# Patient Record
Sex: Female | Born: 1935 | Race: White | Hispanic: No | Marital: Married | Smoking: Never smoker
Health system: Southern US, Community
[De-identification: ages and names within clinical notes are randomized; demographics above are authoritative.]

## PROBLEM LIST (undated history)

## (undated) DIAGNOSIS — Z8679 Personal history of other diseases of the circulatory system: Secondary | ICD-10-CM

## (undated) DIAGNOSIS — E669 Obesity, unspecified: Secondary | ICD-10-CM

## (undated) DIAGNOSIS — R112 Nausea with vomiting, unspecified: Secondary | ICD-10-CM

## (undated) DIAGNOSIS — K219 Gastro-esophageal reflux disease without esophagitis: Secondary | ICD-10-CM

## (undated) DIAGNOSIS — I10 Essential (primary) hypertension: Secondary | ICD-10-CM

## (undated) DIAGNOSIS — R002 Palpitations: Secondary | ICD-10-CM

## (undated) DIAGNOSIS — E782 Mixed hyperlipidemia: Secondary | ICD-10-CM

## (undated) DIAGNOSIS — M1990 Unspecified osteoarthritis, unspecified site: Secondary | ICD-10-CM

## (undated) DIAGNOSIS — K589 Irritable bowel syndrome without diarrhea: Secondary | ICD-10-CM

## (undated) DIAGNOSIS — D708 Other neutropenia: Secondary | ICD-10-CM

## (undated) DIAGNOSIS — I679 Cerebrovascular disease, unspecified: Secondary | ICD-10-CM

## (undated) DIAGNOSIS — I2510 Atherosclerotic heart disease of native coronary artery without angina pectoris: Secondary | ICD-10-CM

## (undated) DIAGNOSIS — K5792 Diverticulitis of intestine, part unspecified, without perforation or abscess without bleeding: Secondary | ICD-10-CM

## (undated) DIAGNOSIS — I499 Cardiac arrhythmia, unspecified: Secondary | ICD-10-CM

## (undated) DIAGNOSIS — F419 Anxiety disorder, unspecified: Secondary | ICD-10-CM

## (undated) DIAGNOSIS — I4891 Unspecified atrial fibrillation: Secondary | ICD-10-CM

## (undated) DIAGNOSIS — Z9889 Other specified postprocedural states: Secondary | ICD-10-CM

## (undated) HISTORY — DX: Irritable bowel syndrome, unspecified: K58.9

## (undated) HISTORY — PX: CARPAL TUNNEL RELEASE: SHX101

## (undated) HISTORY — DX: Essential (primary) hypertension: I10

## (undated) HISTORY — DX: Unspecified atrial fibrillation: I48.91

## (undated) HISTORY — DX: Personal history of other diseases of the circulatory system: Z86.79

## (undated) HISTORY — PX: TOTAL KNEE ARTHROPLASTY: SHX125

## (undated) HISTORY — DX: Gastro-esophageal reflux disease without esophagitis: K21.9

## (undated) HISTORY — DX: Cerebrovascular disease, unspecified: I67.9

## (undated) HISTORY — PX: CORONARY ANGIOPLASTY: SHX604

## (undated) HISTORY — DX: Palpitations: R00.2

## (undated) HISTORY — DX: Anxiety disorder, unspecified: F41.9

## (undated) HISTORY — DX: Obesity, unspecified: E66.9

## (undated) HISTORY — PX: KNEE ARTHROSCOPY: SUR90

## (undated) HISTORY — PX: APPENDECTOMY: SHX54

## (undated) HISTORY — PX: OTHER SURGICAL HISTORY: SHX169

## (undated) HISTORY — DX: Mixed hyperlipidemia: E78.2

## (undated) HISTORY — PX: CHOLECYSTECTOMY: SHX55

## (undated) HISTORY — DX: Other neutropenia: D70.8

## (undated) HISTORY — DX: Unspecified osteoarthritis, unspecified site: M19.90

## (undated) HISTORY — DX: Atherosclerotic heart disease of native coronary artery without angina pectoris: I25.10

## (undated) HISTORY — PX: BACK SURGERY: SHX140

---

## 2004-03-26 ENCOUNTER — Ambulatory Visit: Payer: Self-pay | Admitting: Internal Medicine

## 2004-04-03 ENCOUNTER — Ambulatory Visit: Payer: Self-pay | Admitting: Internal Medicine

## 2004-04-14 ENCOUNTER — Ambulatory Visit (HOSPITAL_COMMUNITY): Admission: RE | Admit: 2004-04-14 | Discharge: 2004-04-14 | Payer: Self-pay | Admitting: Internal Medicine

## 2004-05-19 ENCOUNTER — Ambulatory Visit (HOSPITAL_COMMUNITY): Admission: RE | Admit: 2004-05-19 | Discharge: 2004-05-19 | Payer: Self-pay | Admitting: Internal Medicine

## 2004-07-20 ENCOUNTER — Ambulatory Visit: Payer: Self-pay | Admitting: Internal Medicine

## 2005-02-16 ENCOUNTER — Ambulatory Visit: Payer: Self-pay | Admitting: Internal Medicine

## 2005-04-22 ENCOUNTER — Ambulatory Visit: Payer: Self-pay | Admitting: Cardiology

## 2005-05-06 ENCOUNTER — Ambulatory Visit: Payer: Self-pay | Admitting: Cardiology

## 2005-07-12 ENCOUNTER — Ambulatory Visit: Payer: Self-pay | Admitting: Internal Medicine

## 2005-12-02 ENCOUNTER — Ambulatory Visit: Payer: Self-pay | Admitting: Internal Medicine

## 2005-12-13 ENCOUNTER — Encounter (INDEPENDENT_AMBULATORY_CARE_PROVIDER_SITE_OTHER): Payer: Self-pay | Admitting: *Deleted

## 2005-12-13 ENCOUNTER — Ambulatory Visit (HOSPITAL_COMMUNITY): Admission: RE | Admit: 2005-12-13 | Discharge: 2005-12-13 | Payer: Self-pay | Admitting: Internal Medicine

## 2005-12-13 ENCOUNTER — Ambulatory Visit: Payer: Self-pay | Admitting: Internal Medicine

## 2005-12-22 ENCOUNTER — Ambulatory Visit: Payer: Self-pay | Admitting: Internal Medicine

## 2006-01-19 ENCOUNTER — Ambulatory Visit: Payer: Self-pay | Admitting: Internal Medicine

## 2006-02-03 ENCOUNTER — Ambulatory Visit: Payer: Self-pay | Admitting: Internal Medicine

## 2006-06-24 ENCOUNTER — Ambulatory Visit: Payer: Self-pay | Admitting: Internal Medicine

## 2006-07-14 ENCOUNTER — Ambulatory Visit: Payer: Self-pay | Admitting: Cardiology

## 2006-12-08 ENCOUNTER — Ambulatory Visit: Payer: Self-pay | Admitting: Cardiology

## 2006-12-13 ENCOUNTER — Ambulatory Visit: Payer: Self-pay | Admitting: Cardiology

## 2007-01-01 ENCOUNTER — Ambulatory Visit: Payer: Self-pay | Admitting: Cardiology

## 2007-01-02 ENCOUNTER — Ambulatory Visit: Payer: Self-pay | Admitting: Cardiology

## 2007-01-10 ENCOUNTER — Ambulatory Visit: Payer: Self-pay | Admitting: Cardiology

## 2007-01-24 ENCOUNTER — Ambulatory Visit: Payer: Self-pay | Admitting: Cardiology

## 2007-02-08 ENCOUNTER — Ambulatory Visit: Payer: Self-pay | Admitting: Cardiology

## 2007-04-03 ENCOUNTER — Ambulatory Visit: Payer: Self-pay | Admitting: Cardiology

## 2007-10-30 ENCOUNTER — Ambulatory Visit: Payer: Self-pay | Admitting: Cardiology

## 2008-01-20 ENCOUNTER — Ambulatory Visit: Payer: Self-pay | Admitting: Cardiology

## 2008-02-07 ENCOUNTER — Ambulatory Visit: Payer: Self-pay | Admitting: Physician Assistant

## 2008-08-21 ENCOUNTER — Encounter: Payer: Self-pay | Admitting: Cardiology

## 2008-09-16 ENCOUNTER — Ambulatory Visit: Payer: Self-pay | Admitting: Cardiology

## 2008-11-27 ENCOUNTER — Encounter: Payer: Self-pay | Admitting: Cardiology

## 2008-11-28 ENCOUNTER — Encounter: Payer: Self-pay | Admitting: Cardiology

## 2009-04-18 ENCOUNTER — Encounter: Payer: Self-pay | Admitting: Cardiology

## 2009-04-21 ENCOUNTER — Ambulatory Visit: Payer: Self-pay | Admitting: Cardiology

## 2009-04-21 DIAGNOSIS — Z8719 Personal history of other diseases of the digestive system: Secondary | ICD-10-CM

## 2009-04-21 DIAGNOSIS — I10 Essential (primary) hypertension: Secondary | ICD-10-CM | POA: Insufficient documentation

## 2009-04-21 DIAGNOSIS — Z8679 Personal history of other diseases of the circulatory system: Secondary | ICD-10-CM | POA: Insufficient documentation

## 2009-04-21 DIAGNOSIS — Z9861 Coronary angioplasty status: Secondary | ICD-10-CM

## 2009-04-21 DIAGNOSIS — I2510 Atherosclerotic heart disease of native coronary artery without angina pectoris: Secondary | ICD-10-CM

## 2009-04-22 ENCOUNTER — Encounter: Payer: Self-pay | Admitting: Cardiology

## 2009-05-02 ENCOUNTER — Encounter: Payer: Self-pay | Admitting: Cardiology

## 2009-08-22 ENCOUNTER — Encounter: Payer: Self-pay | Admitting: Cardiology

## 2009-08-27 ENCOUNTER — Encounter: Payer: Self-pay | Admitting: Cardiology

## 2009-09-04 ENCOUNTER — Encounter (INDEPENDENT_AMBULATORY_CARE_PROVIDER_SITE_OTHER): Payer: Self-pay | Admitting: *Deleted

## 2009-12-29 ENCOUNTER — Encounter: Payer: Self-pay | Admitting: Cardiology

## 2010-02-10 ENCOUNTER — Ambulatory Visit: Payer: Self-pay | Admitting: Internal Medicine

## 2010-03-28 ENCOUNTER — Encounter (INDEPENDENT_AMBULATORY_CARE_PROVIDER_SITE_OTHER): Payer: Self-pay | Admitting: Internal Medicine

## 2010-03-29 ENCOUNTER — Encounter (INDEPENDENT_AMBULATORY_CARE_PROVIDER_SITE_OTHER): Payer: Self-pay | Admitting: Internal Medicine

## 2010-04-07 NOTE — Miscellaneous (Signed)
Summary: LIPID PANEL  Clinical Lists Changes  Orders: Added new Test order of T-Lipid Profile [id number]) - Signed

## 2010-04-07 NOTE — Assessment & Plan Note (Signed)
Summary: 6 MO FU PER JAN REMINDER-SRS   Visit Type:  Follow-up Primary Provider:  Dimas Aguas  CC:  follow-up visit.  History of Present Illness: the patient is a 77 year old female with a history of coronary artery disease, supraventricular tachycardia and nonobstructive cerebrovascular disease. The patient had abnormal Cardiolite in 2009 with an ejection fraction of 70%. The patient hasproblems with irritable bowel syndrome. She also has a large hiatal hernia.her symptoms have improved greatly with Nexium. She also states that she has no recurrent palpitations. She has been under significant stress last November 23, 2022 when her sister died. She was unable to go to the funeral because of significant anxiety. From a cardiovascular standpoint however the patient is stable. She denies any palpitations, chest pain shortness of breath orthopnea or PND.   Preventive Screening-Counseling & Management  Alcohol-Tobacco     Smoking Status: never  Current Medications (verified): 1)  Diazepam 5 Mg Tabs (Diazepam) .... Take 1 Tablet By Mouth Once A Day 2)  Lisinopril 40 Mg Tabs (Lisinopril) .... Take 1 Tablet By Mouth Once A Day 3)  Metoprolol Succinate 50 Mg Xr24h-Tab (Metoprolol Succinate) .... Take 1 Tablet By Mouth Once A Day 4)  Simvastatin 20 Mg Tabs (Simvastatin) .... Take 1 Tablet By Mouth At Bedtime 5)  Aspirin Ec 325 Mg Tbec (Aspirin) .... Take One Tablet By Mouth Daily 6)  Meclizine Hcl 25 Mg Tabs (Meclizine Hcl) .... Take 1/2-1 Tablet By Mouth Three Times A Day As Needed Dizziness 7)  Nexium 40 Mg Cpdr (Esomeprazole Magnesium) .... Take 1 Tablet By Mouth Once A Day 8)  Niacin 500 Mg Tabs (Niacin) .... Take 2 Tablet By Mouth Once A Day 9)  Dicyclomine Hcl 10 Mg Caps (Dicyclomine Hcl) .... Take 2 Tablet By Mouth Two Times A Day 10)  Tramadol Hcl 50 Mg Tabs (Tramadol Hcl) .... Take 1 Tablet By Mouth Three Times A Day  Allergies (verified): 1)  ! Codeine 2)  ! Morphine  Comments:  Nurse/Medical  Assistant: The patient's medications and allergies were reviewed with the patient and were updated in the Medication and Allergy Lists. Bottles reviewed.  Past History:  Past Medical History: Last updated: 04/24/2008 coronary artery disease A.  status post percutaneous intervention, March 1997 [facility]). B.  status-post coronary artery stenting, later in July, secondary to restenosis [facility]). History of supraventricular tachycardia palpitations mixed dyslipidemia nonobstructive cerebrovascular disease hypertension GERD chronic neutropenia chronic anxiety disorder osteoarthritis irritable bowel syndrome obesity  Past Surgical History: Last updated: 04/24/2008 Appendectomy Cholecystectomy history of right carpal tunnel, bilateral trigger thumb release surgery arthroscopic right knee surgery   Family History: Last updated: 04/24/2008 Family History of Coronary Artery Disease:   Social History: Last updated: 04/24/2008 Tobacco Use - No.  Alcohol Use - no  Risk Factors: Smoking Status: never (04/21/2009)  Review of Systems  The patient denies fatigue, malaise, fever, weight gain/loss, vision loss, decreased hearing, hoarseness, chest pain, palpitations, shortness of breath, prolonged cough, wheezing, sleep apnea, coughing up blood, abdominal pain, blood in stool, nausea, vomiting, diarrhea, heartburn, incontinence, blood in urine, muscle weakness, joint pain, leg swelling, rash, skin lesions, headache, fainting, dizziness, depression, anxiety, enlarged lymph nodes, easy bruising or bleeding, and environmental allergies.    Vital Signs:  Patient profile:   77 year old female Height:      66 inches Weight:      200 pounds BMI:     32.40 Pulse rate:   69 / minute BP sitting:   114 / 76  (left  arm) Cuff size:   regular  Vitals Entered By: Carlye Grippe (April 21, 2009 1:25 PM)  Nutrition Counseling: Patient's BMI is greater than 25 and therefore counseled on weight  management options. CC: follow-up visit   Physical Exam  Additional Exam:  General: Well-developed, well-nourished in no distress head: Normocephalic and atraumatic eyes PERRLA/EOMI intact, conjunctiva and lids normal nose: No deformity or lesions mouth normal dentition, normal posterior pharynx neck: Supple, no JVD.  No masses, thyromegaly or abnormal cervical nodes lungs: Normal breath sounds bilaterally without wheezing.  Normal percussion heart: regular rate and rhythm with normal S1 and S2, no S3 or S4.  PMI is normal.  No pathological murmurs abdomen: Normal bowel sounds, abdomen is soft and nontender without masses, organomegaly or hernias noted.  No hepatosplenomegaly musculoskeletal: Back normal, normal gait muscle strength and tone normal pulsus: Pulse is normal in all 4 extremities Extremities: No peripheral pitting edema neurologic: Alert and oriented x 3 skin: Intact without lesions or rashes cervical nodes: No significant adenopathy psychologic: Normal affect    Impression & Recommendations:  Problem # 1:  CAD (ICD-414.00) the patient has no recurrent chest pain.she had a stress test in 2009. Her updated medication list for this problem includes:    Lisinopril 40 Mg Tabs (Lisinopril) .Marland Kitchen... Take 1 tablet by mouth once a day    Metoprolol Succinate 50 Mg Xr24h-tab (Metoprolol succinate) .Marland Kitchen... Take 1 tablet by mouth once a day    Aspirin Ec 325 Mg Tbec (Aspirin) .Marland Kitchen... Take one tablet by mouth daily  Orders: EKG w/ Interpretation (93000) T-CBC No Diff ([id number]) T-Basic Metabolic Panel ([id number]) T-Lipid Profile ([id number]) T-TSH ([id number])  Problem # 2:  PALPITATIONS, HX OF (ICD-V12.50) patient reports no recurrent palpitations. I made no changes in her medical regimen.  Problem # 3:  ESSENTIAL HYPERTENSION, BENIGN (ICD-401.1) blood pressure is well controlled. Her updated medication list for this problem includes:    Lisinopril 40 Mg Tabs  (Lisinopril) .Marland Kitchen... Take 1 tablet by mouth once a day    Metoprolol Succinate 50 Mg Xr24h-tab (Metoprolol succinate) .Marland Kitchen... Take 1 tablet by mouth once a day    Aspirin Ec 325 Mg Tbec (Aspirin) .Marland Kitchen... Take one tablet by mouth daily  Problem # 4:  IRRITABLE BOWEL SYNDROME, HX OF (ICD-V12.79) Assessment: Comment Only  Patient Instructions: 1)  Labs  2)  Follow up in  1 year

## 2010-04-07 NOTE — Procedures (Signed)
Summary: Holter and Event/ CARDIONET END OF SERVICE SUMMARY REPORT  Holter and Event/ CARDIONET END OF SERVICE SUMMARY REPORT   Imported By: Dorise Hiss 04/21/2009 08:50:14  _____________________________________________________________________  External Attachment:    Type:   Image     Comment:   External Document

## 2010-04-07 NOTE — Miscellaneous (Signed)
Summary: ECG  Clinical Lists Changes  Observations: Added new observation of EKG INTERP: NSR 61 bpm. NSTT (04/21/2009 15:35)      EKG  Procedure date:  04/21/2009  Findings:      NSR 61 bpm. NSTT

## 2010-04-07 NOTE — Letter (Signed)
Summary: Engineer, materials at [facility]  [address]   [place], [place] [address]   Phone: [phone]  Fax: [phone]        September 04, 2009 MRN: [id number]   [facility] [place] [address] [place], [place]  [address]   Dear Ms. Dever,  Your test ordered by Selena Batten has been reviewed by your physician (or physician assistant) and was found to be normal or stable. Your physician (or physician assistant) felt no changes were needed at this time.  ____ Echocardiogram  ____ Cardiac Stress Test  __X__ Lab Work  ____ Peripheral vascular study of arms, legs or neck  ____ CT scan or X-ray  ____ Lung or Breathing test  ____ Other:   Thank you.   Hoover Brunette, LPN    Duane Boston, M.D., F.A.C.C. Thressa Sheller, M.D., F.A.C.C. Oneal Grout, M.D., F.A.C.C. Cheree Ditto, M.D., F.A.C.C. Daiva Nakayama, M.D., F.A.C.C. Kenney Houseman, M.D., F.A.C.C. Jeanne Ivan, PA-C

## 2010-04-07 NOTE — Letter (Signed)
Summary: External Correspondence/ OFFICE NOTE DR. HOWARD  External Correspondence/ OFFICE NOTE DR. HOWARD   Imported By: Dorise Hiss 08/26/2009 09:31:50  _____________________________________________________________________  External Attachment:    Type:   Image     Comment:   External Document

## 2010-04-07 NOTE — Letter (Signed)
Summary: External Correspondence/ OFFICE VISIT [facility]  External Correspondence/ OFFICE VISIT [facility]   Imported By: Dorise Hiss 01/02/2010 14:48:02  _____________________________________________________________________  External Attachment:    Type:   Image     Comment:   External Document

## 2010-07-21 NOTE — Assessment & Plan Note (Signed)
[facility]                          [facility] OFFICE NOTE   Dominique Matthews, Dominique Matthews                       MRN:          [id number]  DATE:07/14/2006                            DOB:          09/15/1929    REFERRING PHYSICIAN:  Selinda Flavin   HISTORY OF PRESENT ILLNESS:  The patient is a 77 year old female with a  history of coronary artery disease, a former patient of Dr. Dale [place].  The patient has been doing quite well from a cardiovascular  perspective.  She reports no chest pain, shortness of breath, orthopnea  or PND.  She has had no palpitations or syncope.   Her main concern today in the office focuses about a out of thrush and  possible esophagitis that she had several weeks ago.  She states this  has been somewhat of a recurrent problem.  She is also concerned about  her neutropenia on her previous laboratory work.  In addition, the  patient has asked me questions about her recent LDL cholesterol of 100  and is wanting to go back on a cholesterol-lowering medication.   CURRENT MEDICATIONS:  1. Enteric-coated aspirin 325 mg q.d.  2. Atenolol 25 mg p.o. b.i.d.  3. Diltiazem XR 120 mg p.o. q.d.  4. Diazepam 5 mg p.o. b.i.d. p.r.n.  5. Multivitamin.  6. __________ 0.025 to 0.5 mg 1 tab t.i.d.  7. Prilosec.  8. Trazodone.  9. Detrol LA 4 mg p.o. q.d.   PHYSICAL EXAMINATION:  VITAL SIGNS:  Blood pressure 154/98, heart rate  74 beats per minute, weight 212 pounds.  NECK:  Normal carotid upstrokes.  No carotid bruits.  LUNGS:  Clear breath sounds bilaterally.  HEART:  A regular rate and rhythm.  Normal sinus rhythm.  No murmurs,  rubs or gallops.  ABDOMEN:  Soft, nontender, with no rebound tenderness or guarding.  Good  bowel sounds.  EXTREMITIES:  No clubbing, cyanosis or edema.  NEUROLOGIC:  The patient is alert and oriented, grossly nonfocal.   PROBLEMS:  1. Coronary artery disease      a.     Status post stent in  September 1997.      b.     No recurrence of chest pain, with negative Cardiolite study       in 2007.  2. Dyslipidemia.  3. Palpitations, stable.  4. Hypertension.  5. Neutropenia.   PLAN:  1. The patient is very anxious about her neutropenia, particularly in      light of her recurrent thrush infections.  I have told her that it      is certainly not unreasonable to request a consultation from      hematology.  I referred the patient to Dr. Lynett Fish.  2. The patient also remains extremely anxious about her lipid panel      and although there is no right or wrong answer at this point with      her LDL of 100, I did tell her that it would be okay with me to go      on simvastatin 10  mg p.o. q.h.s.  3. The patient has been advised to follow up with Dr. Selinda Flavin      regarding her blood pressure.  She was not hypertensive during the      last office visit and I do not want to act immediately on one blood      pressure reading, but this should be checked fairly soon.  4. The patient will follow up with [place] in six months.     Learta Codding, MD,FACC  Electronically Signed    GED/MedQ  DD: 07/14/2006  DT: 07/14/2006  Job #: [id number]   cc:   Selinda Flavin

## 2010-07-21 NOTE — Assessment & Plan Note (Signed)
[facility]                          [facility] OFFICE NOTE   EDWENA, MAYORGA                       MRN:          [id number]  DATE:01/02/2007                            DOB:          Feb 12, 1930    REASON FOR VISIT:  Post hospital followup.   HISTORY OF PRESENT ILLNESS:  Ms. Pha is a 77 year old female patient  with a history of coronary artery disease, status post angioplasty in  1997, and stenting in 2007, at [facility] with Dr. Adella Hare.  She was recently seen at [facility] on December 08, 2006, with  palpitations.  She was noted to have ventricular trigeminy on telemetry.  She also apparently has a history of supraventricular tachycardia.  She  was assessed with a 2-D echocardiogram.  This revealed normal LV  function with an EF of 60-65% and no significant valvular abnormalities.  She was discharged to home with plans for outpatient Cardiolite testing  and event monitor.   The patient underwent a stress Cardiolite test on December 13, 2006.  She  achieved 7 METS and exercised for 4 minutes and 41 seconds.  She had no  EKG changes.  She had normal LV perfusion on her images.  Her 30-day  CardioNet event monitor strips have been reviewed.  This basically  reveals sinus rhythm, sinus bradycardia, first-degree AV block and rare  PVCs.  She was symptomatic with one of these PVCs and called those  symptoms in.  Of note, in the hospital, the patient's electrolytes were  normal with potassium of 3.9, creatinine was 0.7.  Her BNP was low at 82  and TSH was normal at 2.43 and her hemoglobin was normal at 13.8.   In the office today, she notes she is doing well.  She continues to have  palpitations from time to time.  She has some concerns that she has been  taken off Diltiazem.  Upon review of her CardioNet monitor strips, her  rates have been in the 50s the majority of the time, just on atenolol 25  mg twice a day.  She denies  any chest heaviness or tightness.  Denies  any significant dyspnea.  She describes NYHA Class II symptoms.  Denies  orthopnea, PND or pedal edema.  Denies any syncope or near syncope.   She does note to me today some fatigue.  She also notes that she does  snore.  She has daytime hypersomnolence.   CURRENT MEDICATIONS:  1. Aspirin 325 mg daily.  2. Atenolol 25 mg b.i.d.  3. Multivitamin daily.  4. Prilosec 20 mg daily.  5. Trazodone 50 mg half tablet b.i.d.  6. Detrol LA 4 mg daily.  7. Diazepam 2.5 mg b.i.d.  8. Simvastatin 20 mg nightly.   ALLERGIES:  MORPHINE AND CODEINE.   PHYSICAL EXAMINATION:  GENERAL:  Well-developed, well-nourished female.  VITAL SIGNS:  Blood pressure 156/94, pulse 65, weight 214.2 pounds.  HEENT:  Normal.  NECK:  Without JVD.  CARDIAC:  S1, S2, regular rate and rhythm without murmurs.  LUNGS:  Clear to auscultation bilaterally without wheezes, rhonchi or  rales.  ABDOMEN:  Soft, nontender with no organomegaly.  EXTREMITIES:  Without edema.  Calves soft and nontender.  NEUROLOGIC:  Alert and oriented x3.  Cranial nerves 2-12 grossly intact.   IMPRESSION:  1. Palpitations, probable symptomatic premature ventricular      contractions.  2. Fatigue, rule out sleep apnea.  3. Coronary artery disease, status post balloon angioplasty in 1997,      followed by coronary stenting in July 2008, secondary to restenosis      at [facility].  4. Nonischemic stress Cardiolite in October 2008.  5. Preserved left ventricular function.  6. History of supraventricular tachycardia.  7. Hypertension.  8. Dyslipidemia.  9. Anxiety.  10.History of leukopenia, evaluated by Dr. Ubaldo Glassing.  11.History of nonobstructive carotid disease by carotid Dopplers in      2005.  12.Gastroesophageal reflux disease.   PLAN:  The patient presents to the office today for followup.  She does  continue to have palpitations.  She does have bradycardia just on   atenolol 25 mg twice a day.  At this point in time, we plan the  following:  1. Discontinue her atenolol and switch her to a different beta-      blocker.  I will start her on Toprol XL 50 mg daily.  2. Initiate lisinopril 10 mg daily for better blood pressure control.      I do not think we should reinitiate her Diltiazem.  She is      bradycardic just on a beta-blocker.  Apparently, she had some low      heart rates in the hospital as well.  3. She will need a followup blood pressure check with the nurse in 7-      10 days as well as a followup BMET.  We will obtain magnesium at      that time.  4. She will be set up for a home apnea link test to rule out sleep      apnea.  5. She will be brought back in followup in the next 3 months or sooner      p.r.n.      Tereso Newcomer, PA-C  Electronically Signed      Learta Codding, MD,FACC  Electronically Signed   SW/MedQ  DD: 01/02/2007  DT: 01/03/2007  Job #: [id number]   cc:   Selinda Flavin

## 2010-07-21 NOTE — Assessment & Plan Note (Signed)
[facility]                          [facility] OFFICE NOTE   BRESHA, HOSACK                       MRN:          [id number]  DATE:04/03/2007                            DOB:          1930/02/22    PRIMARY CARDIOLOGIST:  Learta Codding, MD, [facility]   PRIMARY CARE PHYSICIAN:  Selinda Flavin, MD   REASON FOR VISIT:  Three month followup.   HISTORY OF PRESENT ILLNESS:  Dominique Matthews is a 77 year old female patient  with history of coronary disease and palpitations that have been thought  to be secondary to symptomatic PVCs who presents to the office today for  follow-up.  At last visit, I switched her from atenolol to Toprol and  also initiated lisinopril for better blood pressure control.  Since that  time, she has had hydrochlorothiazide added for better blood pressure  control.  Followup BMETS have been fairly unremarkable.  She has had her  lipid therapy adjusted and her most recent lipid panel revealed a  triglyceride level of 327, total cholesterol 181, HDL 48 and LDL 68.   In the office day, the patient notes she is doing well.  She denies any  chest pain or significant shortness of breath.  Denies any orthopnea,  PND or pedal edema.  She has had significant dizziness.  Of note, she is  currently seeing Dr. Ninetta Lights.  She was referred to him a couple of  months ago by [place] for formal sleep study evaluation to rule out sleep  apnea.  She apparently has vertigo and is undergoing treatment with  prednisone at this point in time per Dr. Rivka Barbara direction. Her  palpitations are less frequent but when she does get them they are quite  significant for her.  She denies any elevated caffeine intake.   CURRENT MEDICATIONS:  1. Fish oil 2 grams daily.  2. Co-enzyme Q 10.  3. Lisinopril/hydrochlorothiazide 20/12.5 mg daily.  4. Metoprolol succinate 50 mg daily.  5. Diazepam 5 mg b.i.d.  6. Simvastatin 40 mg nightly.  7. Prednisone.  8. Meclizine  p.r.n.  9. Aspirin 325 mg daily.  10.Multivitamin daily.  11.Prilosec 20 mg daily.   ALLERGIES:  MORPHINE, CODEINE.   PHYSICAL EXAM:  She is a well-nourished, well-developed female in no  acute distress. Blood pressures 121/75, pulse 74, weight 210.8 pounds.  HEENT is normal.  NECK: No JVD.  CARDIAC:  Normal S1, S2. Regular rate and rhythm without murmur.  LUNGS:  Clear to auscultation bilaterally without wheezing, rhonchi or  rales.  ABDOMEN: Soft. Nontender with normoactive bowel sounds. No organomegaly.  EXTREMITIES: Without edema.  Calves soft, nontender.  SKIN: Warm and dry.  NEUROLOGIC:  She is alert and orient x3.  Cranial nerves II-XII grossly  intact.   Oxygen saturation 97% room air.   IMPRESSION:  1. Palpitations.      a.     Symptomatic PVCs.  2. Fatigue.      a.     Abnormal home Apnea Link test - She has been referred to Dr.       Ninetta Lights for workup  of sleep apnea.  3. Dizziness.      a.     Currently followed by Dr. Ninetta Lights for possible vertigo.  4. Coronary artery disease, status post percutaneous coronary      intervention in 1997 followed by stenting in July of 2008 secondary      to in-stent re-stenosis at [facility].      a.     Nonischemic stress Cardiolite October 2008.  5. Good left ventricular function.  6. History of supraventricular tachycardia.  7. Hypertension.  8. Dyslipidemia.      a.     Elevated triglycerides  9. Anxiety.  10.History of leukopenia, evaluated by Dr. Ubaldo Glassing.  11.History of nonobstructive carotid disease by carotid Dopplers 2005.  12.Gastroesophageal reflux disease.   PLAN:  The patient presents to the office today for follow-up.  Overall,  she is doing well.  She is currently followed by Dr. Ninetta Lights for  dizziness - questionable vertigo.  She does continue to have  palpitations from time to time but they are less severe.  She does have  recent lipid panel that reveals a fairly optimal HDL, optimal  LDL, but  elevated triglycerides.  At this point in time, we plan to:  1. Initiate niacin 500 mg nightly x4 weeks then increase to 1000 mg      nightly.  She has been advised to take her aspirin 30 minutes prior      to dosing.  Will we will recheck lipids, LFTs in about 12 weeks.  2. I have asked her to take metoprolol 50 mg 1/2 tablet daily p.r.n.      recurrent palpitations for symptomatic relief.  3. We will recheck a BMET today to follow-up her renal function,      potassium given ACE      inhibitor/diuretic therapy.  4. She will follow-up in 6 months or sooner p.r.n.      Tereso Newcomer, PA-C  Electronically Signed      Learta Codding, MD,FACC  Electronically Signed   SW/MedQ  DD: 04/03/2007  DT: 04/03/2007  Job #: [id number]   cc:   Selinda Flavin

## 2010-07-21 NOTE — Assessment & Plan Note (Signed)
[facility]                          [facility]   Dominique Matthews, Dominique Matthews                       MRN:          [id number]  DATE:09/16/2008                            DOB:          [birth year]    REFERRING PHYSICIAN:  Selinda Flavin   HISTORY OF PRESENT ILLNESS:  The patient is a 77 year old female with a  history of coronary artery disease and percutaneous coronary  intervention in 1997.  The patient has a normal Cardiolite study,  however, a year ago.  The patient was recently seen by Dr. Arletha Grippe  and on plain x-rays saw aortic calcification and he thought that the  patient had an aortic aneurysm.  However, CT scan did not show any  aortic aneurysm.  The patient does have vascular calcification.  She  also has a large paraesophageal hernia.  The patient denies any chest  pain.  The patient does feel full after eating, which likely related to  her hernia.  She occasionally also has skipping and fluttering of her  heart.  She denies any orthopnea, PND, or syncope.  She does experience  significant flushing after she takes niacin.   MEDICATIONS:  1. Metoprolol ER 50 mg p.o. daily.  2. Lisinopril 40 mg p.o. daily.  3. Prilosec 20 mg p.o. q.a.m.  4. Simvastatin 20 mg p.o. nightly.  5. Multivitamin daily.  6. Diazepam 5 mg half-tablet p.o. b.i.d.  7. Niacin 5 mg p.o. daily.  8. Fish oil 1200 mg p.o. b.i.d.  9. Dicyclomine 10 mg 2 tablets p.o. b.i.d.  10.Aspirin 81 mg p.o. daily.   PHYSICAL EXAMINATION:  VITAL SIGNS:  Blood pressure 133/86, heart rate  is 75, and weight 204.  GENERAL:  Well-nourished white female in no apparent distress.  HEENT:  Pupils, eyes are equal.  Conjunctivae clear.  NECK:  Supple.  Normal carotid upstroke.  No carotid bruits.  Unclear  breath sounds bilaterally.  HEART:  Regular rate and rhythm.  Normal S1 and S2.  No murmur, rubs, or  gallops.  ABDOMEN:  Soft, nontender.  No rebound or guarding.  Good bowel  sounds.  EXTREMITIES:  No cyanosis, clubbing, or edema.  NEUROLOGIC:  The patient is alert and oriented.  Grossly nonfocal.   PROBLEM LIST:  1. Coronary artery disease, stable.      a.     Normal adenosine Cardiolite study.  Ejection fraction 70%.      b.     Status post percutaneous coronary intervention in 1997.  2. History of supraventricular tachycardia.  3. Dyslipidemia.  4. History of nonobstructive cerebrovascular disease.  5. Hypertension.  6. Neutropenia followed by Dr. Bing Matter.  7. Gastroesophageal reflux disease and large paraesophageal hernia.  8. Palpitation.   PLAN:  1. I told the patient that she has palpitations to take half an extra      of Toprol-XL.  2. I also asked her to increase her aspirin to 325 to take before her      niacin, given her significant flushing.  3. I do not think the patient needs any further ischemia testing at  the present time.  The patient can continue on her current medical      regimen.     Learta Codding, MD,FACC  Electronically Signed    GED/MedQ  DD: 09/16/2008  DT: 09/17/2008  Job #: [id number]   cc:   Selinda Flavin

## 2010-07-21 NOTE — Assessment & Plan Note (Signed)
[facility]                          [facility]   KAMAREE, BERKEL                       MRN:          [id number]  DATE:10/30/2007                            DOB:          02/29/1930    REFERRING PHYSICIAN:  Dr. Selinda Flavin   HISTORY OF PRESENT ILLNESS:  The patient is a 77 year old female with  history of coronary artery disease and palpitations.  From coronary  artery disease perspective, the patient is doing well.  She has no chest  pain, shortness of breath, orthopnea, or PND.  She has no palpitations  or syncope.  We reviewed her laboratory work today in the office  including her slightly elevated total bilirubin and indirect bilirubin.  Her triglycerides were 255, cholesterol was within goal with an LDL of  84 mg percent.   MEDICATIONS:  1. Fish oil.  2. Coenzyme Q10.  3. Lisinopril.  4. Hydrochlorothiazide.  5. Metoprolol.  6. Simvastatin.  7. Niacin.  8. Enteric-coated aspirin.  9. Multivitamin.  10.Omeprazole.  11.Diazepam.  12.Nabumetone.   PHYSICAL EXAMINATION:  VITAL SIGNS:  Blood pressure is 126/84, heart  rate 75, weight is 270 pounds.  NECK:  Normal carotid upstroke.  No carotid bruits.  LUNGS:  Clear breath sounds bilaterally.  HEART:  Regular rate and rhythm.  Normal S1 and S2.  No pathological  murmurs.  ABDOMEN:  Soft.  EXTREMITIES:  No cyanosis, clubbing, or edema.  NEURO:  The patient is alert, oriented, and grossly nonfocal.   PROBLEM LIST:  1. History of palpitations, quiescent.  2. Coronary artery disease status post percutaneous coronary      intervention, stable.  3. Nonischemic Cardiolite in 2008.  4. Normal left ventricular function.  5. History of anxiety.  6. History of nonobstructive carotid disease by carotid Doppler 2005.  7. Gastroesophageal reflux disease.   PLAN:  1. The patient from coronary artery disease perspective is doing well.      She does not need any repeat  stress testing at this point in time.  2. The patient still complains of some flushing and could not tolerate      2000 mg of niacin as it manifested her slightly elevated liver      function tests.  I recommend this patient go to niacin at 1000 mg      p.o. daily as her overall lipid panel is acceptable.  3. We will redraw LFTs and lipids in 6 weeks.     Learta Codding, MD,FACC  Electronically Signed    GED/MedQ  DD: 10/30/2007  DT: 10/30/2007  Job #: [id number]   cc:   Selinda Flavin

## 2010-07-21 NOTE — Assessment & Plan Note (Signed)
[facility]                          [facility]   Dominique Matthews, Dominique Matthews                       MRN:          [id number]  DATE:02/07/2008                            DOB:          28-Mar-1929    PRIMARY CARDIOLOGIST:  Learta Codding, MD, [facility]   REASON FOR VISIT:  Post hospital followup.   Dominique Matthews is a pleasant 77 year old female, well known to [place], who was  recently briefly hospitalized here at [facility] with chest pain.  She was  referred to [place] in consultation, ruled out for myocardial infarction with  all serial cardiac markers within normal limits, and referred for in-  house noninvasive workup.  She had both a 2-D echocardiogram and an  adenosine Cardiolite, both of which were within normal limits.   Dominique Matthews has not had any recurrent chest pain since her recent  hospitalization.  Her only medication adjustment was of substitution of  lisinopril HCT with lisinopril 40 mg daily.  She currently needs refills  for both simvastatin and Toprol.   CURRENT MEDICATIONS:  1. Metoprolol ER 50 daily.  2. Lisinopril 40 daily.  3. Prilosec.  4. Simvastatin 20 nightly.  5. Niacin 1000 daily.  6. Aspirin 325 daily.  7. Fish oil 2000 daily.  8. Diazepam 5 b.i.d.   PHYSICAL EXAMINATION:  VITAL SIGNS:  Blood pressure 132/82, pulse 68,  regular weight 211.  GENERAL:  A 77 year old female sitting upright in no distress.  HEENT:  Normocephalic, atraumatic.  NECK:  Palpable bilateral carotid pulses without bruits.  No JVD at 90  degrees.  LUNGS:  Clear to auscultation in all fields.  HEART:  Regular rate and rhythm.  No significant murmurs.  No rubs.  ABDOMEN:  Benign.  EXTREMITIES:  No significant edema.  NEUROLOGIC:  Flat affect, but no focal deficit.   IMPRESSION:  1. Coronary artery disease, stable.      a.     Normal adenosine Cardiolite; EF 70%.      b.     Status post percutaneous intervention in 1997 [facility]).  2. History of  supraventricular tachycardia.  3. Mixed dyslipidemia.      a.     Intolerant to maximum dose of niacin.  4. History of nonobstructive cerebrovascular disease.  5. Hypertension.  6. Neutropenia.  7. Gastroesophageal reflux disease.   PLAN:  1. Continue current medication regimen, except with down titration of      aspirin from full dose to 81 mg daily.  We will also renew her      prescriptions for long-acting metoprolol and simvastatin, at the      current doses.  2. Recommend surveillance of fasting lipid/liver profile in 6 months.  3. Schedule return clinic followup with myself and Dr. Andee Lineman in 6      months, or sooner as needed.      Rozell Searing, PA-C  Electronically Signed      Jonelle Sidle, MD  Electronically Signed   GS/MedQ  DD: 02/07/2008  DT: 02/08/2008  Job #: [id number]   cc:   Selinda Flavin

## 2010-07-24 NOTE — Op Note (Signed)
NAMELORILEI, HORAN                ACCOUNT NO.:  [id number]   MEDICAL RECORD NO.:  [id number]          PATIENT TYPE:  AMB   LOCATION:  DAY                           FACILITY:  [facility]   PHYSICIAN:  Lionel December, M.D.    DATE OF BIRTH:  1929/03/28   DATE OF PROCEDURE:  12/13/2005  DATE OF DISCHARGE:                                 OPERATIVE REPORT   PROCEDURE:  Esophagogastroduodenoscopy followed by colonoscopy.   INDICATIONS FOR PROCEDURE:  The patient is a 77 year old Caucasian female  with chronic diarrhea and abdominal pain who has undergone extensive  evaluation and found to have IBS but failed to respond to therapy.  She was  recently seen in the office, given an empiric trial with Surfak without any  benefit.  Small bowel study is normal.  She is undergoing diagnostic  colonoscopy.  She experienced a couple of days of melena since she was seen  in the office and is, therefore, undergoing diagnostic EGD, as well.  She  has chronic GERD and is maintained on a PPI.  The procedure risks were  reviewed with the patient and informed consent was obtained.   MEDICATIONS FOR CONSCIOUS SEDATION:  Benzocaine spray for pharyngeal topical  anesthesia, Demerol 50 mg IV, Versed 8 mg IV.   FINDINGS:  The procedure is performed in the endoscopy suite.  The patient's  vital signs and O2 saturations were monitored during the procedure and  remained stable.   DESCRIPTION OF PROCEDURE:  Esophagogastroduodenoscopy:  The patient was placed in the left lateral  position and the Olympus videoscope was passed via oropharynx without any  difficulty into the esophagus.   Esophagus:  The mucosa of the esophagus was normal.  The body was tortuous.  The GE junction was at 30 cm from the incisors.  The hiatus was at 38 and  was wide open.  There was a large dependent segment hernia on the left side.  There was erythema to the mucosa of the hernia part of the stomach but no  erosions or ulcers were  noted.   Stomach:  The stomach was empty and distended very well with insufflation.  The folds of the proximal stomach were normal.  Examination of the mucosa  friable antral folds with nodularity of mucosa.  The pyloric channel was  patent.  The angularis, fundus, and cardia were examined by retroflexion of  the scope and were normal.  Hiatal hernia was easily seen on this view.   Duodenum and bulbar mucosa was normal.  The scope was passed to the second  part of the duodenum where the mucosa and folds were normal.  The endoscope  was withdrawn.  A biopsy was taken from the swollen anterior folds.  The  patient was prepared for procedure number 2.   Colonoscopy:  Rectal examination performed.  No abnormality noted on  external or digital exam.  The Olympus videoscope was placed in the rectum  and advanced under vision in the sigmoid colon and beyond.  The preparation  was satisfactory.  Scattered diverticula were noted at the sigmoid colon and  a few more at the descending colon.  There appeared to be mucosal edema of  the sigmoid colon to a segment of sigmoid colon but no erosions or ulcers  were noted.  No stricture was noted, either.  The scope was passed to the  cecum which was identified by the appendiceal orifice and ileocecal valve  which was lipomatous but the mucosa was normal.  As the scope was withdrawn,  the colonic mucosa was examined for the second time.  Random biopsies were  taken from the transverse colon and also from the sigmoid colon where the  mucosa appeared to be erythematous.  The rectal mucosa was normal.  The  scope was retroflexed to examine the anorectal junction.  Hemorrhoids were  noted below the dentate line along with small anal papilla.  The endoscope  was straightened and withdrawn.  The patient tolerated the procedure well.   FINAL DIAGNOSIS:  1. Moderate to large sliding hiatal hernia with some mucosal inflammation      of the hernia and part of the  stomach but no frank ulceration or      erosions.  2. Markedly swollen friable antral/prepyloric folds, biopsied for      histology, this may be the source of her recent melena.  3. Sigmoid colon diverticulosis.  4. Nonspecific finding of mucosal edema at the sigmoid, biopsy taken from      the sigmoid colon as well as the transverse colon.  5. Small external hemorrhoids.   RECOMMENDATIONS:  1. She will continue anti-reflux measures and Prilosec for the time being.  2. H. pylori serology will be checked.  3. I will be contacting the patient for results of the biopsy and further      recommendation.  Serology will be checked along with a CBC.      Lionel December, M.D.  Electronically Signed     NR/MEDQ  D:  12/13/2005  T:  12/14/2005  Job:  [id number]   cc:   Selinda Flavin  Fax: [phone]

## 2010-11-12 ENCOUNTER — Encounter: Payer: Self-pay | Admitting: Cardiology

## 2010-11-16 ENCOUNTER — Encounter: Payer: Self-pay | Admitting: Cardiology

## 2010-11-16 ENCOUNTER — Ambulatory Visit (INDEPENDENT_AMBULATORY_CARE_PROVIDER_SITE_OTHER): Payer: Medicare Other | Admitting: Cardiology

## 2010-11-16 VITALS — BP 132/83 | HR 61 | Ht 66.0 in | Wt 214.0 lb

## 2010-11-16 DIAGNOSIS — I2510 Atherosclerotic heart disease of native coronary artery without angina pectoris: Secondary | ICD-10-CM

## 2010-11-16 DIAGNOSIS — E785 Hyperlipidemia, unspecified: Secondary | ICD-10-CM

## 2010-11-16 DIAGNOSIS — Z8679 Personal history of other diseases of the circulatory system: Secondary | ICD-10-CM

## 2010-11-16 DIAGNOSIS — I10 Essential (primary) hypertension: Secondary | ICD-10-CM

## 2010-11-16 NOTE — Assessment & Plan Note (Signed)
She has no new symptoms.  No change in therapy is indicated.

## 2010-11-16 NOTE — Assessment & Plan Note (Signed)
Her LDL last week was 87 with an HDL of 52.  This is at target.  No change to her therapy is indicated.  I reviewed these labs today and reviewed them with the patient.

## 2010-11-16 NOTE — Assessment & Plan Note (Signed)
The blood pressure is at target. No change in medications is indicated. We will continue with therapeutic lifestyle changes (TLC).  

## 2010-11-16 NOTE — Assessment & Plan Note (Signed)
She had PCI in 1997 by previous records.  Her last stress test was 2009. She has had no new symptoms.  She will continue with risk reduction.  No further testing is indicated at this time.

## 2010-11-16 NOTE — Patient Instructions (Signed)
Your physician you to follow up in 1 year. You will receive a reminder letter in the mail one-two months in advance. If you don't receive a letter, please call our office to schedule the follow-up appointment. Your physician recommends that you continue on your current medications as directed. Please refer to the Current Medication list given to you today. 

## 2010-11-16 NOTE — Progress Notes (Signed)
HPI The patient presents for followup predominantly of palpitations. She was in the emergency room prior to her last visit with me. However, she was under incredible stress because of the death of her sister. Since that time she has rare palpitations and no sustained episodes with no presyncope or syncope. She denies any chest pressure, neck or arm discomfort. She doesn't had shortness of breath, PND or orthopnea. She has no weight gain or edema. She is somewhat limited in her activities by joint pains but she does some light housekeeping.  Allergies  Allergen Reactions  . Codeine     REACTION: sweating  . Morphine     REACTION: sweating    Current Outpatient Prescriptions  Medication Sig Dispense Refill  . aspirin 81 MG tablet Take 81 mg by mouth daily.        . Cholecalciferol (D3-1000) 1000 UNITS capsule Take 1,000 Units by mouth daily.        . diazepam (VALIUM) 5 MG tablet Take 5 mg by mouth at bedtime.       . diclofenac (VOLTAREN) 75 MG EC tablet Take 75 mg by mouth as directed.        . dicyclomine (BENTYL) 10 MG capsule Take 20 mg by mouth 2 (two) times daily.        Marland Kitchen esomeprazole (NEXIUM) 40 MG capsule Take 40 mg by mouth daily.        Marland Kitchen gabapentin (NEURONTIN) 300 MG capsule Take 300 mg by mouth 3 (three) times daily.        Marland Kitchen HYDROcodone-acetaminophen (VICODIN) 5-500 MG per tablet Take 1 tablet by mouth daily.        Marland Kitchen lisinopril (PRINIVIL,ZESTRIL) 40 MG tablet Take 40 mg by mouth daily.        . meclizine (ANTIVERT) 25 MG tablet Take 25 mg by mouth 3 (three) times daily as needed.        . metoprolol (TOPROL-XL) 50 MG 24 hr tablet Take 50 mg by mouth daily.        . Multiple Vitamin (MULTIVITAMIN) tablet Take 1 tablet by mouth daily.        . niacin 500 MG tablet Take 1,000 mg by mouth daily with breakfast.        . Omega-3 Fatty Acids (FISH OIL) 1000 MG CAPS Take 1 capsule by mouth daily.        . simvastatin (ZOCOR) 20 MG tablet Take 20 mg by mouth at bedtime.           Past Medical History  Diagnosis Date  . CAD (coronary artery disease)   . History of supraventricular tachycardia   . Palpitations   . Mixed dyslipidemia   . Cerebrovascular disease     Nonobstructive  . Hypertension   . GERD (gastroesophageal reflux disease)   . Chronic benign neutropenia   . Chronic anxiety   . Osteoarthritis   . IBS (irritable bowel syndrome)   . Obesity     Past Surgical History  Procedure Date  . Appendectomy   . Cholecystectomy   . Carpal tunnel release     Right; Bilateral trigger thumb release surgery  . Knee arthroscopy     Right    ROS:  Hiatal hernia.  Otherwise as stated in the HPI and negative for all other systems.  PHYSICAL EXAM BP 132/83  Pulse 61  Ht 5\' 6"  (1.676 m)  Wt 214 lb (97.07 kg)  BMI 34.54 kg/m2 GENERAL:  Well appearing HEENT:  Pupils equal round and reactive, fundi not visualized, oral mucosa unremarkable, dentures NECK:  No jugular venous distention, waveform within normal limits, carotid upstroke brisk and symmetric, no bruits, no thyromegaly LYMPHATICS:  No cervical, inguinal adenopathy LUNGS:  Clear to auscultation bilaterally BACK:  No CVA tenderness CHEST:  Unremarkable HEART:  PMI not displaced or sustained,S1 and S2 within normal limits, no S3, no S4, no clicks, no rubs, no murmurs ABD:  Flat, positive bowel sounds normal in frequency in pitch, no bruits, no rebound, no guarding, no midline pulsatile mass, no hepatomegaly, no splenomegaly EXT:  2 plus pulses throughout, no edema, no cyanosis no clubbing SKIN:  No rashes no nodules NEURO:  Cranial nerves II through XII grossly intact, motor grossly intact throughout PSYCH:  Cognitively intact, oriented to person place and time  EKG:  Sinus rhythm, rate 67, axis within normal limits, intervals within normal limits, no acute ST-T wave changes.   ASSESSMENT AND PLAN

## 2011-02-15 ENCOUNTER — Ambulatory Visit (INDEPENDENT_AMBULATORY_CARE_PROVIDER_SITE_OTHER): Payer: Medicare Other | Admitting: Internal Medicine

## 2011-02-15 ENCOUNTER — Encounter (INDEPENDENT_AMBULATORY_CARE_PROVIDER_SITE_OTHER): Payer: Self-pay | Admitting: Internal Medicine

## 2011-02-15 VITALS — BP 130/80 | HR 76 | Temp 98.2°F | Ht 67.0 in | Wt 214.0 lb

## 2011-02-15 DIAGNOSIS — K5792 Diverticulitis of intestine, part unspecified, without perforation or abscess without bleeding: Secondary | ICD-10-CM

## 2011-02-15 DIAGNOSIS — K5732 Diverticulitis of large intestine without perforation or abscess without bleeding: Secondary | ICD-10-CM

## 2011-02-15 NOTE — Patient Instructions (Signed)
/  u one year

## 2011-02-15 NOTE — Progress Notes (Signed)
Subjective:     Patient ID: Dominique Matthews, female   DOB: 03-31-1929, 77 y.o.   MRN: [id number]  HPI Dominique Matthews is a 77 yr old female here for a scheduled visit. She tells me she feels pretty good. She does have trouble with her hand and knee.  She was referred to Dr. Gabriel Cirri 2 yrs ago for hx of diverticulitis.  He did a colonoscopy 2 yrs ago and was normal.  He did not see any reason for surgery.  She had had multiple episodes of diverticulitis. She tells me she has not had any trouble since. Appetite is good. No weight loss.  She usually has a BM about one a day. No melena or rectal bleeding.   EGD/Colonoscopy 12/13/2005  Moderate to large sliding hiatal hernia with some mucosal inflammation of the hernia and part of the stomach but no frank ulceration or erosions. Markedly swollehn friable antral/prepyloric folds, biopsied for histology, this may be the source of her recent melena. Sigmoid diverticulosis. Nonspecific finding of mucosal edema at the sigmoid, biopsy taken from the sigmoid colon as well as the transverse colon. Small external hemorrhoids. Biopsy: Fragments of benign colonic mucosa. 11/21/2010 Glucose 102, BUN 13, Creatinine 0.88, Sodium 142, K 3.8, Chloride 103, Calcium 9.4, Protein 7.0,  Albumin 4.3, total bili 1.1, ALP 57, AST 27, ALT 28. Review of Systems see hpi    Objective:   Physical Exam Filed Vitals:   02/15/11 1431  BP: 130/80  Pulse: 76  Temp: 98.2 F (36.8 C)  Height: 5\' 7"  (1.702 m)  Weight: 214 lb (97.07 kg)   Past Surgical History  Procedure Date  . Appendectomy   . Cholecystectomy   . Carpal tunnel release     Right; Bilateral trigger thumb release surgery  . Knee arthroscopy     Right  . Cardiac stent in 1997     at [facility]   Past Medical History  Diagnosis Date  . CAD (coronary artery disease)   . History of supraventricular tachycardia   . Palpitations   . Mixed dyslipidemia   . Cerebrovascular disease     Nonobstructive  . Hypertension     . GERD (gastroesophageal reflux disease)   . Chronic benign neutropenia   . Chronic anxiety   . Osteoarthritis   . IBS (irritable bowel syndrome)   . Obesity   . Atrial fibrillation    Family Status  Relation Status Death Age  . Mother Deceased     liver cancer  . Father Deceased     MI age 30  . Sister Alive     CAD. One died with complications of DM   . Brother Alive     1 brother killed in WW2. One killed in auto acc. Rest have CAD   History   Social History  . Marital Status: Married    Spouse Name: N/A    Number of Children: N/A  . Years of Education: N/A   Occupational History  . Not on file.   Social History Main Topics  . Smoking status: Never Smoker   . Smokeless tobacco: Never Used  . Alcohol Use: No  . Drug Use: Not on file  . Sexually Active: Not on file   Other Topics Concern  . Not on file   Social History Narrative  . No narrative on file   Allergies  Allergen Reactions  . Codeine     REACTION: sweating  . Morphine     REACTION: sweating  Alert and oriented. Skin warm and dry. Oral mucosa is moist. Natural teeth in good condition. Sclera anicteric, conjunctivae is pink. Thyroid not enlarged. No cervical lymphadenopathy. Lungs clear. Heart regular rate and rhythm.  Abdomen is soft. Bowel sounds are positive. No hepatomegaly. No abdominal masses felt. No tenderness.  No edema to lower extremities. Patient is alert and oriented.     Assessment:    Hx of GI , diverticulitis.  She is not having any problems.    Plan:    She will follow up in one year. If any problems she will call our office.

## 2011-03-25 DIAGNOSIS — H01009 Unspecified blepharitis unspecified eye, unspecified eyelid: Secondary | ICD-10-CM | POA: Diagnosis not present

## 2011-03-25 DIAGNOSIS — H538 Other visual disturbances: Secondary | ICD-10-CM | POA: Diagnosis not present

## 2011-03-25 DIAGNOSIS — H2510 Age-related nuclear cataract, unspecified eye: Secondary | ICD-10-CM | POA: Diagnosis not present

## 2011-04-01 DIAGNOSIS — M1710 Unilateral primary osteoarthritis, unspecified knee: Secondary | ICD-10-CM | POA: Diagnosis not present

## 2011-05-26 DIAGNOSIS — M129 Arthropathy, unspecified: Secondary | ICD-10-CM | POA: Diagnosis not present

## 2011-06-18 DIAGNOSIS — E7800 Pure hypercholesterolemia, unspecified: Secondary | ICD-10-CM | POA: Diagnosis not present

## 2011-06-23 DIAGNOSIS — E7800 Pure hypercholesterolemia, unspecified: Secondary | ICD-10-CM | POA: Diagnosis not present

## 2011-06-23 DIAGNOSIS — I10 Essential (primary) hypertension: Secondary | ICD-10-CM | POA: Diagnosis not present

## 2011-06-23 DIAGNOSIS — G622 Polyneuropathy due to other toxic agents: Secondary | ICD-10-CM | POA: Diagnosis not present

## 2011-06-23 DIAGNOSIS — M129 Arthropathy, unspecified: Secondary | ICD-10-CM | POA: Diagnosis not present

## 2011-06-23 DIAGNOSIS — K219 Gastro-esophageal reflux disease without esophagitis: Secondary | ICD-10-CM | POA: Diagnosis not present

## 2011-07-05 DIAGNOSIS — M1710 Unilateral primary osteoarthritis, unspecified knee: Secondary | ICD-10-CM | POA: Diagnosis not present

## 2011-08-05 DIAGNOSIS — IMO0002 Reserved for concepts with insufficient information to code with codable children: Secondary | ICD-10-CM | POA: Diagnosis not present

## 2011-08-05 DIAGNOSIS — Z79899 Other long term (current) drug therapy: Secondary | ICD-10-CM | POA: Diagnosis not present

## 2011-08-05 DIAGNOSIS — K219 Gastro-esophageal reflux disease without esophagitis: Secondary | ICD-10-CM | POA: Diagnosis not present

## 2011-08-05 DIAGNOSIS — I10 Essential (primary) hypertension: Secondary | ICD-10-CM | POA: Diagnosis not present

## 2011-08-05 DIAGNOSIS — I2510 Atherosclerotic heart disease of native coronary artery without angina pectoris: Secondary | ICD-10-CM | POA: Diagnosis not present

## 2011-08-05 DIAGNOSIS — Z01818 Encounter for other preprocedural examination: Secondary | ICD-10-CM | POA: Diagnosis not present

## 2011-08-10 DIAGNOSIS — K219 Gastro-esophageal reflux disease without esophagitis: Secondary | ICD-10-CM | POA: Diagnosis present

## 2011-08-10 DIAGNOSIS — I2510 Atherosclerotic heart disease of native coronary artery without angina pectoris: Secondary | ICD-10-CM | POA: Diagnosis not present

## 2011-08-10 DIAGNOSIS — I10 Essential (primary) hypertension: Secondary | ICD-10-CM | POA: Diagnosis not present

## 2011-08-10 DIAGNOSIS — Z79899 Other long term (current) drug therapy: Secondary | ICD-10-CM | POA: Diagnosis not present

## 2011-08-10 DIAGNOSIS — K589 Irritable bowel syndrome without diarrhea: Secondary | ICD-10-CM | POA: Diagnosis present

## 2011-08-10 DIAGNOSIS — D62 Acute posthemorrhagic anemia: Secondary | ICD-10-CM | POA: Diagnosis not present

## 2011-08-10 DIAGNOSIS — Z7982 Long term (current) use of aspirin: Secondary | ICD-10-CM | POA: Diagnosis not present

## 2011-08-10 DIAGNOSIS — I129 Hypertensive chronic kidney disease with stage 1 through stage 4 chronic kidney disease, or unspecified chronic kidney disease: Secondary | ICD-10-CM | POA: Diagnosis not present

## 2011-08-10 DIAGNOSIS — Z9889 Other specified postprocedural states: Secondary | ICD-10-CM | POA: Diagnosis not present

## 2011-08-10 DIAGNOSIS — I499 Cardiac arrhythmia, unspecified: Secondary | ICD-10-CM | POA: Diagnosis present

## 2011-08-10 DIAGNOSIS — G589 Mononeuropathy, unspecified: Secondary | ICD-10-CM | POA: Diagnosis present

## 2011-08-10 DIAGNOSIS — M25569 Pain in unspecified knee: Secondary | ICD-10-CM | POA: Diagnosis not present

## 2011-08-10 DIAGNOSIS — Z886 Allergy status to analgesic agent status: Secondary | ICD-10-CM | POA: Diagnosis not present

## 2011-08-10 DIAGNOSIS — Z96659 Presence of unspecified artificial knee joint: Secondary | ICD-10-CM | POA: Diagnosis not present

## 2011-08-10 DIAGNOSIS — Z9861 Coronary angioplasty status: Secondary | ICD-10-CM | POA: Diagnosis not present

## 2011-08-10 DIAGNOSIS — Z471 Aftercare following joint replacement surgery: Secondary | ICD-10-CM | POA: Diagnosis not present

## 2011-08-10 DIAGNOSIS — E785 Hyperlipidemia, unspecified: Secondary | ICD-10-CM | POA: Diagnosis not present

## 2011-08-10 DIAGNOSIS — N189 Chronic kidney disease, unspecified: Secondary | ICD-10-CM | POA: Diagnosis present

## 2011-08-10 DIAGNOSIS — D696 Thrombocytopenia, unspecified: Secondary | ICD-10-CM | POA: Diagnosis not present

## 2011-08-10 DIAGNOSIS — D72819 Decreased white blood cell count, unspecified: Secondary | ICD-10-CM | POA: Diagnosis not present

## 2011-08-10 DIAGNOSIS — M1710 Unilateral primary osteoarthritis, unspecified knee: Secondary | ICD-10-CM | POA: Diagnosis not present

## 2011-08-14 DIAGNOSIS — N189 Chronic kidney disease, unspecified: Secondary | ICD-10-CM | POA: Diagnosis not present

## 2011-08-14 DIAGNOSIS — I2510 Atherosclerotic heart disease of native coronary artery without angina pectoris: Secondary | ICD-10-CM | POA: Diagnosis not present

## 2011-08-14 DIAGNOSIS — R262 Difficulty in walking, not elsewhere classified: Secondary | ICD-10-CM | POA: Diagnosis not present

## 2011-08-14 DIAGNOSIS — Z471 Aftercare following joint replacement surgery: Secondary | ICD-10-CM | POA: Diagnosis not present

## 2011-08-14 DIAGNOSIS — I129 Hypertensive chronic kidney disease with stage 1 through stage 4 chronic kidney disease, or unspecified chronic kidney disease: Secondary | ICD-10-CM | POA: Diagnosis not present

## 2011-08-14 DIAGNOSIS — IMO0001 Reserved for inherently not codable concepts without codable children: Secondary | ICD-10-CM | POA: Diagnosis not present

## 2011-08-16 DIAGNOSIS — R262 Difficulty in walking, not elsewhere classified: Secondary | ICD-10-CM | POA: Diagnosis not present

## 2011-08-16 DIAGNOSIS — I2510 Atherosclerotic heart disease of native coronary artery without angina pectoris: Secondary | ICD-10-CM | POA: Diagnosis not present

## 2011-08-16 DIAGNOSIS — Z471 Aftercare following joint replacement surgery: Secondary | ICD-10-CM | POA: Diagnosis not present

## 2011-08-16 DIAGNOSIS — I129 Hypertensive chronic kidney disease with stage 1 through stage 4 chronic kidney disease, or unspecified chronic kidney disease: Secondary | ICD-10-CM | POA: Diagnosis not present

## 2011-08-16 DIAGNOSIS — IMO0001 Reserved for inherently not codable concepts without codable children: Secondary | ICD-10-CM | POA: Diagnosis not present

## 2011-08-16 DIAGNOSIS — N189 Chronic kidney disease, unspecified: Secondary | ICD-10-CM | POA: Diagnosis not present

## 2011-08-17 DIAGNOSIS — N189 Chronic kidney disease, unspecified: Secondary | ICD-10-CM | POA: Diagnosis not present

## 2011-08-17 DIAGNOSIS — I2510 Atherosclerotic heart disease of native coronary artery without angina pectoris: Secondary | ICD-10-CM | POA: Diagnosis not present

## 2011-08-17 DIAGNOSIS — I129 Hypertensive chronic kidney disease with stage 1 through stage 4 chronic kidney disease, or unspecified chronic kidney disease: Secondary | ICD-10-CM | POA: Diagnosis not present

## 2011-08-17 DIAGNOSIS — R262 Difficulty in walking, not elsewhere classified: Secondary | ICD-10-CM | POA: Diagnosis not present

## 2011-08-17 DIAGNOSIS — IMO0001 Reserved for inherently not codable concepts without codable children: Secondary | ICD-10-CM | POA: Diagnosis not present

## 2011-08-17 DIAGNOSIS — Z471 Aftercare following joint replacement surgery: Secondary | ICD-10-CM | POA: Diagnosis not present

## 2011-08-18 DIAGNOSIS — I129 Hypertensive chronic kidney disease with stage 1 through stage 4 chronic kidney disease, or unspecified chronic kidney disease: Secondary | ICD-10-CM | POA: Diagnosis not present

## 2011-08-18 DIAGNOSIS — I2510 Atherosclerotic heart disease of native coronary artery without angina pectoris: Secondary | ICD-10-CM | POA: Diagnosis not present

## 2011-08-18 DIAGNOSIS — Z471 Aftercare following joint replacement surgery: Secondary | ICD-10-CM | POA: Diagnosis not present

## 2011-08-18 DIAGNOSIS — IMO0001 Reserved for inherently not codable concepts without codable children: Secondary | ICD-10-CM | POA: Diagnosis not present

## 2011-08-18 DIAGNOSIS — R262 Difficulty in walking, not elsewhere classified: Secondary | ICD-10-CM | POA: Diagnosis not present

## 2011-08-18 DIAGNOSIS — N189 Chronic kidney disease, unspecified: Secondary | ICD-10-CM | POA: Diagnosis not present

## 2011-08-19 DIAGNOSIS — I2510 Atherosclerotic heart disease of native coronary artery without angina pectoris: Secondary | ICD-10-CM | POA: Diagnosis not present

## 2011-08-19 DIAGNOSIS — R262 Difficulty in walking, not elsewhere classified: Secondary | ICD-10-CM | POA: Diagnosis not present

## 2011-08-19 DIAGNOSIS — Z471 Aftercare following joint replacement surgery: Secondary | ICD-10-CM | POA: Diagnosis not present

## 2011-08-19 DIAGNOSIS — IMO0001 Reserved for inherently not codable concepts without codable children: Secondary | ICD-10-CM | POA: Diagnosis not present

## 2011-08-19 DIAGNOSIS — I129 Hypertensive chronic kidney disease with stage 1 through stage 4 chronic kidney disease, or unspecified chronic kidney disease: Secondary | ICD-10-CM | POA: Diagnosis not present

## 2011-08-19 DIAGNOSIS — N189 Chronic kidney disease, unspecified: Secondary | ICD-10-CM | POA: Diagnosis not present

## 2011-08-20 DIAGNOSIS — Z96659 Presence of unspecified artificial knee joint: Secondary | ICD-10-CM | POA: Diagnosis not present

## 2011-08-20 DIAGNOSIS — M1710 Unilateral primary osteoarthritis, unspecified knee: Secondary | ICD-10-CM | POA: Diagnosis not present

## 2011-08-20 DIAGNOSIS — N189 Chronic kidney disease, unspecified: Secondary | ICD-10-CM | POA: Diagnosis not present

## 2011-08-20 DIAGNOSIS — R262 Difficulty in walking, not elsewhere classified: Secondary | ICD-10-CM | POA: Diagnosis not present

## 2011-08-20 DIAGNOSIS — I2510 Atherosclerotic heart disease of native coronary artery without angina pectoris: Secondary | ICD-10-CM | POA: Diagnosis not present

## 2011-08-20 DIAGNOSIS — IMO0001 Reserved for inherently not codable concepts without codable children: Secondary | ICD-10-CM | POA: Diagnosis not present

## 2011-08-20 DIAGNOSIS — Z471 Aftercare following joint replacement surgery: Secondary | ICD-10-CM | POA: Diagnosis not present

## 2011-08-20 DIAGNOSIS — I129 Hypertensive chronic kidney disease with stage 1 through stage 4 chronic kidney disease, or unspecified chronic kidney disease: Secondary | ICD-10-CM | POA: Diagnosis not present

## 2011-08-23 DIAGNOSIS — R262 Difficulty in walking, not elsewhere classified: Secondary | ICD-10-CM | POA: Diagnosis not present

## 2011-08-23 DIAGNOSIS — I2510 Atherosclerotic heart disease of native coronary artery without angina pectoris: Secondary | ICD-10-CM | POA: Diagnosis not present

## 2011-08-23 DIAGNOSIS — N189 Chronic kidney disease, unspecified: Secondary | ICD-10-CM | POA: Diagnosis not present

## 2011-08-23 DIAGNOSIS — I129 Hypertensive chronic kidney disease with stage 1 through stage 4 chronic kidney disease, or unspecified chronic kidney disease: Secondary | ICD-10-CM | POA: Diagnosis not present

## 2011-08-23 DIAGNOSIS — Z471 Aftercare following joint replacement surgery: Secondary | ICD-10-CM | POA: Diagnosis not present

## 2011-08-23 DIAGNOSIS — IMO0001 Reserved for inherently not codable concepts without codable children: Secondary | ICD-10-CM | POA: Diagnosis not present

## 2011-08-24 DIAGNOSIS — M1710 Unilateral primary osteoarthritis, unspecified knee: Secondary | ICD-10-CM | POA: Diagnosis not present

## 2011-08-24 DIAGNOSIS — Z96659 Presence of unspecified artificial knee joint: Secondary | ICD-10-CM | POA: Diagnosis not present

## 2011-08-24 DIAGNOSIS — IMO0001 Reserved for inherently not codable concepts without codable children: Secondary | ICD-10-CM | POA: Diagnosis not present

## 2011-08-27 DIAGNOSIS — M1710 Unilateral primary osteoarthritis, unspecified knee: Secondary | ICD-10-CM | POA: Diagnosis not present

## 2011-08-27 DIAGNOSIS — IMO0001 Reserved for inherently not codable concepts without codable children: Secondary | ICD-10-CM | POA: Diagnosis not present

## 2011-08-27 DIAGNOSIS — Z96659 Presence of unspecified artificial knee joint: Secondary | ICD-10-CM | POA: Diagnosis not present

## 2011-08-31 DIAGNOSIS — IMO0001 Reserved for inherently not codable concepts without codable children: Secondary | ICD-10-CM | POA: Diagnosis not present

## 2011-08-31 DIAGNOSIS — M1710 Unilateral primary osteoarthritis, unspecified knee: Secondary | ICD-10-CM | POA: Diagnosis not present

## 2011-08-31 DIAGNOSIS — Z96659 Presence of unspecified artificial knee joint: Secondary | ICD-10-CM | POA: Diagnosis not present

## 2011-09-01 DIAGNOSIS — IMO0001 Reserved for inherently not codable concepts without codable children: Secondary | ICD-10-CM | POA: Diagnosis not present

## 2011-09-01 DIAGNOSIS — M1710 Unilateral primary osteoarthritis, unspecified knee: Secondary | ICD-10-CM | POA: Diagnosis not present

## 2011-09-01 DIAGNOSIS — Z96659 Presence of unspecified artificial knee joint: Secondary | ICD-10-CM | POA: Diagnosis not present

## 2011-09-03 DIAGNOSIS — Z96659 Presence of unspecified artificial knee joint: Secondary | ICD-10-CM | POA: Diagnosis not present

## 2011-09-03 DIAGNOSIS — M1710 Unilateral primary osteoarthritis, unspecified knee: Secondary | ICD-10-CM | POA: Diagnosis not present

## 2011-09-03 DIAGNOSIS — IMO0001 Reserved for inherently not codable concepts without codable children: Secondary | ICD-10-CM | POA: Diagnosis not present

## 2011-09-06 DIAGNOSIS — Z96659 Presence of unspecified artificial knee joint: Secondary | ICD-10-CM | POA: Diagnosis not present

## 2011-09-06 DIAGNOSIS — IMO0001 Reserved for inherently not codable concepts without codable children: Secondary | ICD-10-CM | POA: Diagnosis not present

## 2011-09-06 DIAGNOSIS — M1710 Unilateral primary osteoarthritis, unspecified knee: Secondary | ICD-10-CM | POA: Diagnosis not present

## 2011-09-08 DIAGNOSIS — Z96659 Presence of unspecified artificial knee joint: Secondary | ICD-10-CM | POA: Diagnosis not present

## 2011-09-08 DIAGNOSIS — IMO0001 Reserved for inherently not codable concepts without codable children: Secondary | ICD-10-CM | POA: Diagnosis not present

## 2011-09-08 DIAGNOSIS — M1710 Unilateral primary osteoarthritis, unspecified knee: Secondary | ICD-10-CM | POA: Diagnosis not present

## 2011-09-10 DIAGNOSIS — Z96659 Presence of unspecified artificial knee joint: Secondary | ICD-10-CM | POA: Diagnosis not present

## 2011-09-10 DIAGNOSIS — IMO0001 Reserved for inherently not codable concepts without codable children: Secondary | ICD-10-CM | POA: Diagnosis not present

## 2011-09-10 DIAGNOSIS — M1710 Unilateral primary osteoarthritis, unspecified knee: Secondary | ICD-10-CM | POA: Diagnosis not present

## 2011-09-14 DIAGNOSIS — IMO0001 Reserved for inherently not codable concepts without codable children: Secondary | ICD-10-CM | POA: Diagnosis not present

## 2011-09-14 DIAGNOSIS — Z96659 Presence of unspecified artificial knee joint: Secondary | ICD-10-CM | POA: Diagnosis not present

## 2011-09-14 DIAGNOSIS — M1710 Unilateral primary osteoarthritis, unspecified knee: Secondary | ICD-10-CM | POA: Diagnosis not present

## 2011-09-15 DIAGNOSIS — Z96659 Presence of unspecified artificial knee joint: Secondary | ICD-10-CM | POA: Diagnosis not present

## 2011-09-15 DIAGNOSIS — M1710 Unilateral primary osteoarthritis, unspecified knee: Secondary | ICD-10-CM | POA: Diagnosis not present

## 2011-09-15 DIAGNOSIS — IMO0001 Reserved for inherently not codable concepts without codable children: Secondary | ICD-10-CM | POA: Diagnosis not present

## 2011-09-17 DIAGNOSIS — IMO0001 Reserved for inherently not codable concepts without codable children: Secondary | ICD-10-CM | POA: Diagnosis not present

## 2011-09-17 DIAGNOSIS — Z96659 Presence of unspecified artificial knee joint: Secondary | ICD-10-CM | POA: Diagnosis not present

## 2011-09-17 DIAGNOSIS — M1710 Unilateral primary osteoarthritis, unspecified knee: Secondary | ICD-10-CM | POA: Diagnosis not present

## 2011-09-20 DIAGNOSIS — IMO0001 Reserved for inherently not codable concepts without codable children: Secondary | ICD-10-CM | POA: Diagnosis not present

## 2011-09-20 DIAGNOSIS — M1710 Unilateral primary osteoarthritis, unspecified knee: Secondary | ICD-10-CM | POA: Diagnosis not present

## 2011-09-20 DIAGNOSIS — Z96659 Presence of unspecified artificial knee joint: Secondary | ICD-10-CM | POA: Diagnosis not present

## 2011-09-22 DIAGNOSIS — IMO0001 Reserved for inherently not codable concepts without codable children: Secondary | ICD-10-CM | POA: Diagnosis not present

## 2011-09-22 DIAGNOSIS — Z96659 Presence of unspecified artificial knee joint: Secondary | ICD-10-CM | POA: Diagnosis not present

## 2011-09-22 DIAGNOSIS — M1710 Unilateral primary osteoarthritis, unspecified knee: Secondary | ICD-10-CM | POA: Diagnosis not present

## 2011-12-06 ENCOUNTER — Ambulatory Visit: Payer: Medicare Other | Admitting: Cardiology

## 2011-12-20 DIAGNOSIS — K219 Gastro-esophageal reflux disease without esophagitis: Secondary | ICD-10-CM | POA: Diagnosis not present

## 2011-12-20 DIAGNOSIS — D509 Iron deficiency anemia, unspecified: Secondary | ICD-10-CM | POA: Diagnosis not present

## 2011-12-20 DIAGNOSIS — I10 Essential (primary) hypertension: Secondary | ICD-10-CM | POA: Diagnosis not present

## 2011-12-20 DIAGNOSIS — E7800 Pure hypercholesterolemia, unspecified: Secondary | ICD-10-CM | POA: Diagnosis not present

## 2011-12-20 DIAGNOSIS — Z23 Encounter for immunization: Secondary | ICD-10-CM | POA: Diagnosis not present

## 2011-12-24 DIAGNOSIS — Z96659 Presence of unspecified artificial knee joint: Secondary | ICD-10-CM | POA: Diagnosis not present

## 2011-12-27 DIAGNOSIS — R5381 Other malaise: Secondary | ICD-10-CM | POA: Diagnosis not present

## 2011-12-27 DIAGNOSIS — R5383 Other fatigue: Secondary | ICD-10-CM | POA: Diagnosis not present

## 2011-12-27 DIAGNOSIS — R1084 Generalized abdominal pain: Secondary | ICD-10-CM | POA: Diagnosis not present

## 2011-12-30 ENCOUNTER — Ambulatory Visit: Payer: Medicare Other | Admitting: Cardiovascular Disease

## 2011-12-31 ENCOUNTER — Other Ambulatory Visit: Payer: Self-pay | Admitting: Cardiovascular Disease

## 2011-12-31 DIAGNOSIS — R079 Chest pain, unspecified: Secondary | ICD-10-CM | POA: Diagnosis not present

## 2011-12-31 DIAGNOSIS — K449 Diaphragmatic hernia without obstruction or gangrene: Secondary | ICD-10-CM | POA: Diagnosis not present

## 2011-12-31 DIAGNOSIS — Z9861 Coronary angioplasty status: Secondary | ICD-10-CM | POA: Diagnosis not present

## 2011-12-31 DIAGNOSIS — F411 Generalized anxiety disorder: Secondary | ICD-10-CM | POA: Diagnosis not present

## 2011-12-31 DIAGNOSIS — I2510 Atherosclerotic heart disease of native coronary artery without angina pectoris: Secondary | ICD-10-CM | POA: Diagnosis not present

## 2011-12-31 DIAGNOSIS — R0789 Other chest pain: Secondary | ICD-10-CM | POA: Diagnosis not present

## 2011-12-31 DIAGNOSIS — I10 Essential (primary) hypertension: Secondary | ICD-10-CM | POA: Diagnosis not present

## 2011-12-31 DIAGNOSIS — I498 Other specified cardiac arrhythmias: Secondary | ICD-10-CM | POA: Diagnosis not present

## 2011-12-31 DIAGNOSIS — Z79899 Other long term (current) drug therapy: Secondary | ICD-10-CM | POA: Diagnosis not present

## 2011-12-31 DIAGNOSIS — Z7982 Long term (current) use of aspirin: Secondary | ICD-10-CM | POA: Diagnosis not present

## 2011-12-31 DIAGNOSIS — Z886 Allergy status to analgesic agent status: Secondary | ICD-10-CM | POA: Diagnosis not present

## 2011-12-31 DIAGNOSIS — Z885 Allergy status to narcotic agent status: Secondary | ICD-10-CM | POA: Diagnosis not present

## 2011-12-31 DIAGNOSIS — R1013 Epigastric pain: Secondary | ICD-10-CM | POA: Diagnosis not present

## 2011-12-31 DIAGNOSIS — E785 Hyperlipidemia, unspecified: Secondary | ICD-10-CM | POA: Diagnosis not present

## 2011-12-31 DIAGNOSIS — R198 Other specified symptoms and signs involving the digestive system and abdomen: Secondary | ICD-10-CM | POA: Diagnosis not present

## 2011-12-31 DIAGNOSIS — J9819 Other pulmonary collapse: Secondary | ICD-10-CM | POA: Diagnosis not present

## 2011-12-31 DIAGNOSIS — K219 Gastro-esophageal reflux disease without esophagitis: Secondary | ICD-10-CM | POA: Diagnosis not present

## 2011-12-31 DIAGNOSIS — Z8249 Family history of ischemic heart disease and other diseases of the circulatory system: Secondary | ICD-10-CM | POA: Diagnosis not present

## 2012-01-01 DIAGNOSIS — R079 Chest pain, unspecified: Secondary | ICD-10-CM | POA: Diagnosis not present

## 2012-02-09 ENCOUNTER — Encounter (INDEPENDENT_AMBULATORY_CARE_PROVIDER_SITE_OTHER): Payer: Self-pay | Admitting: *Deleted

## 2012-02-17 ENCOUNTER — Ambulatory Visit (INDEPENDENT_AMBULATORY_CARE_PROVIDER_SITE_OTHER): Payer: Medicare Other | Admitting: Internal Medicine

## 2012-02-24 ENCOUNTER — Ambulatory Visit (INDEPENDENT_AMBULATORY_CARE_PROVIDER_SITE_OTHER): Payer: Medicare Other | Admitting: Cardiovascular Disease

## 2012-02-24 ENCOUNTER — Encounter: Payer: Self-pay | Admitting: Cardiovascular Disease

## 2012-02-24 VITALS — BP 117/69 | HR 70 | Ht 66.3 in | Wt 201.0 lb

## 2012-02-24 DIAGNOSIS — E785 Hyperlipidemia, unspecified: Secondary | ICD-10-CM | POA: Diagnosis not present

## 2012-02-24 DIAGNOSIS — I2510 Atherosclerotic heart disease of native coronary artery without angina pectoris: Secondary | ICD-10-CM

## 2012-02-24 DIAGNOSIS — I10 Essential (primary) hypertension: Secondary | ICD-10-CM

## 2012-02-24 DIAGNOSIS — Z8679 Personal history of other diseases of the circulatory system: Secondary | ICD-10-CM

## 2012-02-24 NOTE — Assessment & Plan Note (Signed)
Symptoms are well-controlled with metoprolol.

## 2012-02-24 NOTE — Progress Notes (Signed)
HPI This is a pleasant 78 year old female who is here today for a yearly followup visit. She has known history of coronary artery disease status post stent placement in 1997. She also has intermittent palpitations controlled with metoprolol. She underwent right knee replacement in July of this year. She was hospitalized in October at [facility] for atypical chest pain. She ruled out for myocardial infarction. She was found to have a hiatal Hernia and was treated with the PPI. She hasn't had much chest pain since then. She denies significant dyspnea.  Allergies  Allergen Reactions  . Codeine     REACTION: sweating  . Morphine     REACTION: sweating    Current Outpatient Prescriptions  Medication Sig Dispense Refill  . aspirin 81 MG tablet Take 81 mg by mouth daily.        . Cholecalciferol (VITAMIN D-3) 1000 UNITS CAPS Take 1,000 Units by mouth daily.      . diazepam (VALIUM) 5 MG tablet Take 5 mg by mouth at bedtime. 1/2 tablet by mouth daily      . dicyclomine (BENTYL) 10 MG capsule Take 20 mg by mouth 2 (two) times daily.        . fish oil-omega-3 fatty acids 1000 MG capsule Take 1 g by mouth daily.      Marland Kitchen gabapentin (NEURONTIN) 300 MG capsule Take 300 mg by mouth 3 (three) times daily.        Marland Kitchen lisinopril (PRINIVIL,ZESTRIL) 40 MG tablet Take 40 mg by mouth daily.        . meclizine (ANTIVERT) 25 MG tablet Take 25 mg by mouth 3 (three) times daily as needed.        . metoprolol (TOPROL-XL) 50 MG 24 hr tablet Take 50 mg by mouth daily.        . Multiple Vitamin (MULTIVITAMIN) tablet Take 1 tablet by mouth daily.        . simvastatin (ZOCOR) 20 MG tablet Take 20 mg by mouth at bedtime.        . traMADol (ULTRAM) 50 MG tablet Take 50 mg by mouth 3 (three) times daily.        Past Medical History  Diagnosis Date  . CAD (coronary artery disease)   . History of supraventricular tachycardia   . Palpitations   . Mixed dyslipidemia   . Cerebrovascular disease    Nonobstructive  . Hypertension   . GERD (gastroesophageal reflux disease)   . Chronic benign neutropenia   . Chronic anxiety   . Osteoarthritis   . IBS (irritable bowel syndrome)   . Obesity   . Atrial fibrillation     Past Surgical History  Procedure Date  . Appendectomy   . Cholecystectomy   . Carpal tunnel release     Right; Bilateral trigger thumb release surgery  . Knee arthroscopy     Right  . Cardiac stent in 1997     at [facility]    ROS:  Hiatal hernia.  Otherwise as stated in the HPI and negative for all other systems.  PHYSICAL EXAM BP 117/69  Pulse 70  Ht 5' 6.3" (1.684 m)  Wt 201 lb (91.173 kg)  BMI 32.15 kg/m2 GENERAL:  Well appearing HEENT:  Pupils equal round and reactive, fundi not visualized, oral mucosa unremarkable, dentures NECK:  No jugular venous distention, waveform within normal limits, carotid upstroke brisk and symmetric, no bruits, no thyromegaly LYMPHATICS:  No cervical, inguinal adenopathy LUNGS:  Clear to auscultation  bilaterally BACK:  No CVA tenderness CHEST:  Unremarkable HEART:  PMI not displaced or sustained,S1 and S2 within normal limits, no S3, no S4, no clicks, no rubs, no murmurs ABD:  Flat, positive bowel sounds normal in frequency in pitch, no bruits, no rebound, no guarding, no midline pulsatile mass, no hepatomegaly, no splenomegaly EXT:  2 plus pulses throughout, no edema, no cyanosis no clubbing SKIN:  No rashes no nodules NEURO:  Cranial nerves II through XII grossly intact, motor grossly intact throughout PSYCH:  Cognitively intact, oriented to person place and time    ASSESSMENT AND PLAN

## 2012-02-24 NOTE — Assessment & Plan Note (Signed)
Blood pressure is well controlled on current medications. 

## 2012-02-24 NOTE — Patient Instructions (Addendum)
Continue same medications  Follow up in 1 year

## 2012-02-24 NOTE — Assessment & Plan Note (Signed)
Continue simvastatin 20 mg once daily. This is being managed by Dr. Dimas Aguas.

## 2012-02-24 NOTE — Assessment & Plan Note (Signed)
Chest pain in October was atypical and likely due to gastroesophageal reflux disease. She hasn't had much recurrent symptoms. Continue medical therapy for now. If the patient develops recurrent chest pain, a stress test can be considered.

## 2012-05-16 DIAGNOSIS — H2510 Age-related nuclear cataract, unspecified eye: Secondary | ICD-10-CM | POA: Diagnosis not present

## 2012-05-16 DIAGNOSIS — G43009 Migraine without aura, not intractable, without status migrainosus: Secondary | ICD-10-CM | POA: Diagnosis not present

## 2012-05-31 DIAGNOSIS — E7800 Pure hypercholesterolemia, unspecified: Secondary | ICD-10-CM | POA: Diagnosis not present

## 2012-05-31 DIAGNOSIS — H2510 Age-related nuclear cataract, unspecified eye: Secondary | ICD-10-CM | POA: Diagnosis not present

## 2012-05-31 DIAGNOSIS — H538 Other visual disturbances: Secondary | ICD-10-CM | POA: Diagnosis not present

## 2012-05-31 DIAGNOSIS — I10 Essential (primary) hypertension: Secondary | ICD-10-CM | POA: Diagnosis not present

## 2012-05-31 DIAGNOSIS — J0100 Acute maxillary sinusitis, unspecified: Secondary | ICD-10-CM | POA: Diagnosis not present

## 2012-07-13 DIAGNOSIS — M129 Arthropathy, unspecified: Secondary | ICD-10-CM | POA: Diagnosis not present

## 2012-07-24 DIAGNOSIS — Z96659 Presence of unspecified artificial knee joint: Secondary | ICD-10-CM | POA: Diagnosis not present

## 2012-07-24 DIAGNOSIS — M1710 Unilateral primary osteoarthritis, unspecified knee: Secondary | ICD-10-CM | POA: Diagnosis not present

## 2012-08-28 DIAGNOSIS — M1710 Unilateral primary osteoarthritis, unspecified knee: Secondary | ICD-10-CM | POA: Diagnosis not present

## 2012-08-28 DIAGNOSIS — IMO0002 Reserved for concepts with insufficient information to code with codable children: Secondary | ICD-10-CM | POA: Diagnosis not present

## 2012-08-31 DIAGNOSIS — M2340 Loose body in knee, unspecified knee: Secondary | ICD-10-CM | POA: Diagnosis not present

## 2012-08-31 DIAGNOSIS — M23305 Other meniscus derangements, unspecified medial meniscus, unspecified knee: Secondary | ICD-10-CM | POA: Diagnosis not present

## 2012-08-31 DIAGNOSIS — M23349 Other meniscus derangements, anterior horn of lateral meniscus, unspecified knee: Secondary | ICD-10-CM | POA: Diagnosis not present

## 2012-08-31 DIAGNOSIS — M1710 Unilateral primary osteoarthritis, unspecified knee: Secondary | ICD-10-CM | POA: Diagnosis not present

## 2012-08-31 DIAGNOSIS — M2240 Chondromalacia patellae, unspecified knee: Secondary | ICD-10-CM | POA: Diagnosis not present

## 2012-08-31 DIAGNOSIS — S83289A Other tear of lateral meniscus, current injury, unspecified knee, initial encounter: Secondary | ICD-10-CM | POA: Diagnosis not present

## 2012-09-04 DIAGNOSIS — IMO0002 Reserved for concepts with insufficient information to code with codable children: Secondary | ICD-10-CM | POA: Diagnosis not present

## 2012-09-04 DIAGNOSIS — M1710 Unilateral primary osteoarthritis, unspecified knee: Secondary | ICD-10-CM | POA: Diagnosis not present

## 2012-09-05 DIAGNOSIS — IMO0002 Reserved for concepts with insufficient information to code with codable children: Secondary | ICD-10-CM | POA: Diagnosis not present

## 2012-09-05 DIAGNOSIS — E669 Obesity, unspecified: Secondary | ICD-10-CM | POA: Diagnosis not present

## 2012-09-05 DIAGNOSIS — S83289A Other tear of lateral meniscus, current injury, unspecified knee, initial encounter: Secondary | ICD-10-CM | POA: Diagnosis not present

## 2012-09-05 DIAGNOSIS — K219 Gastro-esophageal reflux disease without esophagitis: Secondary | ICD-10-CM | POA: Diagnosis not present

## 2012-09-05 DIAGNOSIS — Z800 Family history of malignant neoplasm of digestive organs: Secondary | ICD-10-CM | POA: Diagnosis not present

## 2012-09-05 DIAGNOSIS — Z8489 Family history of other specified conditions: Secondary | ICD-10-CM | POA: Diagnosis not present

## 2012-09-05 DIAGNOSIS — Z79899 Other long term (current) drug therapy: Secondary | ICD-10-CM | POA: Diagnosis not present

## 2012-09-05 DIAGNOSIS — M1710 Unilateral primary osteoarthritis, unspecified knee: Secondary | ICD-10-CM | POA: Diagnosis not present

## 2012-09-05 DIAGNOSIS — I10 Essential (primary) hypertension: Secondary | ICD-10-CM | POA: Diagnosis not present

## 2012-09-05 DIAGNOSIS — Z885 Allergy status to narcotic agent status: Secondary | ICD-10-CM | POA: Diagnosis not present

## 2012-09-05 DIAGNOSIS — Z6833 Body mass index (BMI) 33.0-33.9, adult: Secondary | ICD-10-CM | POA: Diagnosis not present

## 2012-09-05 DIAGNOSIS — E7800 Pure hypercholesterolemia, unspecified: Secondary | ICD-10-CM | POA: Diagnosis not present

## 2012-09-05 DIAGNOSIS — Z7982 Long term (current) use of aspirin: Secondary | ICD-10-CM | POA: Diagnosis not present

## 2012-11-21 DIAGNOSIS — K5289 Other specified noninfective gastroenteritis and colitis: Secondary | ICD-10-CM | POA: Diagnosis not present

## 2012-11-21 DIAGNOSIS — J069 Acute upper respiratory infection, unspecified: Secondary | ICD-10-CM | POA: Diagnosis not present

## 2012-11-22 DIAGNOSIS — R197 Diarrhea, unspecified: Secondary | ICD-10-CM | POA: Diagnosis not present

## 2012-11-24 DIAGNOSIS — K219 Gastro-esophageal reflux disease without esophagitis: Secondary | ICD-10-CM | POA: Diagnosis not present

## 2012-11-24 DIAGNOSIS — I10 Essential (primary) hypertension: Secondary | ICD-10-CM | POA: Diagnosis not present

## 2012-11-24 DIAGNOSIS — E538 Deficiency of other specified B group vitamins: Secondary | ICD-10-CM | POA: Diagnosis not present

## 2012-11-24 DIAGNOSIS — E7800 Pure hypercholesterolemia, unspecified: Secondary | ICD-10-CM | POA: Diagnosis not present

## 2012-12-01 DIAGNOSIS — K219 Gastro-esophageal reflux disease without esophagitis: Secondary | ICD-10-CM | POA: Diagnosis not present

## 2012-12-01 DIAGNOSIS — I10 Essential (primary) hypertension: Secondary | ICD-10-CM | POA: Diagnosis not present

## 2012-12-01 DIAGNOSIS — Z23 Encounter for immunization: Secondary | ICD-10-CM | POA: Diagnosis not present

## 2012-12-01 DIAGNOSIS — E7800 Pure hypercholesterolemia, unspecified: Secondary | ICD-10-CM | POA: Diagnosis not present

## 2012-12-27 DIAGNOSIS — Z1231 Encounter for screening mammogram for malignant neoplasm of breast: Secondary | ICD-10-CM | POA: Diagnosis not present

## 2013-02-06 DIAGNOSIS — M1710 Unilateral primary osteoarthritis, unspecified knee: Secondary | ICD-10-CM | POA: Diagnosis not present

## 2013-03-02 ENCOUNTER — Ambulatory Visit (INDEPENDENT_AMBULATORY_CARE_PROVIDER_SITE_OTHER): Payer: Medicare Other | Admitting: Cardiovascular Disease

## 2013-03-02 ENCOUNTER — Encounter: Payer: Self-pay | Admitting: Cardiovascular Disease

## 2013-03-02 VITALS — BP 120/75 | HR 62 | Ht 65.0 in | Wt 204.0 lb

## 2013-03-02 DIAGNOSIS — I2510 Atherosclerotic heart disease of native coronary artery without angina pectoris: Secondary | ICD-10-CM

## 2013-03-02 DIAGNOSIS — I10 Essential (primary) hypertension: Secondary | ICD-10-CM

## 2013-03-02 DIAGNOSIS — E785 Hyperlipidemia, unspecified: Secondary | ICD-10-CM | POA: Diagnosis not present

## 2013-03-02 DIAGNOSIS — Z8679 Personal history of other diseases of the circulatory system: Secondary | ICD-10-CM

## 2013-03-02 NOTE — Progress Notes (Signed)
Patient ID: Dominique Matthews, female   DOB: Mar 14, 1929, 79 y.o.   MRN: [id number]      SUBJECTIVE:This is a pleasant 79 year old female who is here today for a yearly followup visit. She has a known history of coronary artery disease status post stent placement in 1997. She also has intermittent palpitations controlled with metoprolol. She has been hospitalized with atypical chest pain in the past due to a hiatal hernia treated with PPI's. She had right knee replacement surgery in 2013, and left knee arthroscopic surgery this past summer, and walks with a cane. She denies chest pain and shortness of breath. She seldom experiences palpitations.  She has been married for 62 years. She is looking forward to having her first great grandchild in May of next year.    Allergies  Allergen Reactions  . Codeine     REACTION: sweating  . Morphine     REACTION: sweating    Current Outpatient Prescriptions  Medication Sig Dispense Refill  . acetaminophen (TYLENOL) 325 MG tablet Take 650 mg by mouth every 6 (six) hours as needed.      Marland Kitchen aspirin 81 MG tablet Take 81 mg by mouth daily.        . Cholecalciferol (VITAMIN D-3) 1000 UNITS CAPS Take 1,000 Units by mouth daily.      . diazepam (VALIUM) 5 MG tablet Take 5 mg by mouth at bedtime. 1/2 tablet by mouth daily      . dicyclomine (BENTYL) 10 MG capsule Take 20 mg by mouth 2 (two) times daily.        Marland Kitchen esomeprazole (NEXIUM) 40 MG capsule Take 40 mg by mouth daily at 12 noon.      . fish oil-omega-3 fatty acids 1000 MG capsule Take 1 g by mouth daily.      Marland Kitchen gabapentin (NEURONTIN) 300 MG capsule Take 300 mg by mouth 3 (three) times daily.        Marland Kitchen lisinopril (PRINIVIL,ZESTRIL) 40 MG tablet Take 40 mg by mouth daily.        . meclizine (ANTIVERT) 25 MG tablet Take 25 mg by mouth 3 (three) times daily as needed.        . metoprolol (TOPROL-XL) 50 MG 24 hr tablet Take 50 mg by mouth daily.       . Multiple Vitamin (MULTIVITAMIN) tablet Take 1 tablet by  mouth daily.        . simvastatin (ZOCOR) 20 MG tablet Take 20 mg by mouth at bedtime.        . traMADol (ULTRAM) 50 MG tablet Take 50 mg by mouth 3 (three) times daily.       No current facility-administered medications for this visit.    Past Medical History  Diagnosis Date  . CAD (coronary artery disease)   . History of supraventricular tachycardia   . Palpitations   . Mixed dyslipidemia   . Cerebrovascular disease     Nonobstructive  . Hypertension   . GERD (gastroesophageal reflux disease)   . Chronic benign neutropenia   . Chronic anxiety   . Osteoarthritis   . IBS (irritable bowel syndrome)   . Obesity   . Atrial fibrillation     Past Surgical History  Procedure Laterality Date  . Appendectomy    . Cholecystectomy    . Carpal tunnel release      Right; Bilateral trigger thumb release surgery  . Knee arthroscopy      Right  . Cardiac stent in 1997  at [facility]    History   Social History  . Marital Status: Married    Spouse Name: N/A    Number of Children: N/A  . Years of Education: N/A   Occupational History  . Not on file.   Social History Main Topics  . Smoking status: Never Smoker   . Smokeless tobacco: Never Used  . Alcohol Use: No  . Drug Use: Not on file  . Sexual Activity: Not on file   Other Topics Concern  . Not on file   Social History Narrative  . No narrative on file     Filed Vitals:   03/02/13 0900  BP: 120/75  Pulse: 62  Height: 5\' 5"  (1.651 m)  Weight: 204 lb (92.534 kg)    PHYSICAL EXAM General: NAD Neck: No JVD, no thyromegaly or thyroid nodule.  Lungs: Clear to auscultation bilaterally with normal respiratory effort. CV: Nondisplaced PMI.  Heart regular S1/S2, no S3/S4, no murmur.  No peripheral edema.  No carotid bruit.  Normal pedal pulses.  Abdomen: Soft, nontender, no hepatosplenomegaly, no distention.  Neurologic: Alert and oriented x 3.  Psych: Normal affect. Extremities: No clubbing or  cyanosis.   ECG: reviewed and available in electronic records. (NSR, 62 bpm)      ASSESSMENT AND PLAN: 1. CAD: symptomatically stable. Continue ASA, metoprolol, and statin. 2. Hyperlipidemia: monitored by Dr. Dimas Aguas. She tells me her TC was 160, on simvastatin 20 mg daily. 3. Palpitations: controlled with Toprol XL 50 mg daily. 4. HTN: controled on lisinopril 40 mg daily.  Dispo: f/u 1 year.  Prentice Docker, M.D., F.A.C.C.

## 2013-03-02 NOTE — Patient Instructions (Signed)
Continue all current medications. Your physician wants you to follow up in:  1 year.  You will receive a reminder letter in the mail one-two months in advance.  If you don't receive a letter, please call our office to schedule the follow up appointment   

## 2013-05-31 DIAGNOSIS — G619 Inflammatory polyneuropathy, unspecified: Secondary | ICD-10-CM | POA: Diagnosis not present

## 2013-05-31 DIAGNOSIS — G622 Polyneuropathy due to other toxic agents: Secondary | ICD-10-CM | POA: Diagnosis not present

## 2013-05-31 DIAGNOSIS — Z23 Encounter for immunization: Secondary | ICD-10-CM | POA: Diagnosis not present

## 2013-05-31 DIAGNOSIS — I10 Essential (primary) hypertension: Secondary | ICD-10-CM | POA: Diagnosis not present

## 2013-05-31 DIAGNOSIS — M129 Arthropathy, unspecified: Secondary | ICD-10-CM | POA: Diagnosis not present

## 2013-05-31 DIAGNOSIS — E7800 Pure hypercholesterolemia, unspecified: Secondary | ICD-10-CM | POA: Diagnosis not present

## 2013-11-01 DIAGNOSIS — H2510 Age-related nuclear cataract, unspecified eye: Secondary | ICD-10-CM | POA: Diagnosis not present

## 2013-11-01 DIAGNOSIS — H538 Other visual disturbances: Secondary | ICD-10-CM | POA: Diagnosis not present

## 2013-11-01 DIAGNOSIS — H04129 Dry eye syndrome of unspecified lacrimal gland: Secondary | ICD-10-CM | POA: Diagnosis not present

## 2013-11-21 DIAGNOSIS — K219 Gastro-esophageal reflux disease without esophagitis: Secondary | ICD-10-CM | POA: Diagnosis not present

## 2013-11-21 DIAGNOSIS — D509 Iron deficiency anemia, unspecified: Secondary | ICD-10-CM | POA: Diagnosis not present

## 2013-11-21 DIAGNOSIS — I10 Essential (primary) hypertension: Secondary | ICD-10-CM | POA: Diagnosis not present

## 2013-11-21 DIAGNOSIS — E7800 Pure hypercholesterolemia, unspecified: Secondary | ICD-10-CM | POA: Diagnosis not present

## 2013-11-30 DIAGNOSIS — E7800 Pure hypercholesterolemia, unspecified: Secondary | ICD-10-CM | POA: Diagnosis not present

## 2013-11-30 DIAGNOSIS — G622 Polyneuropathy due to other toxic agents: Secondary | ICD-10-CM | POA: Diagnosis not present

## 2013-11-30 DIAGNOSIS — M129 Arthropathy, unspecified: Secondary | ICD-10-CM | POA: Diagnosis not present

## 2013-11-30 DIAGNOSIS — G619 Inflammatory polyneuropathy, unspecified: Secondary | ICD-10-CM | POA: Diagnosis not present

## 2013-11-30 DIAGNOSIS — I10 Essential (primary) hypertension: Secondary | ICD-10-CM | POA: Diagnosis not present

## 2013-11-30 DIAGNOSIS — Z23 Encounter for immunization: Secondary | ICD-10-CM | POA: Diagnosis not present

## 2013-12-24 DIAGNOSIS — K5732 Diverticulitis of large intestine without perforation or abscess without bleeding: Secondary | ICD-10-CM | POA: Diagnosis not present

## 2013-12-24 DIAGNOSIS — R1032 Left lower quadrant pain: Secondary | ICD-10-CM | POA: Diagnosis not present

## 2013-12-24 DIAGNOSIS — Z79899 Other long term (current) drug therapy: Secondary | ICD-10-CM | POA: Diagnosis not present

## 2013-12-24 DIAGNOSIS — Z7982 Long term (current) use of aspirin: Secondary | ICD-10-CM | POA: Diagnosis not present

## 2013-12-24 DIAGNOSIS — K5730 Diverticulosis of large intestine without perforation or abscess without bleeding: Secondary | ICD-10-CM | POA: Diagnosis not present

## 2013-12-24 DIAGNOSIS — K5792 Diverticulitis of intestine, part unspecified, without perforation or abscess without bleeding: Secondary | ICD-10-CM | POA: Diagnosis not present

## 2013-12-25 DIAGNOSIS — K5730 Diverticulosis of large intestine without perforation or abscess without bleeding: Secondary | ICD-10-CM | POA: Diagnosis not present

## 2013-12-28 DIAGNOSIS — R5383 Other fatigue: Secondary | ICD-10-CM | POA: Diagnosis not present

## 2013-12-28 DIAGNOSIS — B370 Candidal stomatitis: Secondary | ICD-10-CM | POA: Diagnosis not present

## 2013-12-28 DIAGNOSIS — K5712 Diverticulitis of small intestine without perforation or abscess without bleeding: Secondary | ICD-10-CM | POA: Diagnosis not present

## 2013-12-31 ENCOUNTER — Encounter (HOSPITAL_COMMUNITY): Payer: Self-pay | Admitting: Emergency Medicine

## 2013-12-31 ENCOUNTER — Emergency Department (HOSPITAL_COMMUNITY)
Admission: EM | Admit: 2013-12-31 | Discharge: 2013-12-31 | Disposition: A | Discharge status: Home / Self Care | Payer: Medicare Other | Attending: Emergency Medicine | Admitting: Emergency Medicine

## 2013-12-31 DIAGNOSIS — K219 Gastro-esophageal reflux disease without esophagitis: Secondary | ICD-10-CM | POA: Insufficient documentation

## 2013-12-31 DIAGNOSIS — I10 Essential (primary) hypertension: Secondary | ICD-10-CM | POA: Insufficient documentation

## 2013-12-31 DIAGNOSIS — Z9089 Acquired absence of other organs: Secondary | ICD-10-CM | POA: Insufficient documentation

## 2013-12-31 DIAGNOSIS — R109 Unspecified abdominal pain: Secondary | ICD-10-CM | POA: Diagnosis present

## 2013-12-31 DIAGNOSIS — Z79899 Other long term (current) drug therapy: Secondary | ICD-10-CM | POA: Insufficient documentation

## 2013-12-31 DIAGNOSIS — Z862 Personal history of diseases of the blood and blood-forming organs and certain disorders involving the immune mechanism: Secondary | ICD-10-CM | POA: Insufficient documentation

## 2013-12-31 DIAGNOSIS — K5792 Diverticulitis of intestine, part unspecified, without perforation or abscess without bleeding: Secondary | ICD-10-CM | POA: Insufficient documentation

## 2013-12-31 DIAGNOSIS — I2510 Atherosclerotic heart disease of native coronary artery without angina pectoris: Secondary | ICD-10-CM | POA: Insufficient documentation

## 2013-12-31 DIAGNOSIS — E782 Mixed hyperlipidemia: Secondary | ICD-10-CM | POA: Insufficient documentation

## 2013-12-31 DIAGNOSIS — F419 Anxiety disorder, unspecified: Secondary | ICD-10-CM | POA: Insufficient documentation

## 2013-12-31 DIAGNOSIS — E669 Obesity, unspecified: Secondary | ICD-10-CM | POA: Insufficient documentation

## 2013-12-31 DIAGNOSIS — Z9889 Other specified postprocedural states: Secondary | ICD-10-CM | POA: Diagnosis not present

## 2013-12-31 HISTORY — DX: Diverticulitis of intestine, part unspecified, without perforation or abscess without bleeding: K57.92

## 2013-12-31 LAB — COMPREHENSIVE METABOLIC PANEL
ALT: 25 U/L (ref 0–35)
AST: 29 U/L (ref 0–37)
Albumin: 4 g/dL (ref 3.5–5.2)
Alkaline Phosphatase: 53 U/L (ref 39–117)
Anion gap: 12 (ref 5–15)
BUN: 8 mg/dL (ref 6–23)
CALCIUM: 9.8 mg/dL (ref 8.4–10.5)
CO2: 27 meq/L (ref 19–32)
Chloride: 100 mEq/L (ref 96–112)
Creatinine, Ser: 0.84 mg/dL (ref 0.50–1.10)
GFR calc Af Amer: 73 mL/min — ABNORMAL LOW (ref >90)
GFR calc non Af Amer: 63 mL/min — ABNORMAL LOW (ref >90)
Glucose, Bld: 111 mg/dL — ABNORMAL HIGH (ref 70–99)
POTASSIUM: 4 meq/L (ref 3.7–5.3)
SODIUM: 139 meq/L (ref 137–147)
Total Bilirubin: 0.9 mg/dL (ref 0.3–1.2)
Total Protein: 7.5 g/dL (ref 6.0–8.3)

## 2013-12-31 LAB — CBC WITH DIFFERENTIAL/PLATELET
BASOS ABS: 0 10*3/uL (ref 0.0–0.1)
Basophils Relative: 0 % (ref 0–1)
EOS PCT: 1 % (ref 0–5)
Eosinophils Absolute: 0 10*3/uL (ref 0.0–0.7)
HCT: 37.2 % (ref 36.0–46.0)
Hemoglobin: 13 g/dL (ref 12.0–15.0)
LYMPHS PCT: 19 % (ref 12–46)
Lymphs Abs: 0.8 10*3/uL (ref 0.7–4.0)
MCH: 33.7 pg (ref 26.0–34.0)
MCHC: 34.9 g/dL (ref 30.0–36.0)
MCV: 96.4 fL (ref 78.0–100.0)
Monocytes Absolute: 0.5 10*3/uL (ref 0.1–1.0)
Monocytes Relative: 12 % (ref 3–12)
NEUTROS PCT: 68 % (ref 43–77)
Neutro Abs: 2.8 10*3/uL (ref 1.7–7.7)
PLATELETS: 153 10*3/uL (ref 150–400)
RBC: 3.86 MIL/uL — ABNORMAL LOW (ref 3.87–5.11)
RDW: 12.2 % (ref 11.5–15.5)
WBC: 4.1 10*3/uL (ref 4.0–10.5)

## 2013-12-31 LAB — URINALYSIS, ROUTINE W REFLEX MICROSCOPIC
Bilirubin Urine: NEGATIVE
GLUCOSE, UA: NEGATIVE mg/dL
Hgb urine dipstick: NEGATIVE
KETONES UR: NEGATIVE mg/dL
Leukocytes, UA: NEGATIVE
Nitrite: NEGATIVE
PROTEIN: NEGATIVE mg/dL
Specific Gravity, Urine: 1.01 (ref 1.005–1.030)
Urobilinogen, UA: 0.2 mg/dL (ref 0.0–1.0)
pH: 7 (ref 5.0–8.0)

## 2013-12-31 LAB — I-STAT CG4 LACTIC ACID, ED: Lactic Acid, Venous: 1.22 mmol/L (ref 0.5–2.2)

## 2013-12-31 LAB — LIPASE, BLOOD: Lipase: 48 U/L (ref 11–59)

## 2013-12-31 MED ORDER — CIPROFLOXACIN HCL 500 MG PO TABS: 500.0000 mg | ORAL_TABLET | Freq: Two times a day (BID) | ORAL | Status: DC

## 2013-12-31 MED ORDER — METRONIDAZOLE 500 MG PO TABS: 500.0000 mg | ORAL_TABLET | Freq: Two times a day (BID) | ORAL | Status: DC

## 2013-12-31 MED ORDER — HYDROCODONE-ACETAMINOPHEN 5-325 MG PO TABS: 1.0000 | ORAL_TABLET | ORAL | Status: DC | PRN

## 2013-12-31 MED ORDER — SODIUM CHLORIDE 0.9 % IV BOLUS (SEPSIS)
1000.0000 mL | Freq: Once | INTRAVENOUS | Status: AC
  Administered 2013-12-31: 1000 mL via INTRAVENOUS

## 2013-12-31 MED ORDER — ONDANSETRON 4 MG PO TBDP: 4.0000 mg | ORAL_TABLET | Freq: Three times a day (TID) | ORAL | Status: DC | PRN

## 2013-12-31 MED ORDER — FENTANYL CITRATE 0.05 MG/ML IJ SOLN
50.0000 ug | Freq: Once | INTRAMUSCULAR | Status: AC
  Administered 2013-12-31: 50 ug via INTRAVENOUS
  Filled 2013-12-31: qty 2

## 2013-12-31 NOTE — ED Notes (Signed)
Patient complaining of abdominal pain x 2 weeks. States "I was seen at [facility] last week and they told me I had diverticulitis. I am taking the antibiotic but it isn't any better."

## 2013-12-31 NOTE — ED Notes (Signed)
EDP back in to re eval 

## 2013-12-31 NOTE — Discharge Instructions (Signed)

## 2013-12-31 NOTE — ED Provider Notes (Signed)
This chart was scribed for Dominique Maw Ward, DO by Annye Asa, ED Scribe. This patient was seen in room APA18/APA18 and the patient's care was started at 10:18 AM.   TIME SEEN: 10:18 AM  CHIEF COMPLAINT: abdominal pain   HPI:   HPI Comments: Dominique Matthews is a 80 y.o. female who presents to the Emergency Department complaining of 2 weeks of constant, gradual onset abdominal pain. Patient explains that she want to [facility] 1 week PTA (12/24/13), at which time she received a CT scan and was diagnosed with diverticulitis. She was put on Flagyl and Cipro. She went to her PCP 4 days ago and had a low-grade fever (TEMP 99); she reports are in about a course was continued for several more days. She reports you will finish her antibiotics in two days from now.. Patient states that she has been fully compliant with her medications but has not seen an improvement in her symptoms. She is not taking any pain medication other than tramadol which she takes chronically.  Her last bowel movement was this morning; it was normal.  She denies any urinary symptoms, bloody or black tarry stools, or recent travel.   She is not having any nausea or vomiting. She is not having any fevers or chills.  She has previous experience with diverticulitis and reports that her symptoms feel similar at this time.  ROS: See HPI Constitutional: no fever  Eyes: no drainage  ENT: no runny nose   Cardiovascular:  no chest pain  Resp: no SOB  GI: no vomiting GU: no dysuria Integumentary: no rash  Allergy: no hives  Musculoskeletal: no leg swelling  Neurological: no slurred speech ROS otherwise negative  PAST MEDICAL HISTORY/PAST SURGICAL HISTORY:  Past Medical History  Diagnosis Date  . CAD (coronary artery disease)   . History of supraventricular tachycardia   . Palpitations   . Mixed dyslipidemia   . Cerebrovascular disease     Nonobstructive  . Hypertension   . GERD (gastroesophageal reflux disease)   . Chronic  benign neutropenia   . Chronic anxiety   . Osteoarthritis   . IBS (irritable bowel syndrome)   . Obesity   . Atrial fibrillation     MEDICATIONS:  Prior to Admission medications   Medication Sig Start Date End Date Taking? Authorizing Provider  acetaminophen (TYLENOL) 325 MG tablet Take 650 mg by mouth every 6 (six) hours as needed.    Historical Provider, MD  aspirin 81 MG tablet Take 81 mg by mouth daily.      Historical Provider, MD  Cholecalciferol (VITAMIN D-3) 1000 UNITS CAPS Take 1,000 Units by mouth daily.    Historical Provider, MD  diazepam (VALIUM) 5 MG tablet Take 5 mg by mouth at bedtime. 1/2 tablet by mouth daily    Historical Provider, MD  dicyclomine (BENTYL) 10 MG capsule Take 20 mg by mouth 2 (two) times daily.      Historical Provider, MD  esomeprazole (NEXIUM) 40 MG capsule Take 40 mg by mouth daily at 12 noon.    Historical Provider, MD  fish oil-omega-3 fatty acids 1000 MG capsule Take 1 g by mouth daily.    Historical Provider, MD  gabapentin (NEURONTIN) 300 MG capsule Take 300 mg by mouth 3 (three) times daily.      Historical Provider, MD  lisinopril (PRINIVIL,ZESTRIL) 40 MG tablet Take 40 mg by mouth daily.      Historical Provider, MD  meclizine (ANTIVERT) 25 MG tablet Take 25 mg by  mouth 3 (three) times daily as needed.      Historical Provider, MD  metoprolol (TOPROL-XL) 50 MG 24 hr tablet Take 50 mg by mouth daily.     Historical Provider, MD  Multiple Vitamin (MULTIVITAMIN) tablet Take 1 tablet by mouth daily.      Historical Provider, MD  simvastatin (ZOCOR) 20 MG tablet Take 20 mg by mouth at bedtime.      Historical Provider, MD  traMADol (ULTRAM) 50 MG tablet Take 50 mg by mouth 3 (three) times daily.    Historical Provider, MD    ALLERGIES:  Allergies  Allergen Reactions  . Codeine     REACTION: sweating  . Morphine     REACTION: sweating    SOCIAL HISTORY:  History  Substance Use Topics  . Smoking status: Never Smoker   . Smokeless tobacco:  Never Used  . Alcohol Use: No    FAMILY HISTORY: Family History  Problem Relation Age of Onset  . Coronary artery disease Other     EXAM: BP 123/75  Pulse 65  Temp(Src) 98.7 F (37.1 C) (Oral)  Resp 16  Ht 5\' 9"  (1.753 m)  Wt 204 lb (92.534 kg)  BMI 30.11 kg/m2  SpO2 99% CONSTITUTIONAL: Alert and oriented and responds appropriately to questions. Well-appearing; well-nourished; very well appearing, non-toxic, well hydrated HEAD: Normocephalic EYES: Conjunctivae clear, PERRL ENT: normal nose; no rhinorrhea; moist mucous membranes; pharynx without lesions noted NECK: Supple, no meningismus, no LAD  CARD: RRR; S1 and S2 appreciated; no murmurs, no clicks, no rubs, no gallops RESP: Normal chest excursion without splinting or tachypnea; breath sounds clear and equal bilaterally; no wheezes, no rhonchi, no rales,  ABD/GI: Normal bowel sounds; non-distended; soft, tender diffusely throughout lower abdomen, very mildvoluntary guarding in LLQ; no rebound or  Peritoneal signs BACK:  The back appears normal and is non-tender to palpation, there is no CVA tenderness EXT: Normal ROM in all joints; non-tender to palpation; no edema; normal capillary refill; no cyanosis    SKIN: Normal color for age and race; warm NEURO: Moves all extremities equally PSYCH: The patient's mood and manner are appropriate. Grooming and personal hygiene are appropriate.  MEDICAL DECISION MAKING: Patient here with recent diagnosis of diverticulitis with continued pain. Her abdominal exam is relatively benign and she is hemodynamically stable, nontoxic appearing. Will obtain outside records for [facility] and repeat labs in urine. Will give IV fluids, pain and nausea medicine.  Given she is so well appearing, I do not feel she needs a repeat CT scan at this time unless her lab work is significantly changed or her exam worsens.  ED PROGRESS: Pt's labs show no significant change compared to her recent lab work at  [facility]. She has no leukocytosis. She has a normal lactate. No urinary tract infection. CT scan from [facility] hospital report that show mild diverticulitis without perforation or abscess. For example currently does not suggest any complications from diverticulitis. I do not have any suspicion for an abscess given she has no leukocytosis or fever and I am not concerned for perforation given she has no peritoneal signs.  Discussed with patient that I do not feel she needs a repeat CT scan today as I think this is still secondary to her diverticulitis. Will extend her in about a course for another week but also discharge her home with pain medications that she can manage her symptoms a home. Given she is well appearing, non-toxic, tolerating PO, hemodynamically stable, I feel  she can be discharged home. Have discussed with her the importance of PCP follow-up and that she may need a colonoscopy as an outpatient. Have discussed her return precautions. Patient has been verbalize understanding and are comfortable with plan.  I personally performed the services described in this documentation, which was scribed in my presence. The recorded information has been reviewed and is accurate.     Dominique Maw Ward, DO 01/03/14 1310

## 2014-01-06 ENCOUNTER — Emergency Department (HOSPITAL_COMMUNITY): Payer: Medicare Other

## 2014-01-06 ENCOUNTER — Emergency Department (HOSPITAL_COMMUNITY)
Admission: EM | Admit: 2014-01-06 | Discharge: 2014-01-06 | Disposition: A | Discharge status: Home / Self Care | Payer: Medicare Other | Attending: Emergency Medicine | Admitting: Emergency Medicine

## 2014-01-06 ENCOUNTER — Encounter (HOSPITAL_COMMUNITY): Payer: Self-pay | Admitting: Cardiology

## 2014-01-06 DIAGNOSIS — Z8673 Personal history of transient ischemic attack (TIA), and cerebral infarction without residual deficits: Secondary | ICD-10-CM | POA: Insufficient documentation

## 2014-01-06 DIAGNOSIS — K219 Gastro-esophageal reflux disease without esophagitis: Secondary | ICD-10-CM | POA: Diagnosis not present

## 2014-01-06 DIAGNOSIS — B370 Candidal stomatitis: Secondary | ICD-10-CM | POA: Insufficient documentation

## 2014-01-06 DIAGNOSIS — R109 Unspecified abdominal pain: Secondary | ICD-10-CM

## 2014-01-06 DIAGNOSIS — M1990 Unspecified osteoarthritis, unspecified site: Secondary | ICD-10-CM | POA: Insufficient documentation

## 2014-01-06 DIAGNOSIS — K589 Irritable bowel syndrome without diarrhea: Secondary | ICD-10-CM | POA: Insufficient documentation

## 2014-01-06 DIAGNOSIS — E782 Mixed hyperlipidemia: Secondary | ICD-10-CM | POA: Insufficient documentation

## 2014-01-06 DIAGNOSIS — R1032 Left lower quadrant pain: Secondary | ICD-10-CM | POA: Diagnosis not present

## 2014-01-06 DIAGNOSIS — Z79899 Other long term (current) drug therapy: Secondary | ICD-10-CM | POA: Insufficient documentation

## 2014-01-06 DIAGNOSIS — K5712 Diverticulitis of small intestine without perforation or abscess without bleeding: Secondary | ICD-10-CM | POA: Diagnosis not present

## 2014-01-06 DIAGNOSIS — K5792 Diverticulitis of intestine, part unspecified, without perforation or abscess without bleeding: Secondary | ICD-10-CM | POA: Diagnosis not present

## 2014-01-06 DIAGNOSIS — Z9049 Acquired absence of other specified parts of digestive tract: Secondary | ICD-10-CM | POA: Insufficient documentation

## 2014-01-06 DIAGNOSIS — I2510 Atherosclerotic heart disease of native coronary artery without angina pectoris: Secondary | ICD-10-CM | POA: Diagnosis not present

## 2014-01-06 DIAGNOSIS — K5732 Diverticulitis of large intestine without perforation or abscess without bleeding: Secondary | ICD-10-CM | POA: Insufficient documentation

## 2014-01-06 DIAGNOSIS — I10 Essential (primary) hypertension: Secondary | ICD-10-CM | POA: Diagnosis not present

## 2014-01-06 DIAGNOSIS — Z862 Personal history of diseases of the blood and blood-forming organs and certain disorders involving the immune mechanism: Secondary | ICD-10-CM | POA: Insufficient documentation

## 2014-01-06 DIAGNOSIS — R1011 Right upper quadrant pain: Secondary | ICD-10-CM | POA: Diagnosis not present

## 2014-01-06 DIAGNOSIS — I4891 Unspecified atrial fibrillation: Secondary | ICD-10-CM | POA: Insufficient documentation

## 2014-01-06 DIAGNOSIS — Z7982 Long term (current) use of aspirin: Secondary | ICD-10-CM | POA: Insufficient documentation

## 2014-01-06 DIAGNOSIS — R1084 Generalized abdominal pain: Secondary | ICD-10-CM | POA: Diagnosis not present

## 2014-01-06 DIAGNOSIS — Z792 Long term (current) use of antibiotics: Secondary | ICD-10-CM | POA: Diagnosis not present

## 2014-01-06 DIAGNOSIS — F419 Anxiety disorder, unspecified: Secondary | ICD-10-CM | POA: Insufficient documentation

## 2014-01-06 DIAGNOSIS — Z9861 Coronary angioplasty status: Secondary | ICD-10-CM | POA: Insufficient documentation

## 2014-01-06 LAB — CBC WITH DIFFERENTIAL/PLATELET
Basophils Absolute: 0 10*3/uL (ref 0.0–0.1)
Basophils Relative: 0 % (ref 0–1)
EOS PCT: 1 % (ref 0–5)
Eosinophils Absolute: 0 10*3/uL (ref 0.0–0.7)
HEMATOCRIT: 39.6 % (ref 36.0–46.0)
Hemoglobin: 13.6 g/dL (ref 12.0–15.0)
LYMPHS ABS: 0.9 10*3/uL (ref 0.7–4.0)
Lymphocytes Relative: 19 % (ref 12–46)
MCH: 33.4 pg (ref 26.0–34.0)
MCHC: 34.3 g/dL (ref 30.0–36.0)
MCV: 97.3 fL (ref 78.0–100.0)
MONO ABS: 0.4 10*3/uL (ref 0.1–1.0)
Monocytes Relative: 7 % (ref 3–12)
Neutro Abs: 3.6 10*3/uL (ref 1.7–7.7)
Neutrophils Relative %: 73 % (ref 43–77)
Platelets: 176 10*3/uL (ref 150–400)
RBC: 4.07 MIL/uL (ref 3.87–5.11)
RDW: 12.5 % (ref 11.5–15.5)
WBC: 5 10*3/uL (ref 4.0–10.5)

## 2014-01-06 LAB — URINALYSIS, ROUTINE W REFLEX MICROSCOPIC
Bilirubin Urine: NEGATIVE
Glucose, UA: NEGATIVE mg/dL
HGB URINE DIPSTICK: NEGATIVE
Ketones, ur: 15 mg/dL — AB
Leukocytes, UA: NEGATIVE
Nitrite: NEGATIVE
Protein, ur: NEGATIVE mg/dL
Specific Gravity, Urine: 1.02 (ref 1.005–1.030)
Urobilinogen, UA: 0.2 mg/dL (ref 0.0–1.0)
pH: 6.5 (ref 5.0–8.0)

## 2014-01-06 LAB — COMPREHENSIVE METABOLIC PANEL
ALT: 30 U/L (ref 0–35)
AST: 34 U/L (ref 0–37)
Albumin: 4.2 g/dL (ref 3.5–5.2)
Alkaline Phosphatase: 50 U/L (ref 39–117)
Anion gap: 12 (ref 5–15)
BUN: 8 mg/dL (ref 6–23)
CO2: 27 meq/L (ref 19–32)
CREATININE: 0.82 mg/dL (ref 0.50–1.10)
Calcium: 9.6 mg/dL (ref 8.4–10.5)
Chloride: 102 mEq/L (ref 96–112)
GFR, EST AFRICAN AMERICAN: 75 mL/min — AB (ref >90)
GFR, EST NON AFRICAN AMERICAN: 65 mL/min — AB (ref >90)
Glucose, Bld: 102 mg/dL — ABNORMAL HIGH (ref 70–99)
Potassium: 3.8 mEq/L (ref 3.7–5.3)
SODIUM: 141 meq/L (ref 137–147)
Total Bilirubin: 0.9 mg/dL (ref 0.3–1.2)
Total Protein: 7.9 g/dL (ref 6.0–8.3)

## 2014-01-06 MED ORDER — IOHEXOL 300 MG/ML  SOLN
100.0000 mL | Freq: Once | INTRAMUSCULAR | Status: AC | PRN
  Administered 2014-01-06: 100 mL via INTRAVENOUS

## 2014-01-06 MED ORDER — SODIUM CHLORIDE 0.9 % IV SOLN: INTRAVENOUS | Status: DC

## 2014-01-06 MED ORDER — HYDROCODONE-ACETAMINOPHEN 5-325 MG PO TABS: 1.0000 | ORAL_TABLET | Freq: Four times a day (QID) | ORAL | Status: DC | PRN

## 2014-01-06 MED ORDER — AMOXICILLIN-POT CLAVULANATE 500-125 MG PO TABS
1.0000 | ORAL_TABLET | Freq: Once | ORAL | Status: AC
  Administered 2014-01-06: 500 mg via ORAL
  Filled 2014-01-06: qty 1

## 2014-01-06 MED ORDER — ONDANSETRON HCL 4 MG/2ML IJ SOLN
4.0000 mg | Freq: Once | INTRAMUSCULAR | Status: AC
  Administered 2014-01-06: 4 mg via INTRAVENOUS
  Filled 2014-01-06: qty 2

## 2014-01-06 MED ORDER — SODIUM CHLORIDE 0.9 % IV BOLUS (SEPSIS)
250.0000 mL | Freq: Once | INTRAVENOUS | Status: AC
  Administered 2014-01-06: 250 mL via INTRAVENOUS

## 2014-01-06 MED ORDER — AMOXICILLIN-POT CLAVULANATE 500-125 MG PO TABS: 1.0000 | ORAL_TABLET | Freq: Two times a day (BID) | ORAL | Status: DC

## 2014-01-06 MED ORDER — SODIUM CHLORIDE 0.9 % IV SOLN
INTRAVENOUS | Status: DC
  Administered 2014-01-06: 13:00:00 via INTRAVENOUS

## 2014-01-06 MED ORDER — IOHEXOL 300 MG/ML  SOLN
50.0000 mL | Freq: Once | INTRAMUSCULAR | Status: AC | PRN
  Administered 2014-01-06: 50 mL via INTRAVENOUS

## 2014-01-06 NOTE — Discharge Instructions (Signed)
Diverticulitis Diverticulitis is inflammation or infection of small pouches in your colon that form when you have a condition called diverticulosis. The pouches in your colon are called diverticula. Your colon, or large intestine, is where water is absorbed and stool is formed. Complications of diverticulitis can include:  Bleeding.  Severe infection.  Severe pain.  Perforation of your colon.  Obstruction of your colon. CAUSES  Diverticulitis is caused by bacteria. Diverticulitis happens when stool becomes trapped in diverticula. This allows bacteria to grow in the diverticula, which can lead to inflammation and infection. RISK FACTORS People with diverticulosis are at risk for diverticulitis. Eating a diet that does not include enough fiber from fruits and vegetables may make diverticulitis more likely to develop. SYMPTOMS  Symptoms of diverticulitis may include:  Abdominal pain and tenderness. The pain is normally located on the left side of the abdomen, but may occur in other areas.  Fever and chills.  Bloating.  Cramping.  Nausea.  Vomiting.  Constipation.  Diarrhea.  Blood in your stool. DIAGNOSIS  Your health care provider will ask you about your medical history and do a physical exam. You may need to have tests done because many medical conditions can cause the same symptoms as diverticulitis. Tests may include:  Blood tests.  Urine tests.  Imaging tests of the abdomen, including X-rays and CT scans. When your condition is under control, your health care provider may recommend that you have a colonoscopy. A colonoscopy can show how severe your diverticula are and whether something else is causing your symptoms. TREATMENT  Most cases of diverticulitis are mild and can be treated at home. Treatment may include:  Taking over-the-counter pain medicines.  Following a clear liquid diet.  Taking antibiotic medicines by mouth for 7-10 days. More severe cases may  be treated at a hospital. Treatment may include:  Not eating or drinking.  Taking prescription pain medicine.  Receiving antibiotic medicines through an IV tube.  Receiving fluids and nutrition through an IV tube.  Surgery. HOME CARE INSTRUCTIONS   Follow your health care provider's instructions carefully.  Follow a full liquid diet or other diet as directed by your health care provider. After your symptoms improve, your health care provider may tell you to change your diet. He or she may recommend you eat a high-fiber diet. Fruits and vegetables are good sources of fiber. Fiber makes it easier to pass stool.  Take fiber supplements or probiotics as directed by your health care provider.  Only take medicines as directed by your health care provider.  Keep all your follow-up appointments. SEEK MEDICAL CARE IF:   Your pain does not improve.  You have a hard time eating food.  Your bowel movements do not return to normal. SEEK IMMEDIATE MEDICAL CARE IF:   Your pain becomes worse.  Your symptoms do not get better.  Your symptoms suddenly get worse.  You have a fever.  You have repeated vomiting.  You have bloody or black, tarry stools. MAKE SURE YOU:   Understand these instructions.  Will watch your condition.  Will get help right away if you are not doing well or get worse. Document Released: 12/02/2004 Document Revised: 02/27/2013 Document Reviewed: 01/17/2013 [facility] Patient Information 2015 [place], [place]. This information is not intended to replace advice given to you by your health care provider. Make sure you discuss any questions you have with your health care provider.   As we discussed I spoke with Dr. Darrick Penna GI medicine she recommends  switching you over to Augmentin 500 mg twice a day for 5 days. She feels that this will clear up the diverticulitis completely. CT scan showed that it is almost completely resolved. Renewed your pain medicine. Call for  additional follow-up with GI by phone on Monday. Return for any new or worse symptoms. We would not expect you to get worse at all.

## 2014-01-06 NOTE — ED Notes (Addendum)
PT c/o abdominal pain x3 weeks and was seen in the ER twice and has been on antibiotics for 10 days for diverticulitis. PT c/o worsening in abdominal pain at night with a suspected fever.  PT states she had normal BM this am with no black or tarry stools.

## 2014-01-06 NOTE — ED Notes (Signed)
Abdominal pain for 3 weeks.  Seen at [facility] and er and here and was diagnosed with diverticulitis.  Waiting to see Dr. Karilyn Cota.

## 2014-01-06 NOTE — ED Provider Notes (Signed)
CSN: [id number]     Arrival date & time 01/06/14  1004 History  This chart was scribed for Vanetta Mulders, MD by Tonye Royalty, ED Scribe. This patient was seen in room APA06/APA06 and the patient's care was started at 12:46 PM.    Chief Complaint  Patient presents with  . Abdominal Pain   Patient is a 80 y.o. female presenting with abdominal pain. The history is provided by the patient. No language interpreter was used.  Abdominal Pain Pain location:  Generalized (worst in LLQ) Pain radiates to:  Back Pain severity:  Moderate Onset quality:  Gradual Duration:  3 weeks Timing:  Constant Progression:  Unchanged Chronicity:  New Relieved by:  Nothing Worsened by:  Nothing tried Ineffective treatments: Flagyl and Cipro. Associated symptoms: chills and fever   Associated symptoms: no chest pain, no cough, no diarrhea, no dysuria, no hematuria, no nausea, no shortness of breath, no sore throat and no vomiting     HPI Comments: Dominique Matthews is a 80 y.o. female who presents to the Emergency Department complaining of abdominal pain with onset 3 weeks ago. She was referred to Dr. Karilyn Cota on Friday but has not been able to schedule an appointment yet. She has been evaluated multiple for this problem and was first diagnosed with diverticulitis 2 weeks ago at [facility], at which time she had a CT scan. She states her pain is generalized but worst in the LLQ. She reports fever measured at 99.3. She reports feeling diaphoretic, hot, and then cold. She reports associated back pain. She denies nausea or vomiting. She states she is taking Cipro and Flagyl currently and has 3 days left, but she states it does not seem to have improved her symptoms significantly. She states she had not been on any antibiotic prior to that.  Past Medical History  Diagnosis Date  . CAD (coronary artery disease)   . History of supraventricular tachycardia   . Palpitations   . Mixed dyslipidemia   . Cerebrovascular disease      Nonobstructive  . Hypertension   . GERD (gastroesophageal reflux disease)   . Chronic benign neutropenia   . Chronic anxiety   . Osteoarthritis   . IBS (irritable bowel syndrome)   . Obesity   . Atrial fibrillation   . Diverticulitis    Past Surgical History  Procedure Laterality Date  . Appendectomy    . Cholecystectomy    . Carpal tunnel release      Right; Bilateral trigger thumb release surgery  . Knee arthroscopy      Right  . Cardiac stent in 1997      at [facility]   Family History  Problem Relation Age of Onset  . Coronary artery disease Other    History  Substance Use Topics  . Smoking status: Never Smoker   . Smokeless tobacco: Never Used  . Alcohol Use: No   OB History    No data available     Review of Systems  Constitutional: Positive for fever, chills and diaphoresis.  HENT: Negative for rhinorrhea and sore throat.        Thrush on tongue  Eyes: Negative for visual disturbance.  Respiratory: Negative for cough and shortness of breath.   Cardiovascular: Negative for chest pain and leg swelling.  Gastrointestinal: Positive for abdominal pain. Negative for nausea, vomiting and diarrhea.  Genitourinary: Negative for dysuria and hematuria.  Musculoskeletal: Positive for back pain.  Skin: Negative for rash.  Neurological: Negative for headaches.  Hematological: Does not bruise/bleed easily.  Psychiatric/Behavioral: Negative for confusion.      Allergies  Codeine and Morphine  Home Medications   Prior to Admission medications   Medication Sig Start Date End Date Taking? Authorizing Provider  acetaminophen (TYLENOL) 325 MG tablet Take 650 mg by mouth every 6 (six) hours as needed.    Historical Provider, MD  aspirin 81 MG tablet Take 81 mg by mouth daily.      Historical Provider, MD  Cholecalciferol (VITAMIN D-3) 1000 UNITS CAPS Take 1,000 Units by mouth daily.    Historical Provider, MD  ciprofloxacin (CIPRO) 500 MG tablet Take 500 mg  by mouth 2 (two) times daily.    Historical Provider, MD  ciprofloxacin (CIPRO) 500 MG tablet Take 1 tablet (500 mg total) by mouth 2 (two) times daily. 12/31/13   Kristen N Ward, DO  diazepam (VALIUM) 5 MG tablet Take 5 mg by mouth at bedtime. 1/2 tablet by mouth daily    Historical Provider, MD  dicyclomine (BENTYL) 10 MG capsule Take 20 mg by mouth 2 (two) times daily.      Historical Provider, MD  fish oil-omega-3 fatty acids 1000 MG capsule Take 1 g by mouth daily.    Historical Provider, MD  fluconazole (DIFLUCAN) 150 MG tablet Take 150 mg by mouth daily.    Historical Provider, MD  gabapentin (NEURONTIN) 300 MG capsule Take 300 mg by mouth 3 (three) times daily.      Historical Provider, MD  HYDROcodone-acetaminophen (NORCO/VICODIN) 5-325 MG per tablet Take 1 tablet by mouth every 4 (four) hours as needed. 12/31/13   Kristen N Ward, DO  lisinopril (PRINIVIL,ZESTRIL) 40 MG tablet Take 40 mg by mouth daily.      Historical Provider, MD  meclizine (ANTIVERT) 25 MG tablet Take 25 mg by mouth 3 (three) times daily as needed.      Historical Provider, MD  metoprolol (TOPROL-XL) 50 MG 24 hr tablet Take 50 mg by mouth daily.     Historical Provider, MD  metroNIDAZOLE (FLAGYL) 500 MG tablet Take 500 mg by mouth 2 (two) times daily.    Historical Provider, MD  metroNIDAZOLE (FLAGYL) 500 MG tablet Take 1 tablet (500 mg total) by mouth 2 (two) times daily. 12/31/13   Kristen N Ward, DO  Multiple Vitamin (MULTIVITAMIN) tablet Take 1 tablet by mouth daily.      Historical Provider, MD  omeprazole (PRILOSEC) 20 MG capsule Take 20 mg by mouth daily.    Historical Provider, MD  ondansetron (ZOFRAN ODT) 4 MG disintegrating tablet Take 1 tablet (4 mg total) by mouth every 8 (eight) hours as needed for nausea or vomiting. 12/31/13   Kristen N Ward, DO  simvastatin (ZOCOR) 20 MG tablet Take 20 mg by mouth at bedtime.      Historical Provider, MD  traMADol (ULTRAM) 50 MG tablet Take 50 mg by mouth 3 (three) times  daily.    Historical Provider, MD   BP 105/56 mmHg  Pulse 69  Temp(Src) 99.2 F (37.3 C) (Oral)  Resp 18  Ht 5\' 3"  (1.6 m)  Wt 200 lb (90.719 kg)  BMI 35.44 kg/m2  SpO2 95% Physical Exam  Constitutional: She is oriented to person, place, and time. She appears well-developed and well-nourished.  HENT:  Head: Normocephalic and atraumatic.  White coating on tongue  Eyes: Conjunctivae and EOM are normal.  Neck: Normal range of motion. Neck supple.  Cardiovascular: Normal rate and regular rhythm.   Pulmonary/Chest: Effort normal and  breath sounds normal. No respiratory distress. She has no wheezes. She has no rales.  Lungs clear on both sides  Abdominal: Soft. Bowel sounds are normal. She exhibits no distension. There is tenderness (RUQ, LLQ). There is no rebound and no guarding.  Musculoskeletal: Normal range of motion. She exhibits no edema.  Neurological: She is alert and oriented to person, place, and time. No cranial nerve deficit. She exhibits normal muscle tone. Coordination normal.  Skin: Skin is warm and dry.  Psychiatric: She has a normal mood and affect.  Nursing note and vitals reviewed.   ED Course  Procedures (including critical care time)  DIAGNOSTIC STUDIES: Oxygen Saturation is 95% on room air, adequate by my interpretation.    COORDINATION OF CARE: 12:56 PM Discussed treatment plan with patient at beside, including a CT scan to evaluate the status of her diverticulitis. Discussed that if the CT does not show evidence that her diverticulitis has healed, she will likely be admitted. The patient agrees with the plan and has no further questions at this time.   Labs Review Labs Reviewed  COMPREHENSIVE METABOLIC PANEL - Abnormal; Notable for the following:    Glucose, Bld 102 (*)    GFR calc non Af Amer 65 (*)    GFR calc Af Amer 75 (*)    All other components within normal limits  CBC WITH DIFFERENTIAL  URINALYSIS, ROUTINE W REFLEX MICROSCOPIC   Results for  orders placed or performed during the hospital encounter of 01/06/14  CBC with Differential  Result Value Ref Range   WBC 5.0 4.0 - 10.5 K/uL   RBC 4.07 3.87 - 5.11 MIL/uL   Hemoglobin 13.6 12.0 - 15.0 g/dL   HCT 71.0 62.6 - 94.8 %   MCV 97.3 78.0 - 100.0 fL   MCH 33.4 26.0 - 34.0 pg   MCHC 34.3 30.0 - 36.0 g/dL   RDW 54.6 27.0 - 35.0 %   Platelets 176 150 - 400 K/uL   Neutrophils Relative % 73 43 - 77 %   Neutro Abs 3.6 1.7 - 7.7 K/uL   Lymphocytes Relative 19 12 - 46 %   Lymphs Abs 0.9 0.7 - 4.0 K/uL   Monocytes Relative 7 3 - 12 %   Monocytes Absolute 0.4 0.1 - 1.0 K/uL   Eosinophils Relative 1 0 - 5 %   Eosinophils Absolute 0.0 0.0 - 0.7 K/uL   Basophils Relative 0 0 - 1 %   Basophils Absolute 0.0 0.0 - 0.1 K/uL  Comprehensive metabolic panel  Result Value Ref Range   Sodium 141 137 - 147 mEq/L   Potassium 3.8 3.7 - 5.3 mEq/L   Chloride 102 96 - 112 mEq/L   CO2 27 19 - 32 mEq/L   Glucose, Bld 102 (H) 70 - 99 mg/dL   BUN 8 6 - 23 mg/dL   Creatinine, Ser 0.93 0.50 - 1.10 mg/dL   Calcium 9.6 8.4 - 81.8 mg/dL   Total Protein 7.9 6.0 - 8.3 g/dL   Albumin 4.2 3.5 - 5.2 g/dL   AST 34 0 - 37 U/L   ALT 30 0 - 35 U/L   Alkaline Phosphatase 50 39 - 117 U/L   Total Bilirubin 0.9 0.3 - 1.2 mg/dL   GFR calc non Af Amer 65 (L) >90 mL/min   GFR calc Af Amer 75 (L) >90 mL/min   Anion gap 12 5 - 15  Urinalysis, Routine w reflex microscopic  Result Value Ref Range   Color, Urine  YELLOW YELLOW   APPearance CLEAR CLEAR   Specific Gravity, Urine 1.020 1.005 - 1.030   pH 6.5 5.0 - 8.0   Glucose, UA NEGATIVE NEGATIVE mg/dL   Hgb urine dipstick NEGATIVE NEGATIVE   Bilirubin Urine NEGATIVE NEGATIVE   Ketones, ur 15 (A) NEGATIVE mg/dL   Protein, ur NEGATIVE NEGATIVE mg/dL   Urobilinogen, UA 0.2 0.0 - 1.0 mg/dL   Nitrite NEGATIVE NEGATIVE   Leukocytes, UA NEGATIVE NEGATIVE     Imaging Review No results found.   EKG Interpretation None      MDM   Final diagnoses:  None     CT scan shows still some mild diverticulitis. No leukocytosis not febrile here. Abdomen without significant tenderness or guarding. There is some mild tenderness left lower quadrant and some mild tenderness right upper quadrant. Discussed with the on-call GI medicine Dr. Darrick Penna stated patient base on the CT scan and her labs and vital signs does not require admission and although it's not completely resolved. She recommended putting her on Augmentin for the next 5 days. Patient's pain medicine renewed as well. The patient with some persistent diverticulitis despite the oral antibiotics however it is almost completely resolved. Patient is nontoxic no acute distress.  I personally performed the services described in this documentation, which was scribed in my presence. The recorded information has been reviewed and is accurate.     Vanetta Mulders, MD 01/06/14 1537

## 2014-01-09 ENCOUNTER — Encounter (INDEPENDENT_AMBULATORY_CARE_PROVIDER_SITE_OTHER): Payer: Self-pay | Admitting: *Deleted

## 2014-01-09 ENCOUNTER — Telehealth (INDEPENDENT_AMBULATORY_CARE_PROVIDER_SITE_OTHER): Payer: Self-pay | Admitting: *Deleted

## 2014-01-09 NOTE — Telephone Encounter (Signed)
Patient called office and talked with Alden Server. She expressed that she had been seen in the ED and was given antibiotic,which she may have about 2 more days left to take. She also stated that she was given a pain medication and that she was not taking as she does not like to take this medication. Patient feels that she needs to be seen. C/O  Abdominal pain last evening and her abdomen feels warm to touch. She does not know if she is febrile.  Per Dr.Rehman patient should see her PCP and if he has questions he is to page Dr.Rehman. Patient was called and made aware.

## 2014-01-09 NOTE — Telephone Encounter (Signed)
This encounter was created in error - please disregard.

## 2014-01-14 ENCOUNTER — Encounter (INDEPENDENT_AMBULATORY_CARE_PROVIDER_SITE_OTHER): Payer: Self-pay | Admitting: Internal Medicine

## 2014-01-14 ENCOUNTER — Ambulatory Visit (INDEPENDENT_AMBULATORY_CARE_PROVIDER_SITE_OTHER): Payer: Medicare Other | Admitting: Internal Medicine

## 2014-01-14 VITALS — BP 126/64 | HR 60 | Temp 98.3°F | Ht 63.0 in | Wt 198.7 lb

## 2014-01-14 DIAGNOSIS — K5732 Diverticulitis of large intestine without perforation or abscess without bleeding: Secondary | ICD-10-CM | POA: Diagnosis not present

## 2014-01-14 MED ORDER — HYDROCODONE-ACETAMINOPHEN 5-325 MG PO TABS: 1.0000 | ORAL_TABLET | Freq: Four times a day (QID) | ORAL | Status: DC | PRN

## 2014-01-14 MED ORDER — TRAMADOL HCL 50 MG PO TABS: 50.0000 mg | ORAL_TABLET | Freq: Four times a day (QID) | ORAL | Status: DC | PRN

## 2014-01-14 MED ORDER — TRAMADOL HCL 50 MG PO TABS: 50.0000 mg | ORAL_TABLET | Freq: Three times a day (TID) | ORAL | Status: DC

## 2014-01-14 NOTE — Progress Notes (Addendum)
Subjective:    Patient ID: Dominique Matthews, female    DOB: Feb 03, 1930, 80 y.o.   MRN: [id number]  HPI Here today for f/u after recent visit to the ED for diverticulitis. Seen i the ED 12/31/2013 and 01/06/2014. She had abdominal pain which started a bout 3 1/2 weeks ago. On 10/26 Cipro and Flagyl was extended for 5 more days. ED visit 01/06/2014 she was placed on Augmentin for 5 days.  She was evaluated at [facility] 12/25/2013 and CT revealed diverticulitis. Started on Cipro and Flagyl. The pain is in her LLQ. She did have a low grade temp. There was was no nausea or vomiting.  S  Diverticulitis. She tells me she does not feel good.  She feels weak. She has been on a liquid diet or a bland diet. At night, she has LLQ pain.  She says the LLQ pain is better. She is having usually 2-3 a day. No melena or BRRB. She has not had a fever since being seen at [facility].   She says the pain in her LLQ has resolved. She says she does have lower abdominal pain which she describes as hunger pain which occur at night.  Her last colonoscopy was in 2011 by Dr. Gabriel Cirri for personal hx of colonic polyps: normal. (In paper chart)  01/06/2014 CT abdomen/pelvis with CM: IMPRESSION: Near complete resolution of mild distal sigmoid diverticulitis. No evidence of abscess or other acute findings.  Stable large hiatal hernia.  EGD/Colonoscopy 2007 Dr. Karilyn Cota: melena, abdominal pain:  FINAL DIAGNOSIS: 1. Moderate to large sliding hiatal hernia with some mucosal inflammation  of the hernia and part of the stomach but no frank ulceration or  erosions. 2. Markedly swollen friable antral/prepyloric folds, biopsied for  histology, this may be the source of her recent melena. 3. Sigmoid colon diverticulosis. 4. Nonspecific finding of mucosal edema at the sigmoid, biopsy taken from  the sigmoid colon as well as the transverse colon. 5. Small external hemorrhoids. Biopsy:  1. STOMACH, BIOPSY: REACTIVE  GASTROPATHY. SEE COMMENT.  2. TRANSVERSE COLON, BIOPSY: FRAGMENTS OF BENIGN COLONIC MUCOSA.  3. SIGMOID COLON, BIOPSY: FRAGMENTS OF BENIGN COLONIC MUCOSA.  COMMENT 1. This pattern can be associated with nonsteroidal anti-inflammatory drugs (NSAIDS), alcohol or with other causes of chemical gastropathy. Helicobacter pylori are not identified with Warthin-Starry stain. The control stained appropriately.  2,3. There is colorectal mucosa with normal crypt architecture and no objective increase in inflammation. No active inflammation, microscopic colitis, collagenous colitis or significant chronic changes identified. No hyperplastic or adenomatous changes are seen, and there is no evidence of malignancy.   CBC    Component Value Date/Time   WBC 5.0 01/06/2014 1125   RBC 4.07 01/06/2014 1125   HGB 13.6 01/06/2014 1125   HCT 39.6 01/06/2014 1125   PLT 176 01/06/2014 1125   MCV 97.3 01/06/2014 1125   MCH 33.4 01/06/2014 1125   MCHC 34.3 01/06/2014 1125   RDW 12.5 01/06/2014 1125   LYMPHSABS 0.9 01/06/2014 1125   MONOABS 0.4 01/06/2014 1125   EOSABS 0.0 01/06/2014 1125   BASOSABS 0.0 01/06/2014 1125      Review of Systems  Past Medical History  Diagnosis Date  . CAD (coronary artery disease)   . History of supraventricular tachycardia   . Palpitations   . Mixed dyslipidemia   . Cerebrovascular disease     Nonobstructive  . Hypertension   . GERD (gastroesophageal reflux disease)   . Chronic benign neutropenia   . Chronic anxiety   .  Osteoarthritis   . IBS (irritable bowel syndrome)   . Obesity   . Atrial fibrillation   . Diverticulitis     Past Surgical History  Procedure Laterality Date  . Appendectomy    . Cholecystectomy    . Carpal tunnel release      Right; Bilateral trigger thumb release surgery  . Knee arthroscopy      Right  . Cardiac stent in 1997      at [facility]    Allergies  Allergen Reactions  . Codeine     REACTION:  sweating  . Morphine     REACTION: sweating    Current Outpatient Prescriptions on File Prior to Visit  Medication Sig Dispense Refill  . acetaminophen (TYLENOL) 325 MG tablet Take 650 mg by mouth every 6 (six) hours as needed for moderate pain.     Marland Kitchen Alum & Mag Hydroxide-Simeth (MAGIC MOUTHWASH W/LIDOCAINE) SOLN Take 5 mLs by mouth 3 (three) times daily as needed for mouth pain.    Marland Kitchen aspirin 81 MG tablet Take 81 mg by mouth daily.      . Carboxymethylcellulose Sodium (THERATEARS OP) Place 2 drops into both eyes 6 (six) times daily.    . Cholecalciferol (VITAMIN D-3) 1000 UNITS CAPS Take 1,000 Units by mouth daily.    Marland Kitchen dicyclomine (BENTYL) 10 MG capsule Take 10 mg by mouth 2 (two) times daily.     . fish oil-omega-3 fatty acids 1000 MG capsule Take 1 g by mouth daily.    Marland Kitchen gabapentin (NEURONTIN) 300 MG capsule Take 300 mg by mouth 3 (three) times daily.      Marland Kitchen HYDROcodone-acetaminophen (NORCO/VICODIN) 5-325 MG per tablet Take 1-2 tablets by mouth every 6 (six) hours as needed. 14 tablet 0  . lisinopril (PRINIVIL,ZESTRIL) 40 MG tablet Take 40 mg by mouth daily.      . meclizine (ANTIVERT) 25 MG tablet Take 25 mg by mouth 3 (three) times daily as needed for dizziness.     . metoprolol (TOPROL-XL) 50 MG 24 hr tablet Take 50 mg by mouth daily.     . Multiple Vitamin (MULTIVITAMIN) tablet Take 1 tablet by mouth daily.      Marland Kitchen omeprazole (PRILOSEC) 20 MG capsule Take 20 mg by mouth daily.    . simvastatin (ZOCOR) 20 MG tablet Take 20 mg by mouth at bedtime.      . traMADol (ULTRAM) 50 MG tablet Take 50 mg by mouth 3 (three) times daily.    . metroNIDAZOLE (FLAGYL) 500 MG tablet Take 500 mg by mouth 2 (two) times daily.    . metroNIDAZOLE (FLAGYL) 500 MG tablet Take 1 tablet (500 mg total) by mouth 2 (two) times daily. 14 tablet 0   No current facility-administered medications on file prior to visit.        Objective:   Physical Exam Filed Vitals:   01/14/14 1531  Height: 5\' 3"  (1.6 m)    Weight: 198 lb 11.2 oz (90.13 kg)  Alert and oriented. Skin warm and dry. Oral mucosa is moist.   . Sclera anicteric, conjunctivae is pink. Thyroid not enlarged. No cervical lymphadenopathy. Lungs clear. Heart regular rate and rhythm.  Abdomen is soft. Bowel sounds are positive. No hepatomegaly. No abdominal masses felt. No tenderness.  No edema to lower extremities. Slight tenderness lower abdomen.         Assessment & Plan:  Diverticulitis resolving. May need a colonoscopy in a couple of months. Declined today. Diverticular diet given to  patient. Patient reassured.

## 2014-01-14 NOTE — Patient Instructions (Addendum)
OV in 6 weeks. Try introducing chicken, oatmeal, and other mild foods. Diverticular diet given to patient. Patient reassured.

## 2014-02-25 ENCOUNTER — Encounter (INDEPENDENT_AMBULATORY_CARE_PROVIDER_SITE_OTHER): Payer: Self-pay | Admitting: Internal Medicine

## 2014-02-25 ENCOUNTER — Ambulatory Visit (INDEPENDENT_AMBULATORY_CARE_PROVIDER_SITE_OTHER): Payer: Medicare Other | Admitting: Internal Medicine

## 2014-02-25 VITALS — BP 122/70 | HR 64 | Temp 98.0°F | Ht 63.0 in | Wt 203.0 lb

## 2014-02-25 DIAGNOSIS — K5732 Diverticulitis of large intestine without perforation or abscess without bleeding: Secondary | ICD-10-CM | POA: Diagnosis not present

## 2014-02-25 NOTE — Patient Instructions (Signed)
Continue medications. OV in 1 yr

## 2014-02-25 NOTE — Progress Notes (Addendum)
Subjective:    Patient ID: Dominique Matthews, female    DOB: [birth year], 80 y.o.   MRN: [id number]  HPI  Here today for f/u of hx of diverticulitis. She was seen last month for recent bout of diverticulitis. She was evlauated at [facility] 12/25/2013 and CT revealed diverticulitis.  Seen in the ED at [facility] 10/26 and 11/01 for abdominal pain. She finished course of Cipro and Flagyl and on 11/1 she was started on Augmentin x 5 days. She is here today for f/u. She tells me today she is doing good. She is watching what she eats. She is not havening any further abdominal pain. She says she wants to stay on a bland diet and is avoiding foods. Her appetite is good. She has gained 5 pounds since her visit last month.  Her BMs are normal.  She has increased her fiber. She is taking one fiber pill a day.  No melena or BRRB.  Her last colonoscopy   was in 2011 by Dr. Gabriel Cirri.for personal hx of colonic polyps. Normal exam.  (copy in paper chart) She tells me she has had about 4 bouts with diverticulitis.  Hx of cardiac stenting in 1997. Maintained on ASA 81mg .           01/06/2014 CT abdomen/pelvis with CM: IMPRESSION: Near complete resolution of mild distal sigmoid diverticulitis. No evidence of abscess or other acute findings.   Review of Systems Past Medical History  Diagnosis Date  . CAD (coronary artery disease)   . History of supraventricular tachycardia   . Palpitations   . Mixed dyslipidemia   . Cerebrovascular disease     Nonobstructive  . Hypertension   . GERD (gastroesophageal reflux disease)   . Chronic benign neutropenia   . Chronic anxiety   . Osteoarthritis   . IBS (irritable bowel syndrome)   . Obesity   . Atrial fibrillation   . Diverticulitis   . Diverticulitis     Past Surgical History  Procedure Laterality Date  . Appendectomy    . Cholecystectomy    . Carpal tunnel release      Right; Bilateral trigger thumb release surgery  . Knee arthroscopy      Right  . Cardiac  stent in 1997      at [facility]    Allergies  Allergen Reactions  . Codeine     REACTION: sweating  . Morphine     REACTION: sweating    Current Outpatient Prescriptions on File Prior to Visit  Medication Sig Dispense Refill  . acetaminophen (TYLENOL) 325 MG tablet Take 650 mg by mouth every 6 (six) hours as needed for moderate pain.     [facility] Alum & Mag Hydroxide-Simeth (MAGIC MOUTHWASH W/LIDOCAINE) SOLN Take 5 mLs by mouth 3 (three) times daily as needed for mouth pain.    Marland Kitchen aspirin 81 MG tablet Take 81 mg by mouth daily.      . Carboxymethylcellulose Sodium (THERATEARS OP) Place 2 drops into both eyes 6 (six) times daily.    . Cholecalciferol (VITAMIN D-3) 1000 UNITS CAPS Take 1,000 Units by mouth daily.    Marland Kitchen dicyclomine (BENTYL) 10 MG capsule Take 10 mg by mouth 2 (two) times daily.     . fish oil-omega-3 fatty acids 1000 MG capsule Take 1 g by mouth daily.    Marland Kitchen gabapentin (NEURONTIN) 300 MG capsule Take 300 mg by mouth 3 (three) times daily.      Marland Kitchen HYDROcodone-acetaminophen (NORCO/VICODIN) 5-325 MG per tablet  Take 1-2 tablets by mouth every 6 (six) hours as needed. 21 tablet 0  . meclizine (ANTIVERT) 25 MG tablet Take 25 mg by mouth 3 (three) times daily as needed for dizziness.     . metoprolol (TOPROL-XL) 50 MG 24 hr tablet Take 50 mg by mouth daily.     . Multiple Vitamin (MULTIVITAMIN) tablet Take 1 tablet by mouth daily.      Marland Kitchen omeprazole (PRILOSEC) 20 MG capsule Take 20 mg by mouth daily.    . simvastatin (ZOCOR) 20 MG tablet Take 20 mg by mouth at bedtime.      . traMADol (ULTRAM) 50 MG tablet Take 1 tablet (50 mg total) by mouth every 6 (six) hours as needed. 30 tablet 0  . traMADol (ULTRAM) 50 MG tablet Take 1 tablet (50 mg total) by mouth 3 (three) times daily. 30 tablet 0  . lisinopril (PRINIVIL,ZESTRIL) 40 MG tablet Take 40 mg by mouth daily.       No current facility-administered medications on file prior to visit.        Objective:   Physical Exam  Filed  Vitals:   02/25/14 1425  Height: 5\' 3"  (1.6 m)  Weight: 203 lb (92.08 kg)   Alert and oriented. Skin warm and dry. Oral mucosa is moist.   . Sclera anicteric, conjunctivae is pink. Thyroid not enlarged. No cervical lymphadenopathy. Lungs clear. Heart regular rate and rhythm.  Abdomen is soft. Bowel sounds are positive. No hepatomegaly. No abdominal masses felt. No tenderness.  No edema to lower extremities.          Assessment & Plan:  Diverticulitis, resolved. She is 100% better. Continue medications.  OV in 30yr.

## 2014-03-04 ENCOUNTER — Encounter: Payer: Self-pay | Admitting: Cardiovascular Disease

## 2014-03-04 ENCOUNTER — Ambulatory Visit (INDEPENDENT_AMBULATORY_CARE_PROVIDER_SITE_OTHER): Payer: Medicare Other | Admitting: Cardiovascular Disease

## 2014-03-04 VITALS — BP 119/69 | HR 71 | Ht 63.0 in | Wt 203.0 lb

## 2014-03-04 DIAGNOSIS — R002 Palpitations: Secondary | ICD-10-CM

## 2014-03-04 DIAGNOSIS — I2510 Atherosclerotic heart disease of native coronary artery without angina pectoris: Secondary | ICD-10-CM

## 2014-03-04 DIAGNOSIS — I10 Essential (primary) hypertension: Secondary | ICD-10-CM

## 2014-03-04 DIAGNOSIS — E785 Hyperlipidemia, unspecified: Secondary | ICD-10-CM

## 2014-03-04 DIAGNOSIS — Z136 Encounter for screening for cardiovascular disorders: Secondary | ICD-10-CM | POA: Diagnosis not present

## 2014-03-04 NOTE — Patient Instructions (Signed)
Continue all current medications. Your physician wants you to follow up in:  1 year.  You will receive a reminder letter in the mail one-two months in advance.  If you don't receive a letter, please call our office to schedule the follow up appointment   

## 2014-03-04 NOTE — Progress Notes (Signed)
Patient ID: Dominique Matthews, female   DOB: 12-17-1929, 80 y.o.   MRN: [id number]      SUBJECTIVE: This is a pleasant 80 year old female who presents for an annual followup visit. She has a history of coronary artery disease status post stent placement in 1997. She also has intermittent palpitations controlled with metoprolol. She has been hospitalized with atypical chest pain in the past due to a hiatal hernia treated with PPI's. She has been dealing with bouts of diverticulitis in the past 5 weeks. She denies significant chest pain and shortness of breath. She seldom experiences palpitations, and these primarily occur with anxiety attacks. She had been on diazepam but her PCP discontinued it. ECG today shows normal sinus rhythm with no significant ischemic abnormalities.  She has been married for 63 years. Her husband is 37 yrs old.     Review of Systems: As per "subjective", otherwise negative.  Allergies  Allergen Reactions  . Codeine     REACTION: sweating  . Morphine     REACTION: sweating    Current Outpatient Prescriptions  Medication Sig Dispense Refill  . acetaminophen (TYLENOL) 325 MG tablet Take 650 mg by mouth every 6 (six) hours as needed for moderate pain.     Marland Kitchen Alum & Mag Hydroxide-Simeth (MAGIC MOUTHWASH W/LIDOCAINE) SOLN Take 5 mLs by mouth 3 (three) times daily as needed for mouth pain.    Marland Kitchen aspirin 81 MG tablet Take 81 mg by mouth daily.      . Carboxymethylcellulose Sodium (THERATEARS OP) Place 2 drops into both eyes 6 (six) times daily.    . Cholecalciferol (VITAMIN D-3) 1000 UNITS CAPS Take 1,000 Units by mouth daily.    Marland Kitchen dicyclomine (BENTYL) 10 MG capsule Take 10 mg by mouth 2 (two) times daily.     . fish oil-omega-3 fatty acids 1000 MG capsule Take 1 g by mouth daily.    Marland Kitchen gabapentin (NEURONTIN) 300 MG capsule Take 300 mg by mouth 3 (three) times daily.      Marland Kitchen HYDROcodone-acetaminophen (NORCO/VICODIN) 5-325 MG per tablet Take 1-2 tablets by mouth every 6  (six) hours as needed. 21 tablet 0  . lisinopril (PRINIVIL,ZESTRIL) 40 MG tablet Take 40 mg by mouth daily.      . meclizine (ANTIVERT) 25 MG tablet Take 25 mg by mouth 3 (three) times daily as needed for dizziness.     . metoprolol (TOPROL-XL) 50 MG 24 hr tablet Take 50 mg by mouth daily.     . Multiple Vitamin (MULTIVITAMIN) tablet Take 1 tablet by mouth daily.      Marland Kitchen omeprazole (PRILOSEC) 20 MG capsule Take 20 mg by mouth daily.    . simvastatin (ZOCOR) 20 MG tablet Take 20 mg by mouth at bedtime.      . traMADol (ULTRAM) 50 MG tablet Take 1 tablet (50 mg total) by mouth every 6 (six) hours as needed. 30 tablet 0  . traMADol (ULTRAM) 50 MG tablet Take 1 tablet (50 mg total) by mouth 3 (three) times daily. 30 tablet 0   No current facility-administered medications for this visit.    Past Medical History  Diagnosis Date  . CAD (coronary artery disease)   . History of supraventricular tachycardia   . Palpitations   . Mixed dyslipidemia   . Cerebrovascular disease     Nonobstructive  . Hypertension   . GERD (gastroesophageal reflux disease)   . Chronic benign neutropenia   . Chronic anxiety   . Osteoarthritis   . IBS (  irritable bowel syndrome)   . Obesity   . Atrial fibrillation   . Diverticulitis   . Diverticulitis     Past Surgical History  Procedure Laterality Date  . Appendectomy    . Cholecystectomy    . Carpal tunnel release      Right; Bilateral trigger thumb release surgery  . Knee arthroscopy      Right  . Cardiac stent in 1997      at [facility]    History   Social History  . Marital Status: Married    Spouse Name: N/A    Number of Children: N/A  . Years of Education: N/A   Occupational History  . Not on file.   Social History Main Topics  . Smoking status: Never Smoker   . Smokeless tobacco: Never Used  . Alcohol Use: No  . Drug Use: Not on file  . Sexual Activity: Not on file   Other Topics Concern  . Not on file   Social History  Narrative      PHYSICAL EXAM  BP 119/69  Pulse 71 SpO2 97% Weight 203 lb (92.08 kg) Height 5\' 3"  (1.6 m)   General: NAD HEENT: Normal. Neck: No JVD, no thyromegaly. Lungs: Clear to auscultation bilaterally with normal respiratory effort. CV: Nondisplaced PMI.  Regular rate and rhythm, normal S1/S2, no S3/S4, no murmur. No pretibial or periankle edema.  No carotid bruit.  Normal pedal pulses.  Abdomen: Soft, nontender, no hepatosplenomegaly, no distention.  Neurologic: Alert and oriented x 3.  Psych: Normal affect. Skin: Normal. Musculoskeletal: Normal range of motion, no gross deformities. Extremities: No clubbing or cyanosis.   ECG: Most recent ECG reviewed.      ASSESSMENT AND PLAN: 1. CAD: Symptomatically stable. Continue ASA, metoprolol, and statin. 2. Hyperlipidemia: Monitored by Dr. (PCP).  Continue simvastatin 20 mg daily. 3. Palpitations: Controlled with Toprol XL 50 mg daily. No changes. 4. Essential HTN: Controlled on lisinopril 40 mg daily. No changes.  Dispo: f/u 1 year.   Dimas Aguas, M.D., F.A.C.C.

## 2014-04-02 ENCOUNTER — Encounter (INDEPENDENT_AMBULATORY_CARE_PROVIDER_SITE_OTHER): Payer: Self-pay | Admitting: Internal Medicine

## 2014-04-02 ENCOUNTER — Ambulatory Visit (INDEPENDENT_AMBULATORY_CARE_PROVIDER_SITE_OTHER): Payer: Medicare Other | Admitting: Internal Medicine

## 2014-04-02 VITALS — BP 112/56 | HR 80 | Temp 98.2°F | Ht 63.0 in | Wt 205.1 lb

## 2014-04-02 DIAGNOSIS — K5732 Diverticulitis of large intestine without perforation or abscess without bleeding: Secondary | ICD-10-CM | POA: Diagnosis not present

## 2014-04-02 DIAGNOSIS — R350 Frequency of micturition: Secondary | ICD-10-CM

## 2014-04-02 DIAGNOSIS — F411 Generalized anxiety disorder: Secondary | ICD-10-CM | POA: Diagnosis not present

## 2014-04-02 LAB — CBC WITH DIFFERENTIAL/PLATELET
Basophils Absolute: 0 10*3/uL (ref 0.0–0.1)
Basophils Relative: 0 % (ref 0–1)
EOS ABS: 0.1 10*3/uL (ref 0.0–0.7)
EOS PCT: 3 % (ref 0–5)
HCT: 39.9 % (ref 36.0–46.0)
HEMOGLOBIN: 13.4 g/dL (ref 12.0–15.0)
LYMPHS PCT: 29 % (ref 12–46)
Lymphs Abs: 1.4 10*3/uL (ref 0.7–4.0)
MCH: 32.8 pg (ref 26.0–34.0)
MCHC: 33.6 g/dL (ref 30.0–36.0)
MCV: 97.6 fL (ref 78.0–100.0)
MPV: 10.5 fL (ref 8.6–12.4)
Monocytes Absolute: 0.5 10*3/uL (ref 0.1–1.0)
Monocytes Relative: 10 % (ref 3–12)
NEUTROS ABS: 2.7 10*3/uL (ref 1.7–7.7)
Neutrophils Relative %: 58 % (ref 43–77)
PLATELETS: 164 10*3/uL (ref 150–400)
RBC: 4.09 MIL/uL (ref 3.87–5.11)
RDW: 12.9 % (ref 11.5–15.5)
WBC: 4.7 10*3/uL (ref 4.0–10.5)

## 2014-04-02 MED ORDER — METRONIDAZOLE 250 MG PO TABS: 250.0000 mg | ORAL_TABLET | Freq: Three times a day (TID) | ORAL | Status: DC

## 2014-04-02 MED ORDER — DIAZEPAM 2 MG PO TABS: 2.0000 mg | ORAL_TABLET | Freq: Four times a day (QID) | ORAL | Status: DC | PRN

## 2014-04-02 MED ORDER — CIPROFLOXACIN HCL 500 MG PO TABS: 500.0000 mg | ORAL_TABLET | Freq: Two times a day (BID) | ORAL | Status: DC

## 2014-04-02 NOTE — Patient Instructions (Signed)
Labs. Antibiotics. Further recommendations once we have labs back. ,

## 2014-04-02 NOTE — Progress Notes (Addendum)
Subjective:    Patient ID: Dominique Matthews, female    DOB: 1929/10/05, 81 y.o.   MRN: [id number]  HPI Here today for f/u. She was seen last month for f/u of recent bout with diverticulitis. She present today with c/o rt lower quadrant pain shooting up in her epigastric region. and left lower quadrant.  She says she feels bloated. Symptoms for about a month. There has been no fever.  She says she sometimes will get hot and sweaty. Symptoms are worse at night. No problems voiding. Urine does not have a strong odor. She does void a lot. Appetite is okay. No weight loss. She has actually gained 2 pounds since her last visit. She does not over eat because she does not want another bout of diverticulitis.  She eats yogurt every morning and every night.  She is having a BM daily . No melena or BRRB.  Eats prunes daily for constipation. She also is requesting small dose of V[place]um for nervousness     She was evlauated at [facility] 12/25/2013 and CT revealed diverticulitis. Seen in the ED at [facility] 10/26 and 11/01 for abdominal pain. She finished course of Cipro and Flagyl and on 11/1 she was started on Augmentin x 5 days. Her last colonoscopy was in 2011 by Dr. Gabriel Cirri.for personal hx of colonic polyps. Normal exam. (copy in paper chart) She tells me she has had about 4 bouts with diverticulitis.  Hx of cardiac stenting in 1997. Maintained on ASA 81mg .   01/06/2014 CT abdomen/pelvis with CM:  MPRESSION:  Near complete resolution of mild distal sigmoid diverticulitis. No evidence of abscess or other acute findings.     Review of Systems Past Medical History  Diagnosis Date  . CAD (coronary artery disease)   . History of supraventricular tachycardia   . Palpitations   . Mixed dyslipidemia   . Cerebrovascular disease     Nonobstructive  . Hypertension   . GERD (gastroesophageal reflux disease)   . Chronic benign neutropenia   . Chronic anxiety   . Osteoarthritis   . IBS (irritable bowel  syndrome)   . Obesity   . Atrial fibrillation   . Diverticulitis   . Diverticulitis     Past Surgical History  Procedure Laterality Date  . Appendectomy    . Cholecystectomy    . Carpal tunnel release      Right; Bilateral trigger thumb release surgery  . Knee arthroscopy      Right  . Cardiac stent in 1997      at [facility]    Allergies  Allergen Reactions  . Codeine     REACTION: sweating  . Morphine     REACTION: sweating    Current Outpatient Prescriptions on File Prior to Visit  Medication Sig Dispense Refill  . acetaminophen (TYLENOL) 325 MG tablet Take 650 mg by mouth every 6 (six) hours as needed for moderate pain.     [facility] aspirin 81 MG tablet Take 81 mg by mouth daily.      . Carboxymethylcellulose Sodium (THERATEARS OP) Place 2 drops into both eyes 6 (six) times daily.    . Cholecalciferol (VITAMIN D-3) 1000 UNITS CAPS Take 1,000 Units by mouth daily.    Marland Kitchen dicyclomine (BENTYL) 10 MG capsule Take 10 mg by mouth 2 (two) times daily.     . fish oil-omega-3 fatty acids 1000 MG capsule Take 1 g by mouth daily.    Marland Kitchen gabapentin (NEURONTIN) 300 MG capsule Take 300 mg  by mouth 3 (three) times daily.      Marland Kitchen lisinopril (PRINIVIL,ZESTRIL) 40 MG tablet Take 40 mg by mouth daily.      . meclizine (ANTIVERT) 25 MG tablet Take 25 mg by mouth 3 (three) times daily as needed for dizziness.     . metoprolol (TOPROL-XL) 50 MG 24 hr tablet Take 50 mg by mouth daily.     . Multiple Vitamin (MULTIVITAMIN) tablet Take 1 tablet by mouth daily.      Marland Kitchen omeprazole (PRILOSEC) 20 MG capsule Take 20 mg by mouth daily.    . simvastatin (ZOCOR) 20 MG tablet Take 20 mg by mouth at bedtime.      . traMADol (ULTRAM) 50 MG tablet Take 1 tablet (50 mg total) by mouth every 6 (six) hours as needed. 30 tablet 0   No current facility-administered medications on file prior to visit.        Objective:   Physical Exam Filed Vitals:   04/02/14 1444  Height: 5\' 3"  (1.6 m)  Weight: 205 lb 1.6  oz (93.033 kg)   Alert and oriented. Skin warm and dry. Oral mucosa is moist.   . Sclera anicteric, conjunctivae is pink. Thyroid not enlarged. No cervical lymphadenopathy. Lungs clear. Heart regular rate and rhythm.  Abdomen is soft. Bowel sounds are positive. No hepatomegaly. No abdominal masses felt.   No edema to lower extremities. Tenderness rt and left lower abdomen.        Assessment & Plan:  Abdominal pain. Possible diverticulitis. I am going to get a CBC, CMET, Lipase and urinalysis. Am going to treat her empirically for diverticulitis. Rx for Cipro and Flagyl x 10 days.  Rx for Valium 2mg  as needed for anxiety.

## 2014-04-03 LAB — COMPREHENSIVE METABOLIC PANEL
ALT: 25 U/L (ref 0–35)
AST: 21 U/L (ref 0–37)
Albumin: 4.2 g/dL (ref 3.5–5.2)
Alkaline Phosphatase: 68 U/L (ref 39–117)
BUN: 14 mg/dL (ref 6–23)
CHLORIDE: 101 meq/L (ref 96–112)
CO2: 33 mEq/L — ABNORMAL HIGH (ref 19–32)
Calcium: 9.6 mg/dL (ref 8.4–10.5)
Creat: 0.92 mg/dL (ref 0.50–1.10)
GLUCOSE: 96 mg/dL (ref 70–99)
Potassium: 4.6 mEq/L (ref 3.5–5.3)
Sodium: 140 mEq/L (ref 135–145)
Total Bilirubin: 0.9 mg/dL (ref 0.2–1.2)
Total Protein: 7 g/dL (ref 6.0–8.3)

## 2014-04-03 LAB — URINALYSIS
BILIRUBIN URINE: NEGATIVE
GLUCOSE, UA: NEGATIVE mg/dL
Hgb urine dipstick: NEGATIVE
Ketones, ur: NEGATIVE mg/dL
Nitrite: NEGATIVE
Protein, ur: NEGATIVE mg/dL
Specific Gravity, Urine: 1.011 (ref 1.005–1.030)
UROBILINOGEN UA: 0.2 mg/dL (ref 0.0–1.0)
pH: 7 (ref 5.0–8.0)

## 2014-04-03 LAB — LIPASE: Lipase: 70 U/L (ref 0–75)

## 2014-04-08 ENCOUNTER — Telehealth (INDEPENDENT_AMBULATORY_CARE_PROVIDER_SITE_OTHER): Payer: Self-pay | Admitting: *Deleted

## 2014-04-08 NOTE — Telephone Encounter (Signed)
Dominique Matthews is needing to speak with Terri. Said she called Friday and never heard back. The return phone number is [phone].

## 2014-04-09 NOTE — Telephone Encounter (Signed)
Still does not feel better. She will call back Thursday after she has finished the antibiotics

## 2014-04-15 ENCOUNTER — Telehealth (INDEPENDENT_AMBULATORY_CARE_PROVIDER_SITE_OTHER): Payer: Self-pay | Admitting: Internal Medicine

## 2014-04-15 DIAGNOSIS — R110 Nausea: Secondary | ICD-10-CM

## 2014-04-15 DIAGNOSIS — R1013 Epigastric pain: Secondary | ICD-10-CM

## 2014-04-15 LAB — CBC WITH DIFFERENTIAL/PLATELET
BASOS ABS: 0 10*3/uL (ref 0.0–0.1)
BASOS PCT: 1 % (ref 0–1)
Eosinophils Absolute: 0.1 10*3/uL (ref 0.0–0.7)
Eosinophils Relative: 2 % (ref 0–5)
HEMATOCRIT: 40.3 % (ref 36.0–46.0)
Hemoglobin: 13.6 g/dL (ref 12.0–15.0)
LYMPHS PCT: 27 % (ref 12–46)
Lymphs Abs: 1.1 10*3/uL (ref 0.7–4.0)
MCH: 33.3 pg (ref 26.0–34.0)
MCHC: 33.7 g/dL (ref 30.0–36.0)
MCV: 98.8 fL (ref 78.0–100.0)
MONOS PCT: 10 % (ref 3–12)
MPV: 9.9 fL (ref 8.6–12.4)
Monocytes Absolute: 0.4 10*3/uL (ref 0.1–1.0)
NEUTROS ABS: 2.5 10*3/uL (ref 1.7–7.7)
NEUTROS PCT: 60 % (ref 43–77)
PLATELETS: 174 10*3/uL (ref 150–400)
RBC: 4.08 MIL/uL (ref 3.87–5.11)
RDW: 12.9 % (ref 11.5–15.5)
WBC: 4.2 10*3/uL (ref 4.0–10.5)

## 2014-04-15 LAB — COMPREHENSIVE METABOLIC PANEL
ALK PHOS: 65 U/L (ref 39–117)
ALT: 26 U/L (ref 0–35)
AST: 24 U/L (ref 0–37)
Albumin: 4.1 g/dL (ref 3.5–5.2)
BILIRUBIN TOTAL: 0.8 mg/dL (ref 0.2–1.2)
BUN: 13 mg/dL (ref 6–23)
CALCIUM: 9.5 mg/dL (ref 8.4–10.5)
CO2: 29 mEq/L (ref 19–32)
Chloride: 99 mEq/L (ref 96–112)
Creat: 0.86 mg/dL (ref 0.50–1.10)
GLUCOSE: 94 mg/dL (ref 70–99)
Potassium: 4.3 mEq/L (ref 3.5–5.3)
Sodium: 137 mEq/L (ref 135–145)
Total Protein: 6.9 g/dL (ref 6.0–8.3)

## 2014-04-15 MED ORDER — ONDANSETRON HCL 4 MG PO TABS: 4.0000 mg | ORAL_TABLET | Freq: Three times a day (TID) | ORAL | Status: DC | PRN

## 2014-04-15 NOTE — Telephone Encounter (Signed)
C/o epigastric pain. She says this does not feel like a diverticular flare. Urine is dark orange. Will get a CBC, CMET and urine on her.

## 2014-04-15 NOTE — Telephone Encounter (Signed)
Rx for Zofran sent to her pharmacy 

## 2014-04-16 LAB — URINALYSIS
BILIRUBIN URINE: NEGATIVE
Glucose, UA: NEGATIVE mg/dL
HGB URINE DIPSTICK: NEGATIVE
Ketones, ur: NEGATIVE mg/dL
NITRITE: NEGATIVE
Protein, ur: NEGATIVE mg/dL
Specific Gravity, Urine: 1.012 (ref 1.005–1.030)
UROBILINOGEN UA: 0.2 mg/dL (ref 0.0–1.0)
pH: 7 (ref 5.0–8.0)

## 2014-04-19 ENCOUNTER — Telehealth (INDEPENDENT_AMBULATORY_CARE_PROVIDER_SITE_OTHER): Payer: Self-pay | Admitting: Internal Medicine

## 2014-04-19 DIAGNOSIS — K5732 Diverticulitis of large intestine without perforation or abscess without bleeding: Secondary | ICD-10-CM

## 2014-04-19 MED ORDER — CIPROFLOXACIN HCL 500 MG PO TABS: 500.0000 mg | ORAL_TABLET | Freq: Two times a day (BID) | ORAL | Status: DC

## 2014-04-19 MED ORDER — METRONIDAZOLE 250 MG PO TABS: 250.0000 mg | ORAL_TABLET | Freq: Three times a day (TID) | ORAL | Status: DC

## 2014-04-19 NOTE — Telephone Encounter (Signed)
She will call Monday and let me know how she is doing.  Rx for Cipr and Flagyl.

## 2014-04-23 ENCOUNTER — Telehealth (INDEPENDENT_AMBULATORY_CARE_PROVIDER_SITE_OTHER): Payer: Self-pay | Admitting: *Deleted

## 2014-04-23 NOTE — Telephone Encounter (Signed)
Message for Dominique Matthews some better however would like for you to call her having some back.

## 2014-04-24 NOTE — Telephone Encounter (Signed)
She will call back Friday

## 2014-04-26 ENCOUNTER — Telehealth (INDEPENDENT_AMBULATORY_CARE_PROVIDER_SITE_OTHER): Payer: Self-pay | Admitting: Internal Medicine

## 2014-04-26 DIAGNOSIS — R1030 Lower abdominal pain, unspecified: Secondary | ICD-10-CM

## 2014-04-26 DIAGNOSIS — K5732 Diverticulitis of large intestine without perforation or abscess without bleeding: Secondary | ICD-10-CM

## 2014-04-26 NOTE — Telephone Encounter (Signed)
Patient continues to have left and rt lower quadrant pain .Worse at night.  Am going to get a CT to be sure she does not have diverticulitis. Presently taking antibiotics.

## 2014-04-30 ENCOUNTER — Ambulatory Visit (HOSPITAL_COMMUNITY)
Admission: RE | Admit: 2014-04-30 | Discharge: 2014-04-30 | Disposition: A | Discharge status: Home / Self Care | Payer: Medicare Other | Source: Ambulatory Visit | Attending: Internal Medicine | Admitting: Internal Medicine

## 2014-04-30 DIAGNOSIS — K449 Diaphragmatic hernia without obstruction or gangrene: Secondary | ICD-10-CM | POA: Insufficient documentation

## 2014-04-30 DIAGNOSIS — R1032 Left lower quadrant pain: Secondary | ICD-10-CM | POA: Diagnosis not present

## 2014-04-30 DIAGNOSIS — R1030 Lower abdominal pain, unspecified: Secondary | ICD-10-CM

## 2014-04-30 DIAGNOSIS — K5730 Diverticulosis of large intestine without perforation or abscess without bleeding: Secondary | ICD-10-CM | POA: Diagnosis not present

## 2014-04-30 DIAGNOSIS — R1013 Epigastric pain: Secondary | ICD-10-CM | POA: Insufficient documentation

## 2014-04-30 DIAGNOSIS — K5732 Diverticulitis of large intestine without perforation or abscess without bleeding: Secondary | ICD-10-CM

## 2014-04-30 MED ORDER — IOHEXOL 300 MG/ML  SOLN
100.0000 mL | Freq: Once | INTRAMUSCULAR | Status: AC | PRN
  Administered 2014-04-30: 100 mL via INTRAVENOUS

## 2014-05-02 ENCOUNTER — Telehealth (INDEPENDENT_AMBULATORY_CARE_PROVIDER_SITE_OTHER): Payer: Self-pay | Admitting: Internal Medicine

## 2014-05-02 ENCOUNTER — Other Ambulatory Visit (INDEPENDENT_AMBULATORY_CARE_PROVIDER_SITE_OTHER): Payer: Self-pay | Admitting: *Deleted

## 2014-05-02 ENCOUNTER — Encounter (INDEPENDENT_AMBULATORY_CARE_PROVIDER_SITE_OTHER): Payer: Self-pay | Admitting: *Deleted

## 2014-05-02 DIAGNOSIS — G8929 Other chronic pain: Secondary | ICD-10-CM

## 2014-05-02 DIAGNOSIS — R1013 Epigastric pain: Principal | ICD-10-CM

## 2014-05-02 DIAGNOSIS — R140 Abdominal distension (gaseous): Secondary | ICD-10-CM

## 2014-05-02 NOTE — Telephone Encounter (Signed)
Continues to have bloating and pain at night. Pain in her epigastric region. The pain comes and goes. Known hx of large hiatal hernia.    Ann, EGD. Bloating, epigastric pain. Symptoms for over a month.

## 2014-05-02 NOTE — Telephone Encounter (Signed)
EGD sch'd 05/17/14, patient aware

## 2014-05-07 ENCOUNTER — Other Ambulatory Visit (INDEPENDENT_AMBULATORY_CARE_PROVIDER_SITE_OTHER): Payer: Self-pay | Admitting: Internal Medicine

## 2014-05-17 ENCOUNTER — Encounter (HOSPITAL_COMMUNITY)
Admission: RE | Disposition: A | Discharge status: Home / Self Care | Payer: Self-pay | Source: Ambulatory Visit | Attending: Internal Medicine

## 2014-05-17 ENCOUNTER — Encounter (HOSPITAL_COMMUNITY): Payer: Self-pay | Admitting: *Deleted

## 2014-05-17 ENCOUNTER — Ambulatory Visit (HOSPITAL_COMMUNITY)
Admission: RE | Admit: 2014-05-17 | Discharge: 2014-05-17 | Disposition: A | Discharge status: Home / Self Care | Payer: Medicare Other | Source: Ambulatory Visit | Attending: Internal Medicine | Admitting: Internal Medicine

## 2014-05-17 DIAGNOSIS — K449 Diaphragmatic hernia without obstruction or gangrene: Secondary | ICD-10-CM | POA: Diagnosis not present

## 2014-05-17 DIAGNOSIS — Z886 Allergy status to analgesic agent status: Secondary | ICD-10-CM | POA: Diagnosis not present

## 2014-05-17 DIAGNOSIS — G8929 Other chronic pain: Secondary | ICD-10-CM

## 2014-05-17 DIAGNOSIS — M1990 Unspecified osteoarthritis, unspecified site: Secondary | ICD-10-CM | POA: Diagnosis not present

## 2014-05-17 DIAGNOSIS — R1013 Epigastric pain: Secondary | ICD-10-CM

## 2014-05-17 DIAGNOSIS — Z9049 Acquired absence of other specified parts of digestive tract: Secondary | ICD-10-CM | POA: Diagnosis not present

## 2014-05-17 DIAGNOSIS — R1084 Generalized abdominal pain: Secondary | ICD-10-CM | POA: Diagnosis not present

## 2014-05-17 DIAGNOSIS — R110 Nausea: Secondary | ICD-10-CM | POA: Diagnosis not present

## 2014-05-17 DIAGNOSIS — Z7982 Long term (current) use of aspirin: Secondary | ICD-10-CM | POA: Diagnosis not present

## 2014-05-17 DIAGNOSIS — K2950 Unspecified chronic gastritis without bleeding: Secondary | ICD-10-CM | POA: Diagnosis not present

## 2014-05-17 DIAGNOSIS — F419 Anxiety disorder, unspecified: Secondary | ICD-10-CM | POA: Insufficient documentation

## 2014-05-17 DIAGNOSIS — I4891 Unspecified atrial fibrillation: Secondary | ICD-10-CM | POA: Diagnosis not present

## 2014-05-17 DIAGNOSIS — K219 Gastro-esophageal reflux disease without esophagitis: Secondary | ICD-10-CM | POA: Insufficient documentation

## 2014-05-17 DIAGNOSIS — E669 Obesity, unspecified: Secondary | ICD-10-CM | POA: Insufficient documentation

## 2014-05-17 DIAGNOSIS — Z955 Presence of coronary angioplasty implant and graft: Secondary | ICD-10-CM | POA: Insufficient documentation

## 2014-05-17 DIAGNOSIS — I2510 Atherosclerotic heart disease of native coronary artery without angina pectoris: Secondary | ICD-10-CM | POA: Diagnosis not present

## 2014-05-17 DIAGNOSIS — I10 Essential (primary) hypertension: Secondary | ICD-10-CM | POA: Diagnosis not present

## 2014-05-17 DIAGNOSIS — K440 Diaphragmatic hernia with obstruction, without gangrene: Secondary | ICD-10-CM | POA: Diagnosis not present

## 2014-05-17 DIAGNOSIS — Z8719 Personal history of other diseases of the digestive system: Secondary | ICD-10-CM

## 2014-05-17 DIAGNOSIS — K259 Gastric ulcer, unspecified as acute or chronic, without hemorrhage or perforation: Secondary | ICD-10-CM | POA: Diagnosis not present

## 2014-05-17 DIAGNOSIS — K589 Irritable bowel syndrome without diarrhea: Secondary | ICD-10-CM | POA: Diagnosis not present

## 2014-05-17 DIAGNOSIS — E782 Mixed hyperlipidemia: Secondary | ICD-10-CM | POA: Insufficient documentation

## 2014-05-17 DIAGNOSIS — Z8673 Personal history of transient ischemic attack (TIA), and cerebral infarction without residual deficits: Secondary | ICD-10-CM | POA: Diagnosis not present

## 2014-05-17 DIAGNOSIS — R140 Abdominal distension (gaseous): Secondary | ICD-10-CM

## 2014-05-17 DIAGNOSIS — K319 Disease of stomach and duodenum, unspecified: Secondary | ICD-10-CM | POA: Insufficient documentation

## 2014-05-17 DIAGNOSIS — K2900 Acute gastritis without bleeding: Secondary | ICD-10-CM | POA: Diagnosis not present

## 2014-05-17 HISTORY — PX: ESOPHAGOGASTRODUODENOSCOPY: SHX5428

## 2014-05-17 SURGERY — EGD (ESOPHAGOGASTRODUODENOSCOPY)
Anesthesia: Moderate Sedation

## 2014-05-17 MED ORDER — SUCRALFATE 1 G PO TABS: 2.0000 g | ORAL_TABLET | Freq: Every day | ORAL | Status: DC

## 2014-05-17 MED ORDER — MIDAZOLAM HCL 5 MG/5ML IJ SOLN
INTRAMUSCULAR | Status: DC | PRN
  Administered 2014-05-17 (×2): 2 mg via INTRAVENOUS
  Administered 2014-05-17 (×2): 1 mg via INTRAVENOUS

## 2014-05-17 MED ORDER — MEPERIDINE HCL 50 MG/ML IJ SOLN
INTRAMUSCULAR | Status: DC | PRN
  Administered 2014-05-17 (×2): 25 mg via INTRAVENOUS

## 2014-05-17 MED ORDER — BUTAMBEN-TETRACAINE-BENZOCAINE 2-2-14 % EX AERO
INHALATION_SPRAY | CUTANEOUS | Status: DC | PRN
  Administered 2014-05-17: 2 via TOPICAL

## 2014-05-17 MED ORDER — STERILE WATER FOR IRRIGATION IR SOLN
Status: DC | PRN
  Administered 2014-05-17: 08:00:00

## 2014-05-17 MED ORDER — MEPERIDINE HCL 50 MG/ML IJ SOLN
INTRAMUSCULAR | Status: DC
Start: 2014-05-17 — End: 2014-05-17
  Filled 2014-05-17: qty 1

## 2014-05-17 MED ORDER — OMEPRAZOLE 20 MG PO CPDR: 40.0000 mg | DELAYED_RELEASE_CAPSULE | Freq: Every day | ORAL | Status: DC

## 2014-05-17 MED ORDER — MIDAZOLAM HCL 5 MG/5ML IJ SOLN
INTRAMUSCULAR | Status: AC
  Filled 2014-05-17: qty 10

## 2014-05-17 MED ORDER — SODIUM CHLORIDE 0.9 % IV SOLN
INTRAVENOUS | Status: DC
  Administered 2014-05-17: 08:00:00 via INTRAVENOUS

## 2014-05-17 NOTE — H&P (Signed)
Dominique Matthews is an 81 y.o. female.   Chief Complaint: Patient is here for EGD. HPI: Patient is a 30-year-old Caucasian female who presents with daily nausea abdominal pain which starts in the lower abdomen and migrates to upper abdomen epigastric region. She has history of GERD with large hiatal hernia as well as history of IBS and recently treated for diverticulitis in 2 occasions. She denies vomiting dysphagia melena or rectal bleeding. She remains on dicyclomine 10 mg twice daily. She has fair appetite. She did lose a few pounds when she was sick with diverticulitis. She is on low-dose aspirin but does not take other NSAIDs.  Past Medical History  Diagnosis Date  . CAD (coronary artery disease)   . History of supraventricular tachycardia   . Palpitations   . Mixed dyslipidemia   . Cerebrovascular disease     Nonobstructive  . Hypertension   . GERD (gastroesophageal reflux disease)   . Chronic benign neutropenia   . Chronic anxiety   . Osteoarthritis   . IBS (irritable bowel syndrome)   . Obesity   . Atrial fibrillation   . Diverticulitis   . Diverticulitis     Past Surgical History  Procedure Laterality Date  . Appendectomy    . Cholecystectomy    . Carpal tunnel release      Right; Bilateral trigger thumb release surgery  . Knee arthroscopy      Right  . Cardiac stent in 1997      at [facility]  . Coronary angioplasty      stent    Family History  Problem Relation Age of Onset  . Coronary artery disease Other    Social History:  reports that she has never smoked. She has never used smokeless tobacco. She reports that she does not drink alcohol. Her drug history is not on file.  Allergies:  Allergies  Allergen Reactions  . Codeine     REACTION: sweating  . Morphine     REACTION: sweating    Medications Prior to Admission  Medication Sig Dispense Refill  . acetaminophen (TYLENOL) 500 MG tablet Take 500 mg by mouth every 6 (six) hours as needed for  moderate pain.    Marland Kitchen aspirin 81 MG tablet Take 81 mg by mouth daily.      . Carboxymethylcellulose Sodium (THERATEARS OP) Place 2 drops into both eyes 6 (six) times daily.    . Cholecalciferol (VITAMIN D-3) 1000 UNITS CAPS Take 1,000 Units by mouth daily.    . diazepam (VALIUM) 2 MG tablet Take 1 tablet (2 mg total) by mouth every 6 (six) hours as needed for anxiety. 30 tablet 0  . dicyclomine (BENTYL) 10 MG capsule Take 10 mg by mouth 2 (two) times daily.     . diphenhydramine-acetaminophen (TYLENOL PM) 25-500 MG TABS Take 2 tablets by mouth at bedtime.    . fish oil-omega-3 fatty acids 1000 MG capsule Take 1 g by mouth daily.    Marland Kitchen gabapentin (NEURONTIN) 300 MG capsule Take 300 mg by mouth 3 (three) times daily.      Marland Kitchen lisinopril (PRINIVIL,ZESTRIL) 40 MG tablet Take 40 mg by mouth daily.      . meclizine (ANTIVERT) 25 MG tablet Take 25 mg by mouth 3 (three) times daily as needed for dizziness.     . metoprolol (TOPROL-XL) 50 MG 24 hr tablet Take 50 mg by mouth daily.     . Multiple Vitamin (MULTIVITAMIN) tablet Take 1 tablet by mouth daily.      Marland Kitchen  omeprazole (PRILOSEC) 20 MG capsule Take 20 mg by mouth daily.    . ondansetron (ZOFRAN) 4 MG tablet TAKE 1 TABLET EVERY 8 HOURS AS NEEDED FOR NAUSEA 30 tablet 0  . simvastatin (ZOCOR) 20 MG tablet Take 20 mg by mouth at bedtime.      . traMADol (ULTRAM) 50 MG tablet Take 1 tablet (50 mg total) by mouth every 6 (six) hours as needed. (Patient taking differently: Take 50 mg by mouth every 6 (six) hours as needed for moderate pain. ) 30 tablet 0  . ciprofloxacin (CIPRO) 500 MG tablet Take 1 tablet (500 mg total) by mouth 2 (two) times daily. (Patient not taking: Reported on 05/03/2014) 20 tablet 0  . metroNIDAZOLE (FLAGYL) 250 MG tablet Take 1 tablet (250 mg total) by mouth 3 (three) times daily. (Patient not taking: Reported on 05/03/2014) 30 tablet 0    No results found for this or any previous visit (from the past 48 hour(s)). No results  found.  ROS  Blood pressure 140/76, pulse 72, temperature 98 F (36.7 C), temperature source Oral, resp. rate 18, SpO2 95 %. Physical Exam  Constitutional: She appears well-developed and well-nourished.  HENT:  Mouth/Throat: Oropharynx is clear and moist.  Eyes: Conjunctivae are normal. No scleral icterus.  Neck: No thyromegaly present.  Cardiovascular: Normal rate, regular rhythm and normal heart sounds.   No murmur heard. Respiratory: Effort normal and breath sounds normal.  GI: Soft.  Mild tenderness in right upper quadrant, epigastrium left upper and left lower quadrant. No organomegaly or masses.  Musculoskeletal: She exhibits no edema.  She has a bruise over right arm  Lymphadenopathy:    She has no cervical adenopathy.  Neurological: She is alert.  Skin: Skin is warm and dry.     Assessment/Plan Nausea and abdominal pain. History of GERD with large hiatal hernia. Diagnostic EGD.  Malakhi Markwood U 05/17/2014, 7:46 AM

## 2014-05-17 NOTE — Discharge Instructions (Signed)
Increase omeprazole to 40 mg by mouth 30 minutes before breakfast. Resume aspirin on 05/20/2014 and other medications as before. Take dicyclomine 10 mg before breakfast and before evening meal or before breakfast and lunch. Sucralfate 2 g by mouth daily at bedtime. Resume usual diet. May consider 6 small meals rather than 3 regular meals. No driving for 24 hours. Patient will call with biopsy results.   Gastrointestinal Endoscopy, Care After Refer to this sheet in the next few weeks. These instructions provide you with information on caring for yourself after your procedure. Your caregiver may also give you more specific instructions. Your treatment has been planned according to current medical practices, but problems sometimes occur. Call your caregiver if you have any problems or questions after your procedure. HOME CARE INSTRUCTIONS  If you were given medicine to help you relax (sedative), do not drive, operate machinery, or sign important documents for 24 hours.  Avoid alcohol and hot or warm beverages for the first 24 hours after the procedure.  Only take over-the-counter or prescription medicines for pain, discomfort, or fever as directed by your caregiver. You may resume taking your normal medicines unless your caregiver tells you otherwise. Ask your caregiver when you may resume taking medicines that may cause bleeding, such as aspirin, clopidogrel, or warfarin.  You may return to your normal diet and activities on the day after your procedure, or as directed by your caregiver. Walking may help to reduce any bloated feeling in your abdomen.  Drink enough fluids to keep your urine clear or pale yellow.  You may gargle with salt water if you have a sore throat. SEEK IMMEDIATE MEDICAL CARE IF:  You have severe nausea or vomiting.  You have severe abdominal pain, abdominal cramps that last longer than 6 hours, or abdominal swelling (distention).  You have severe shoulder or back  pain.  You have trouble swallowing.  You have shortness of breath, your breathing is shallow, or you are breathing faster than normal.  You have a fever or a rapid heartbeat.  You vomit blood or material that looks like coffee grounds.  You have bloody, black, or tarry stools. MAKE SURE YOU:  Understand these instructions.  Will watch your condition.  Will get help right away if you are not doing well or get worse. Document Released: 10/07/2003 Document Revised: 07/09/2013 Document Reviewed: 05/25/2011 [facility] Patient Information 2015 [place], [place]. This information is not intended to replace advice given to you by your health care provider. Make sure you discuss any questions you have with your health care provider.

## 2014-05-17 NOTE — Op Note (Signed)
EGD PROCEDURE REPORT  PATIENT:  Dominique Matthews  MR#:  [id number] Birthdate:  January 01, 1930, 81 y.o., female Endoscopist:  Dr. Malissa Hippo, MD Referred By:  Dr. Selinda Flavin, MD  Procedure Date: 05/17/2014  Procedure:   EGD  Indications: Patient is 81 year old Caucasian female who has known large hiatal hernia who presents with daily nausea lower abdominal pain radiating up and epigastric region as well as postprandial bloating and decrease her appetite. She was recently treated for diverticulitis with resolution of lower abdominal pain but not the symptoms. She is undergoing diagnostic EGD.            Informed Consent:  The risks, benefits, alternatives & imponderables which include, but are not limited to, bleeding, infection, perforation, drug reaction and potential missed lesion have been reviewed.  The potential for biopsy, lesion removal, esophageal dilation, etc. have also been discussed.  Questions have been answered.  All parties agreeable.  Please see history & physical in medical record for more information.  Medications:  Demerol 50 mg IV Versed 6 mg IV Cetacaine spray topically for oropharyngeal anesthesia  Description of procedure:  The endoscope was introduced through the mouth and advanced to the second portion of the duodenum without difficulty or limitations. The mucosal surfaces were surveyed very carefully during advancement of the scope and upon withdrawal.  Findings:  Esophagus:  Mucosa of the esophagus was normal. GE junction was unremarkable. GEJ:  32 cm Stomach:  Large amount of bile noted in the proximal stomach which was felt to be intrathoracic. 80 m fold with erosion close to GE junction. Few small hyperplastic appearing polyps at gastric body. Difficulty encountered in advancing the scope into antrum and pyloric channel. Antral mucosa revealed patchy and linear erythema. Pyloric channel was patent. Duodenum:  Normal bulbar and post bulbar  mucosa.  Therapeutic/Diagnostic Maneuvers Performed:  Gastric biopsy taken from inflamed mucosa below the cardia and gastric body.  Complications:  None  Impression: No evidence of erosive esophagitis. Large hiatal hernia with organoaxial rotation resulting in difficulty advancing the scope into distal stomach. Proximal stomach for of bile. Gastritis with erosion.  Recommendations:  Increase omeprazole to 40 mg by mouth every morning. Sucralfate 2 g by mouth daily at bedtime. I will be contacting patient with biopsy results and further recommendations.  Keyle Doby U  05/17/2014  8:23 AM  CC: Dr. Selinda Flavin, MD & Dr. Bonnetta Barry ref. provider found

## 2014-05-20 ENCOUNTER — Encounter (HOSPITAL_COMMUNITY): Payer: Self-pay | Admitting: Internal Medicine

## 2014-05-23 ENCOUNTER — Encounter (INDEPENDENT_AMBULATORY_CARE_PROVIDER_SITE_OTHER): Payer: Self-pay | Admitting: *Deleted

## 2014-05-24 ENCOUNTER — Other Ambulatory Visit (INDEPENDENT_AMBULATORY_CARE_PROVIDER_SITE_OTHER): Payer: Self-pay | Admitting: Internal Medicine

## 2014-05-24 ENCOUNTER — Encounter (INDEPENDENT_AMBULATORY_CARE_PROVIDER_SITE_OTHER): Payer: Self-pay | Admitting: *Deleted

## 2014-05-28 DIAGNOSIS — K219 Gastro-esophageal reflux disease without esophagitis: Secondary | ICD-10-CM | POA: Diagnosis not present

## 2014-05-28 DIAGNOSIS — E780 Pure hypercholesterolemia: Secondary | ICD-10-CM | POA: Diagnosis not present

## 2014-05-28 DIAGNOSIS — I10 Essential (primary) hypertension: Secondary | ICD-10-CM | POA: Diagnosis not present

## 2014-06-25 ENCOUNTER — Ambulatory Visit (INDEPENDENT_AMBULATORY_CARE_PROVIDER_SITE_OTHER): Payer: Medicare Other | Admitting: Internal Medicine

## 2014-06-25 ENCOUNTER — Encounter (INDEPENDENT_AMBULATORY_CARE_PROVIDER_SITE_OTHER): Payer: Self-pay | Admitting: Internal Medicine

## 2014-06-25 VITALS — BP 130/80 | HR 68 | Temp 98.4°F | Resp 18 | Ht 63.0 in | Wt 209.3 lb

## 2014-06-25 DIAGNOSIS — K219 Gastro-esophageal reflux disease without esophagitis: Secondary | ICD-10-CM | POA: Diagnosis not present

## 2014-06-25 DIAGNOSIS — R110 Nausea: Secondary | ICD-10-CM | POA: Diagnosis not present

## 2014-06-25 DIAGNOSIS — K589 Irritable bowel syndrome without diarrhea: Secondary | ICD-10-CM

## 2014-06-25 MED ORDER — ONDANSETRON HCL 4 MG PO TABS: 4.0000 mg | ORAL_TABLET | Freq: Two times a day (BID) | ORAL | Status: DC | PRN

## 2014-06-25 NOTE — Progress Notes (Signed)
Presenting complaint;  Follow for GERD and IBS.  Subjective:  Patient is 81 year old Caucasian female who presents for scheduled visit. She underwent EGD on 05/17/2014 because of nausea postprandial bloating epigastric pain decreases her appetite and was found to have large hiatal hernia with organ axial rotation gastritis with erosion and bile in herniated part of the stomach. Gastric biopsy revealed reactive changes and granulation tissue but no evidence of H. pylori infection. Patient was asked to take omeprazole in the morning and sucralfate was added to be taken at bedtime. She is also on dicyclomine for lower abdominal pain/IBS. She feels much better. She has had few episodes of nausea since EGD. She denies vomiting. She has not woken up in the middle of night with regurgitation. She has had and of bed elevated by 6 inches. She still has intermittent lower abdominal pain with bloating but denies diarrhea melena or rectal bleeding. She complains of extreme anxiety. She states she was begun on diazepam by her cardiologist Dr. Dimas Aguas does not want to take this medication because of fear of falling. She's also having difficulty sleeping which she does not feel is due to GERD.   Current Medications: Outpatient Encounter Prescriptions as of 06/25/2014  Medication Sig  . acetaminophen (TYLENOL) 500 MG tablet Take 500 mg by mouth every 6 (six) hours as needed for moderate pain.  Marland Kitchen aspirin 81 MG tablet Take 81 mg by mouth daily.    . Cholecalciferol (VITAMIN D-3) 1000 UNITS CAPS Take 1,000 Units by mouth daily.  . diazepam (VALIUM) 2 MG tablet Take 1 tablet (2 mg total) by mouth every 6 (six) hours as needed for anxiety.  . dicyclomine (BENTYL) 10 MG capsule Take 10 mg by mouth 2 (two) times daily.   . fish oil-omega-3 fatty acids 1000 MG capsule Take 1 g by mouth daily.  Marland Kitchen gabapentin (NEURONTIN) 300 MG capsule Take 300 mg by mouth 3 (three) times daily.    Marland Kitchen lisinopril (PRINIVIL,ZESTRIL) 40 MG  tablet Take 40 mg by mouth daily.    . meclizine (ANTIVERT) 25 MG tablet Take 25 mg by mouth 3 (three) times daily as needed for dizziness.   . metoprolol (TOPROL-XL) 50 MG 24 hr tablet Take 50 mg by mouth daily.   . Multiple Vitamin (MULTIVITAMIN) tablet Take 1 tablet by mouth daily.    Marland Kitchen omeprazole (PRILOSEC) 20 MG capsule Take 2 capsules (40 mg total) by mouth daily.  . ondansetron (ZOFRAN) 4 MG tablet TAKE 1 TABLET EVERY 8 HOURS AS NEEDED FOR NAUSEA  . simvastatin (ZOCOR) 20 MG tablet Take 20 mg by mouth at bedtime.    . sucralfate (CARAFATE) 1 G tablet Take 2 tablets (2 g total) by mouth at bedtime.  . traMADol (ULTRAM) 50 MG tablet Take 1 tablet (50 mg total) by mouth every 6 (six) hours as needed. (Patient taking differently: Take 50 mg by mouth every 6 (six) hours as needed for moderate pain. )  . Carboxymethylcellulose Sodium (THERATEARS OP) Place 2 drops into both eyes 6 (six) times daily.  . [DISCONTINUED] diphenhydramine-acetaminophen (TYLENOL PM) 25-500 MG TABS Take 2 tablets by mouth at bedtime.     Objective: Blood pressure 130/80, pulse 68, temperature 98.4 F (36.9 C), temperature source Oral, resp. rate 18, height 5\' 3"  (1.6 m), weight 209 lb 4.8 oz (94.938 kg). Patient is alert and in no acute distress. Conjunctiva is pink. Sclera is nonicteric Oropharyngeal mucosa is normal. No neck masses or thyromegaly noted. Cardiac exam with regular rhythm normal  S1 and S2. Faint systolic ejection murmur noted at left lower sternal border Lungs are clear to auscultation. Abdomen is full. On palpation is soft with mild tenderness in RLQ but no organomegaly or masses. No LE edema or clubbing noted.    Assessment:  #1. GERD. She has large sliding hiatal hernia with organoaxial rotation. Symptoms appear to be well controlled with PPI and bedtime sucralfate. #2. Irritable bowel syndrome. She has intermittent right lower quadrant abdominal pain with bloating. She is having much less  pain with dicyclomine which she is tolerating well. #3. Anxiety. She is under a lot of stress and unable to sleep at night. She requested prescription for diazepam. She was advised to review her symptoms with Dr. Selinda Flavin and let him make recommendations. #4. Intermittent nausea secondary to GERD and anxiety/stress.   Plan:  New prescription given for ondansetron 4 mg by mouth twice a day when necessary. Patient will continue omeprazole 40 mg by mouth every morning and sucralfate 2 g by mouth daily at bedtime. Office visit in 4 months.

## 2014-06-27 ENCOUNTER — Encounter (INDEPENDENT_AMBULATORY_CARE_PROVIDER_SITE_OTHER): Payer: Self-pay | Admitting: Internal Medicine

## 2014-07-01 DIAGNOSIS — M25562 Pain in left knee: Secondary | ICD-10-CM | POA: Diagnosis not present

## 2014-07-01 DIAGNOSIS — M179 Osteoarthritis of knee, unspecified: Secondary | ICD-10-CM | POA: Diagnosis not present

## 2014-07-30 DIAGNOSIS — H538 Other visual disturbances: Secondary | ICD-10-CM | POA: Diagnosis not present

## 2014-07-30 DIAGNOSIS — H2513 Age-related nuclear cataract, bilateral: Secondary | ICD-10-CM | POA: Diagnosis not present

## 2014-08-08 ENCOUNTER — Ambulatory Visit (INDEPENDENT_AMBULATORY_CARE_PROVIDER_SITE_OTHER): Payer: Medicare Other | Admitting: Internal Medicine

## 2014-08-08 ENCOUNTER — Encounter (INDEPENDENT_AMBULATORY_CARE_PROVIDER_SITE_OTHER): Payer: Self-pay | Admitting: Internal Medicine

## 2014-08-08 VITALS — BP 108/70 | HR 84 | Temp 97.4°F | Ht 65.0 in | Wt 206.9 lb

## 2014-08-08 DIAGNOSIS — K5732 Diverticulitis of large intestine without perforation or abscess without bleeding: Secondary | ICD-10-CM

## 2014-08-08 DIAGNOSIS — K5792 Diverticulitis of intestine, part unspecified, without perforation or abscess without bleeding: Secondary | ICD-10-CM | POA: Diagnosis not present

## 2014-08-08 LAB — COMPREHENSIVE METABOLIC PANEL
ALT: 19 U/L (ref 0–35)
AST: 22 U/L (ref 0–37)
Albumin: 4.3 g/dL (ref 3.5–5.2)
Alkaline Phosphatase: 57 U/L (ref 39–117)
BILIRUBIN TOTAL: 1.4 mg/dL — AB (ref 0.2–1.2)
BUN: 15 mg/dL (ref 6–23)
CHLORIDE: 102 meq/L (ref 96–112)
CO2: 28 mEq/L (ref 19–32)
CREATININE: 0.86 mg/dL (ref 0.50–1.10)
Calcium: 9.5 mg/dL (ref 8.4–10.5)
GLUCOSE: 99 mg/dL (ref 70–99)
POTASSIUM: 4.4 meq/L (ref 3.5–5.3)
SODIUM: 140 meq/L (ref 135–145)
Total Protein: 6.9 g/dL (ref 6.0–8.3)

## 2014-08-08 LAB — CBC WITH DIFFERENTIAL/PLATELET
BASOS PCT: 0 % (ref 0–1)
Basophils Absolute: 0 10*3/uL (ref 0.0–0.1)
EOS PCT: 2 % (ref 0–5)
Eosinophils Absolute: 0.1 10*3/uL (ref 0.0–0.7)
HCT: 40.4 % (ref 36.0–46.0)
HEMOGLOBIN: 13.7 g/dL (ref 12.0–15.0)
LYMPHS ABS: 1.1 10*3/uL (ref 0.7–4.0)
Lymphocytes Relative: 22 % (ref 12–46)
MCH: 33 pg (ref 26.0–34.0)
MCHC: 33.9 g/dL (ref 30.0–36.0)
MCV: 97.3 fL (ref 78.0–100.0)
MPV: 10.4 fL (ref 8.6–12.4)
Monocytes Absolute: 0.6 10*3/uL (ref 0.1–1.0)
Monocytes Relative: 12 % (ref 3–12)
Neutro Abs: 3.1 10*3/uL (ref 1.7–7.7)
Neutrophils Relative %: 64 % (ref 43–77)
Platelets: 181 10*3/uL (ref 150–400)
RBC: 4.15 MIL/uL (ref 3.87–5.11)
RDW: 13.1 % (ref 11.5–15.5)
WBC: 4.8 10*3/uL (ref 4.0–10.5)

## 2014-08-08 MED ORDER — METRONIDAZOLE 250 MG PO TABS: 250.0000 mg | ORAL_TABLET | Freq: Three times a day (TID) | ORAL | Status: DC

## 2014-08-08 MED ORDER — CIPROFLOXACIN HCL 500 MG PO TABS: 500.0000 mg | ORAL_TABLET | Freq: Two times a day (BID) | ORAL | Status: DC

## 2014-08-08 NOTE — Progress Notes (Addendum)
Subjective:    Patient ID: Dominique Matthews, female    DOB: [birth year], 81 y.o.   MRN: [id number]  HPI She presents today she cannot sleep at night. She has pain across her lower abdomen. She says she feels nauseated and weak. She has been sick since Sunday. She has not had a fever as far as she knows. She is trying to eat. She has not vomited. She says she is dizzy when she stands.  Her BMs have been normal up till today. Stool was loose this am.  She tries not to get constipated.  She says this does not feel like diverticulitis.  She says she had a virus about a month ago (Mother's day). No acid reflux.  She feels bloated.   05/17/2014 EGD:    Indications: Patient is 81 year old Caucasian female who has known large hiatal hernia who presents with daily nausea lower abdominal pain radiating up and epigastric region as well as postprandial bloating and decrease her appetite. She was recently treated for diverticulitis with resolution of lower abdominal pain but not the symptoms. Impression: No evidence of erosive esophagitis. Large hiatal hernia with organoaxial rotation resulting in difficulty advancing the scope into distal stomach. Proximal stomach for of bile. Gastritis with erosion. Gastric biopsy shows reactive changes but no evidence of H. pylori infection.  05/01/2014 CT abdomen/pelvis with CM  IMPRESSION: 1. No acute or inflammatory process identified in the abdomen or pelvis. 2. Diverticulosis of the sigmoid colon without active inflammation. 3. Large chronic gastric and mesentery containing hiatal hernia with partially visible intra thoracic stomach.    01/06/2014 CT abdomen/pelvis with CM:  MPRESSION:  Near complete resolution of mild distal sigmoid diverticulitis. No evidence of abscess or other acute findings.    Review of Systems Past Medical History  Diagnosis Date  . CAD (coronary artery disease)   . History of supraventricular tachycardia   . Palpitations     . Mixed dyslipidemia   . Cerebrovascular disease     Nonobstructive  . Hypertension   . GERD (gastroesophageal reflux disease)   . Chronic benign neutropenia   . Chronic anxiety   . Osteoarthritis   . IBS (irritable bowel syndrome)   . Obesity   . Atrial fibrillation   . Diverticulitis   . Diverticulitis     Past Surgical History  Procedure Laterality Date  . Appendectomy    . Cholecystectomy    . Carpal tunnel release      Right; Bilateral trigger thumb release surgery  . Knee arthroscopy      Right  . Cardiac stent in 1997      at [facility]  . Coronary angioplasty      stent  . Esophagogastroduodenoscopy N/A 05/17/2014    Procedure: ESOPHAGOGASTRODUODENOSCOPY (EGD);  Surgeon: Malissa Hippo, MD;  Location: [facility]ITE;  Service: Endoscopy;  Laterality: N/A;  730    Allergies  Allergen Reactions  . Codeine     REACTION: sweating  . Morphine     REACTION: sweating    Current Outpatient Prescriptions on File Prior to Visit  Medication Sig Dispense Refill  . acetaminophen (TYLENOL) 500 MG tablet Take 500 mg by mouth every 6 (six) hours as needed for moderate pain.    Marland Kitchen aspirin 81 MG tablet Take 81 mg by mouth daily.      . Carboxymethylcellulose Sodium (THERATEARS OP) Place 2 drops into both eyes 6 (six) times daily.    . Cholecalciferol (VITAMIN D-3) 1000 UNITS  CAPS Take 1,000 Units by mouth daily.    . diazepam (VALIUM) 2 MG tablet Take 1 tablet (2 mg total) by mouth every 6 (six) hours as needed for anxiety. 30 tablet 0  . dicyclomine (BENTYL) 10 MG capsule Take 10 mg by mouth 2 (two) times daily.     . fish oil-omega-3 fatty acids 1000 MG capsule Take 1 g by mouth daily.    Marland Kitchen gabapentin (NEURONTIN) 300 MG capsule Take 300 mg by mouth 3 (three) times daily.      Marland Kitchen lisinopril (PRINIVIL,ZESTRIL) 40 MG tablet Take 40 mg by mouth daily.      . meclizine (ANTIVERT) 25 MG tablet Take 25 mg by mouth 3 (three) times daily as needed for dizziness.     .  metoprolol (TOPROL-XL) 50 MG 24 hr tablet Take 50 mg by mouth daily.     . Multiple Vitamin (MULTIVITAMIN) tablet Take 1 tablet by mouth daily.      Marland Kitchen omeprazole (PRILOSEC) 20 MG capsule Take 2 capsules (40 mg total) by mouth daily. 60 capsule 3  . ondansetron (ZOFRAN) 4 MG tablet Take 1 tablet (4 mg total) by mouth 2 (two) times daily as needed for nausea or vomiting. 30 tablet 3  . simvastatin (ZOCOR) 20 MG tablet Take 20 mg by mouth at bedtime.      . sucralfate (CARAFATE) 1 G tablet Take 2 tablets (2 g total) by mouth at bedtime. 60 tablet 3  . traMADol (ULTRAM) 50 MG tablet Take 1 tablet (50 mg total) by mouth every 6 (six) hours as needed. (Patient taking differently: Take 50 mg by mouth every 6 (six) hours as needed for moderate pain. ) 30 tablet 0   No current facility-administered medications on file prior to visit.        Objective:   Physical Exam Blood pressure 132/68, pulse 84, temperature 97.4 F (36.3 C), height 5\' 5"  (1.651 m), weight 206 lb 14.4 oz (93.849 kg). Alert and oriented. Skin warm and dry. Oral mucosa is moist.   . Sclera anicteric, conjunctivae is pink. Thyroid not enlarged. No cervical lymphadenopathy. Lungs clear. Heart regular rate and rhythm.  Abdomen is soft. Bowel sounds are positive. No hepatomegaly. No abdominal masses felt. Slight tenderness rt lower quadrant.  No edema to lower extremities.         Assessment & Plan:  Abdominal pain: chronic. Nausea but no vomiting. Hx of large hiatal hernia.  Possible diverticulitis. I discussed with Dr. . Rx for Cipro 500mg  BID and Flagyl 250mg  TID x 10 days. PR Monday. If not better will order a CT abdomen/pelvis with CM.  CBC, CMET and Urinalysis. Patient appears stable for discharge. Vital are stable.  I asked patient to call with a PR Monday. She says if she was alive. I advised her if her symptoms worsened to go to the ED.  Patient also requested an Rx for sleep and I advised her to talk with her PCP.

## 2014-08-08 NOTE — Patient Instructions (Signed)
Cipro 500mg  bid x 10 days Flagyl 250mg  TID x 10 days. PR Monday. If not better will get a CT scan

## 2014-08-09 LAB — URINALYSIS
Bilirubin Urine: NEGATIVE
Glucose, UA: NEGATIVE mg/dL
HGB URINE DIPSTICK: NEGATIVE
Ketones, ur: NEGATIVE mg/dL
NITRITE: NEGATIVE
Protein, ur: NEGATIVE mg/dL
Specific Gravity, Urine: 1.022 (ref 1.005–1.030)
Urobilinogen, UA: 0.2 mg/dL (ref 0.0–1.0)
pH: 6 (ref 5.0–8.0)

## 2014-08-12 ENCOUNTER — Other Ambulatory Visit (INDEPENDENT_AMBULATORY_CARE_PROVIDER_SITE_OTHER): Payer: Self-pay | Admitting: Internal Medicine

## 2014-08-16 DIAGNOSIS — M1712 Unilateral primary osteoarthritis, left knee: Secondary | ICD-10-CM | POA: Diagnosis not present

## 2014-09-25 DIAGNOSIS — M170 Bilateral primary osteoarthritis of knee: Secondary | ICD-10-CM | POA: Diagnosis not present

## 2014-10-02 ENCOUNTER — Other Ambulatory Visit (INDEPENDENT_AMBULATORY_CARE_PROVIDER_SITE_OTHER): Payer: Self-pay | Admitting: Internal Medicine

## 2014-10-14 ENCOUNTER — Other Ambulatory Visit (INDEPENDENT_AMBULATORY_CARE_PROVIDER_SITE_OTHER): Payer: Self-pay | Admitting: Internal Medicine

## 2014-10-21 ENCOUNTER — Other Ambulatory Visit (INDEPENDENT_AMBULATORY_CARE_PROVIDER_SITE_OTHER): Payer: Self-pay | Admitting: Internal Medicine

## 2014-10-22 DIAGNOSIS — R1084 Generalized abdominal pain: Secondary | ICD-10-CM | POA: Diagnosis not present

## 2014-10-22 DIAGNOSIS — K5712 Diverticulitis of small intestine without perforation or abscess without bleeding: Secondary | ICD-10-CM | POA: Diagnosis not present

## 2014-10-22 DIAGNOSIS — K219 Gastro-esophageal reflux disease without esophagitis: Secondary | ICD-10-CM | POA: Diagnosis not present

## 2014-10-23 ENCOUNTER — Other Ambulatory Visit (HOSPITAL_COMMUNITY): Payer: Self-pay | Admitting: Family Medicine

## 2014-10-23 DIAGNOSIS — K5712 Diverticulitis of small intestine without perforation or abscess without bleeding: Secondary | ICD-10-CM

## 2014-10-30 ENCOUNTER — Telehealth (INDEPENDENT_AMBULATORY_CARE_PROVIDER_SITE_OTHER): Payer: Self-pay | Admitting: *Deleted

## 2014-10-30 ENCOUNTER — Ambulatory Visit (HOSPITAL_COMMUNITY): Payer: Medicare Other

## 2014-10-30 ENCOUNTER — Ambulatory Visit (HOSPITAL_COMMUNITY)
Admission: RE | Admit: 2014-10-30 | Discharge: 2014-10-30 | Disposition: A | Discharge status: Home / Self Care | Payer: Medicare Other | Source: Ambulatory Visit | Attending: Family Medicine | Admitting: Family Medicine

## 2014-10-30 DIAGNOSIS — I700 Atherosclerosis of aorta: Secondary | ICD-10-CM | POA: Insufficient documentation

## 2014-10-30 DIAGNOSIS — Z9049 Acquired absence of other specified parts of digestive tract: Secondary | ICD-10-CM | POA: Diagnosis not present

## 2014-10-30 DIAGNOSIS — K449 Diaphragmatic hernia without obstruction or gangrene: Secondary | ICD-10-CM | POA: Insufficient documentation

## 2014-10-30 DIAGNOSIS — K4020 Bilateral inguinal hernia, without obstruction or gangrene, not specified as recurrent: Secondary | ICD-10-CM | POA: Diagnosis not present

## 2014-10-30 DIAGNOSIS — K5712 Diverticulitis of small intestine without perforation or abscess without bleeding: Secondary | ICD-10-CM

## 2014-10-30 DIAGNOSIS — R1031 Right lower quadrant pain: Secondary | ICD-10-CM | POA: Diagnosis present

## 2014-10-30 LAB — POCT I-STAT CREATININE: Creatinine, Ser: 1 mg/dL (ref 0.44–1.00)

## 2014-10-30 MED ORDER — IOHEXOL 300 MG/ML  SOLN
100.0000 mL | Freq: Once | INTRAMUSCULAR | Status: AC | PRN
  Administered 2014-10-30: 100 mL via INTRAVENOUS

## 2014-10-30 NOTE — Telephone Encounter (Signed)
Dr. Dimas Aguas order a CT at [facility] this morning. He and Dara wanted Dr. Karilyn Cota to know so he could look at it. She has an apt on 11/04/14 with Dr. Karilyn Cota.

## 2014-10-30 NOTE — Telephone Encounter (Signed)
CT results reviewed with patient. No evidence of diverticulitis or other acute abnormalities. Patient has office visit with me on 11/04/2014

## 2014-10-30 NOTE — Telephone Encounter (Signed)
Dr. Howard order a CT at [facility] this morning. He and Dara wanted Dr. Rehman to know so he could look at it. She has an apt on 11/04/14 with Dr. Rehman.  

## 2014-11-04 ENCOUNTER — Ambulatory Visit (INDEPENDENT_AMBULATORY_CARE_PROVIDER_SITE_OTHER): Payer: Medicare Other | Admitting: Internal Medicine

## 2014-11-04 ENCOUNTER — Encounter (INDEPENDENT_AMBULATORY_CARE_PROVIDER_SITE_OTHER): Payer: Self-pay | Admitting: Internal Medicine

## 2014-11-04 VITALS — BP 120/78 | HR 72 | Temp 98.7°F | Resp 18 | Ht 65.0 in | Wt 212.0 lb

## 2014-11-04 DIAGNOSIS — R1031 Right lower quadrant pain: Secondary | ICD-10-CM

## 2014-11-04 DIAGNOSIS — K219 Gastro-esophageal reflux disease without esophagitis: Secondary | ICD-10-CM

## 2014-11-04 DIAGNOSIS — F5102 Adjustment insomnia: Secondary | ICD-10-CM

## 2014-11-04 MED ORDER — DIAZEPAM 5 MG PO TABS: 2.5000 mg | ORAL_TABLET | Freq: Four times a day (QID) | ORAL | Status: DC | PRN

## 2014-11-04 NOTE — Patient Instructions (Addendum)
Take third dose of dicyclomine between 8 and 9 PM. If it does not alleviate abdominal pain try Tylenol 500 mg by mouth daily at bedtime. Also try sleeping on the left side and see if it helps. Take diazepam on as-needed basis and not every night.

## 2014-11-04 NOTE — Progress Notes (Signed)
Presenting complaint;  Right low quadrant abdominal pain. History of GERD and nausea.  Subjective:  Dominique Matthews is 81 year old Caucasian female who is here for scheduled visit. She was last seen on 08/08/2014. She has known large hiatal hernia with EGD on 05/17/2014 and she also has history of IBS and sigmoid diverticulitis. Now she presents with few week history of right low quadrant abdominal pain. She saw Dr. Selinda Flavin about 1 week after onset of this pain. He advised increasing dicyclomine to 10 mg 3 times a day. She believes it helped somewhat but pain did not go away completely. He therefore underwent abdominopelvic CT on 10/30/2014. I reviewed Dominique Matthews's CT on 10/30/2014 contacted Dominique Matthews with results. Dominique Matthews is having this pain in middle of night. She's been having this pain every night for about 3 weeks. Pain wakes her up between 1 and 2 AM. She describes this pain as hunger pain but she knows she is not hungry because she eats regular meals. As soon as she sits or stands up the pain goes away. She has never experiences pain during the daytime. Pain is also not triggered with meals or alleviated with bowel movements. She sleeps on her right or left side. She is unable to sleep on her back on account of chronic back pain. She denies fever or chills. Her bowels move every day. She also denies melena or rectal bleeding. She continues to complain of nausea almost every day but it is relieved with Zofran. She is watching her diet and had an of bed is elevated by more than 6 inches. She rarely has regurgitation. She eats yogurt at bedtime as his back which helps. She complains of constant left knee pain and is scheduled to undergo Synvisc injection therapy next Monday. She is taking tramadol for this pain. She says this is source of great distress for her because she is not able to walk and unable to lose weight. She also complains of being anxious and stressed out and has difficulty sleeping and she  wakes up with pain. She does not take OTC NSAIDs.    Current Medications: Outpatient Encounter Prescriptions as of 11/04/2014  Medication Sig  . acetaminophen (TYLENOL) 500 MG tablet Take 500 mg by mouth every 6 (six) hours as needed for moderate pain.  Marland Kitchen aspirin 81 MG tablet Take 81 mg by mouth daily.    . Carboxymethylcellulose Sodium (THERATEARS OP) Place 2 drops into both eyes 6 (six) times daily.  . Cholecalciferol (VITAMIN D-3) 1000 UNITS CAPS Take 1,000 Units by mouth daily.  Marland Kitchen dicyclomine (BENTYL) 10 MG capsule Take 10 mg by mouth 3 (three) times daily.   . fish oil-omega-3 fatty acids 1000 MG capsule Take 1 g by mouth daily.  Marland Kitchen gabapentin (NEURONTIN) 300 MG capsule Take 300 mg by mouth 3 (three) times daily.    Marland Kitchen lisinopril (PRINIVIL,ZESTRIL) 40 MG tablet Take 40 mg by mouth daily.    . meclizine (ANTIVERT) 25 MG tablet Take 25 mg by mouth 3 (three) times daily as needed for dizziness.   . metoprolol (TOPROL-XL) 50 MG 24 hr tablet Take 50 mg by mouth daily.   . Multiple Vitamin (MULTIVITAMIN) tablet Take 1 tablet by mouth daily.    . ondansetron (ZOFRAN) 4 MG tablet TAKE ONE TABLET TWICE DAILY AS NEEDED  . OVER THE COUNTER MEDICATION Thiamine Serene with [employer] - Dominique Matthews states that she takes 2 by mouth daily. Gets at the [employer].  Marland Kitchen PRILOSEC OTC 20 MG tablet TAKE TWO  CAPSULES EACH DAY  . simvastatin (ZOCOR) 20 MG tablet Take 20 mg by mouth at bedtime.    . sucralfate (CARAFATE) 1 G tablet TAKE TWO TABLETS BY MOUTH AT BEDTIME DAILY  . traMADol (ULTRAM) 50 MG tablet Take 1 tablet (50 mg total) by mouth every 6 (six) hours as needed. (Dominique Matthews taking differently: Take 50 mg by mouth every 6 (six) hours as needed for moderate pain. )  . [DISCONTINUED] ciprofloxacin (CIPRO) 500 MG tablet Take 1 tablet (500 mg total) by mouth 2 (two) times daily. (Dominique Matthews not taking: Reported on 11/04/2014)  . [DISCONTINUED] diazepam (VALIUM) 2 MG tablet Take 1 tablet (2 mg total) by mouth every  6 (six) hours as needed for anxiety. (Dominique Matthews not taking: Reported on 11/04/2014)  . [DISCONTINUED] metroNIDAZOLE (FLAGYL) 250 MG tablet Take 1 tablet (250 mg total) by mouth 3 (three) times daily. (Dominique Matthews not taking: Reported on 11/04/2014)   No facility-administered encounter medications on file as of 11/04/2014.     Objective: Blood pressure 120/78, pulse 72, temperature 98.7 F (37.1 C), temperature source Oral, resp. rate 18, height 5\' 5"  (1.651 m), weight 212 lb (96.163 kg). Dominique Matthews is alert and in no acute distress. Conjunctiva is pink. Sclera is nonicteric Oropharyngeal mucosa is normal. No neck masses or thyromegaly noted. Cardiac exam with regular rhythm normal S1 and S2. No murmur or gallop noted. Lungs are clear to auscultation. Abdomen is full. Bowel sounds are normal and no bruit noted. Abdomen is soft and nontender without organomegaly or masses. No inguinal hernia noted on exam in supine position. No LE edema or clubbing noted.  Labs/studies Results: Abdominal pelvic CT from 10/30/2014 reviewed with Dominique Matthews. It shows changes of degenerative disc disease and lumbar spine large hiatal hernia with most of the stomach within the thoracic cavity. Evidence of cholecystectomy atherosclerosis of abdominal aorta without aneurysm. Status post appendicectomy. No wall thickening noted to small or large bowel. Sigmoid colon diverticulosis without diverticulitis and small bilateral fat-containing inguinal hernias    Assessment:  #1. Right low quadrant abdominal pain appears to be of non-GI origin. This pain occurs between 1 and 2 AM and eases when she sits or stands. She has no pain during the daytime. It remains to be seen if changing posture while she is sleeping would help. She may also benefit from taking Tylenol at bedtime. #2. GERD. She has largehiatal hernia. Symptoms are reaonably well controlled with current therapy but she still having intermittent nausea. Given her age she is  being to be high risk for surgical intervention. #3. Insomnia secondary to anxiety and stress. I believe she would benefit from when necessary use of low-dose diazepam which she has taken in the past without untoward effects. She is fully aware of potential side effects including falling episodes.  Plan:  Dominique Matthews will try taking third dose of dicyclomin between 8 and 9 p.m and see if it ameliorates her pain. Tylenol 500 mg by mouth daily at bedtime if dicyclomine does not help in which case she can drop dicyclomine dose back to 10 mg twice a day. Diazepam 2.5 mg by mouth at bedtime as needed. Prescription given for 30 doses along with 2 refills Progress report in 2 weeks. Office visit in 3 months.

## 2014-11-18 DIAGNOSIS — M1712 Unilateral primary osteoarthritis, left knee: Secondary | ICD-10-CM | POA: Diagnosis not present

## 2014-11-20 ENCOUNTER — Other Ambulatory Visit (INDEPENDENT_AMBULATORY_CARE_PROVIDER_SITE_OTHER): Payer: Self-pay | Admitting: Internal Medicine

## 2014-12-10 ENCOUNTER — Other Ambulatory Visit (INDEPENDENT_AMBULATORY_CARE_PROVIDER_SITE_OTHER): Payer: Self-pay | Admitting: Internal Medicine

## 2015-01-01 DIAGNOSIS — K219 Gastro-esophageal reflux disease without esophagitis: Secondary | ICD-10-CM | POA: Diagnosis not present

## 2015-01-01 DIAGNOSIS — R5383 Other fatigue: Secondary | ICD-10-CM | POA: Diagnosis not present

## 2015-01-01 DIAGNOSIS — I10 Essential (primary) hypertension: Secondary | ICD-10-CM | POA: Diagnosis not present

## 2015-01-01 DIAGNOSIS — E559 Vitamin D deficiency, unspecified: Secondary | ICD-10-CM | POA: Diagnosis not present

## 2015-01-01 DIAGNOSIS — E7800 Pure hypercholesterolemia, unspecified: Secondary | ICD-10-CM | POA: Diagnosis not present

## 2015-01-08 DIAGNOSIS — Z13220 Encounter for screening for lipoid disorders: Secondary | ICD-10-CM | POA: Diagnosis not present

## 2015-01-08 DIAGNOSIS — Z23 Encounter for immunization: Secondary | ICD-10-CM | POA: Diagnosis not present

## 2015-01-08 DIAGNOSIS — K219 Gastro-esophageal reflux disease without esophagitis: Secondary | ICD-10-CM | POA: Diagnosis not present

## 2015-01-08 DIAGNOSIS — I10 Essential (primary) hypertension: Secondary | ICD-10-CM | POA: Diagnosis not present

## 2015-01-08 DIAGNOSIS — Z1389 Encounter for screening for other disorder: Secondary | ICD-10-CM | POA: Diagnosis not present

## 2015-01-08 DIAGNOSIS — R5383 Other fatigue: Secondary | ICD-10-CM | POA: Diagnosis not present

## 2015-01-08 DIAGNOSIS — R1084 Generalized abdominal pain: Secondary | ICD-10-CM | POA: Diagnosis not present

## 2015-01-08 DIAGNOSIS — Z0001 Encounter for general adult medical examination with abnormal findings: Secondary | ICD-10-CM | POA: Diagnosis not present

## 2015-01-28 ENCOUNTER — Other Ambulatory Visit (INDEPENDENT_AMBULATORY_CARE_PROVIDER_SITE_OTHER): Payer: Self-pay | Admitting: Internal Medicine

## 2015-02-04 ENCOUNTER — Encounter (INDEPENDENT_AMBULATORY_CARE_PROVIDER_SITE_OTHER): Payer: Self-pay | Admitting: Internal Medicine

## 2015-02-04 ENCOUNTER — Ambulatory Visit (INDEPENDENT_AMBULATORY_CARE_PROVIDER_SITE_OTHER): Payer: Medicare Other | Admitting: Internal Medicine

## 2015-02-04 VITALS — BP 132/72 | HR 64 | Temp 97.6°F | Ht 65.0 in | Wt 211.6 lb

## 2015-02-04 DIAGNOSIS — K5732 Diverticulitis of large intestine without perforation or abscess without bleeding: Secondary | ICD-10-CM

## 2015-02-04 DIAGNOSIS — K589 Irritable bowel syndrome without diarrhea: Secondary | ICD-10-CM

## 2015-02-04 NOTE — Progress Notes (Signed)
Subjective:    Patient ID: Dominique Matthews, female    DOB: 1929/09/01, 81 y.o.   MRN: [id number]  HPI Here today for f/u. She was last seen by Dr. Karilyn Cota in August of this year with rt lower quadrant pain. CT was negative for diverticuliltis. Hx of large hiatal hernia with EGD on 05/17/2014. Hx of IBS and sigmoid diverticulitis. She had blood work drawn at Dr. Penni Bombard last weeks and she reports it was normal.  I will get those results. She sees her Cardiologist in December. She tells me she is doing good for the most part. She occasionally has rt lower quadrant pain. She is taking Dicyclomine 10mg  TID.  She is trying to sleep on her left side.  Appetite has remained good. She is taking Zantac 150mg  BID. GERD is controlled with Zantac.  Her insurance would not pay for Prilosec OTC anymore. She usually has a BM daily. No melena or BRRB.    10/30/2014 CT abdomen/pelvis with CM: abdominal pain: Rt lower quadrant pain.  IMPRESSION: Large hiatal hernia is noted.  No hydronephrosis or renal obstruction is noted. No renal or ureteral calculi are noted.  Atherosclerosis of abdominal aorta is noted without aneurysm formation.  Status post cholecystectomy and appendectomy.  Mild bilateral fat containing inguinal hernias.     05/17/2014 EGD:   Indications: Patient is 81 year old Caucasian female who has known large hiatal hernia who presents with daily nausea lower abdominal pain radiating up and epigastric region as well as postprandial bloating and decrease her appetite. She was recently treated for diverticulitis with resolution of lower abdominal pain but not the symptoms. Impression: No evidence of erosive esophagitis. Large hiatal hernia with organoaxial rotation resulting in difficulty advancing the scope into distal stomach. Proximal stomach for of bile. Gastritis with erosion. Gastric biopsy shows reactive changes but no evidence of H. pylori infection.  05/01/2014 CT  abdomen/pelvis with CM  IMPRESSION: 1. No acute or inflammatory process identified in the abdomen or pelvis. 2. Diverticulosis of the sigmoid colon without active inflammation. 3. Large chronic gastric and mesentery containing hiatal hernia with partially visible intra thoracic stomach.  Review of Systems Past Medical History  Diagnosis Date  . CAD (coronary artery disease)   . History of supraventricular tachycardia   . Palpitations   . Mixed dyslipidemia   . Cerebrovascular disease     Nonobstructive  . Hypertension   . GERD (gastroesophageal reflux disease)   . Chronic benign neutropenia (HCC)   . Chronic anxiety   . Osteoarthritis   . IBS (irritable bowel syndrome)   . Obesity   . Atrial fibrillation (HCC)   . Diverticulitis   . Diverticulitis     Past Surgical History  Procedure Laterality Date  . Appendectomy    . Cholecystectomy    . Carpal tunnel release      Right; Bilateral trigger thumb release surgery  . Knee arthroscopy      Right  . Cardiac stent in 1997      at [facility]  . Coronary angioplasty      stent  . Esophagogastroduodenoscopy N/A 05/17/2014    Procedure: ESOPHAGOGASTRODUODENOSCOPY (EGD);  Surgeon: [facility], MD;  Location: [facility];  Service: Endoscopy;  Laterality: N/A;  730    Allergies  Allergen Reactions  . Codeine     REACTION: sweating  . Morphine     REACTION: sweating    Current Outpatient Prescriptions on File Prior to Visit  Medication Sig Dispense  Refill  . acetaminophen (TYLENOL) 500 MG tablet Take 500 mg by mouth every 6 (six) hours as needed for moderate pain.    Marland Kitchen aspirin 81 MG tablet Take 81 mg by mouth daily.      . Carboxymethylcellulose Sodium (THERATEARS OP) Place 2 drops into both eyes 6 (six) times daily.    . Cholecalciferol (VITAMIN D-3) 1000 UNITS CAPS Take 1,000 Units by mouth daily.    . diazepam (VALIUM) 5 MG tablet Take 0.5 tablets (2.5 mg total) by mouth every 6 (six) hours as needed  for anxiety. 15 tablet 2  . dicyclomine (BENTYL) 10 MG capsule Take 10 mg by mouth 3 (three) times daily.     . fish oil-omega-3 fatty acids 1000 MG capsule Take 1 g by mouth daily.    Marland Kitchen gabapentin (NEURONTIN) 300 MG capsule Take 300 mg by mouth 3 (three) times daily.      Marland Kitchen lisinopril (PRINIVIL,ZESTRIL) 40 MG tablet Take 40 mg by mouth daily.      . meclizine (ANTIVERT) 25 MG tablet Take 25 mg by mouth 3 (three) times daily as needed for dizziness.     . metoprolol (TOPROL-XL) 50 MG 24 hr tablet Take 50 mg by mouth daily.     . Multiple Vitamin (MULTIVITAMIN) tablet Take 1 tablet by mouth daily.      . ondansetron (ZOFRAN) 4 MG tablet TAKE ONE TABLET TWICE DAILY AS NEEDED 30 tablet 1  . OVER THE COUNTER MEDICATION Thiamine Serene with Relora - patient states that she takes 2 by mouth daily. Gets at the [employer].    . simvastatin (ZOCOR) 20 MG tablet Take 20 mg by mouth at bedtime.      . sucralfate (CARAFATE) 1 G tablet TAKE TWO TABLETS BY MOUTH AT BEDTIME DAILY 60 tablet 1  . traMADol (ULTRAM) 50 MG tablet Take 1 tablet (50 mg total) by mouth every 6 (six) hours as needed. (Patient taking differently: Take 50 mg by mouth every 6 (six) hours as needed for moderate pain. ) 30 tablet 0  . PRILOSEC OTC 20 MG tablet TAKE TWO CAPSULES EACH DAY (Patient not taking: Reported on 02/04/2015) 60 tablet 5   No current facility-administered medications on file prior to visit.        Objective:   Physical Exam Blood pressure 132/72, pulse 64, temperature 97.6 F (36.4 C), height 5\' 5"  (1.651 m), weight 211 lb 9.6 oz (95.981 kg). Alert and oriented. Skin warm and dry. Oral mucosa is moist.   . Sclera anicteric, conjunctivae is pink. Thyroid not enlarged. No cervical lymphadenopathy. Lungs clear. Heart regular rate and rhythm.  Abdomen is soft. Bowel sounds are positive. No hepatomegaly. No abdominal masses felt. No tenderness.  No edema to lower extremities.         Assessment & Plan:  Hx  of diverticulitis. She feels better. No complaints.  Hx of IBS. Presently taking Dicyclomine. No abdominal pain. GERD controlled with Zantac.  OV in 6 months.

## 2015-02-04 NOTE — Patient Instructions (Signed)
OV in 6 months. 

## 2015-02-23 DIAGNOSIS — Z7982 Long term (current) use of aspirin: Secondary | ICD-10-CM | POA: Diagnosis not present

## 2015-02-23 DIAGNOSIS — R1030 Lower abdominal pain, unspecified: Secondary | ICD-10-CM | POA: Diagnosis not present

## 2015-02-23 DIAGNOSIS — K589 Irritable bowel syndrome without diarrhea: Secondary | ICD-10-CM | POA: Diagnosis not present

## 2015-02-23 DIAGNOSIS — K5732 Diverticulitis of large intestine without perforation or abscess without bleeding: Secondary | ICD-10-CM | POA: Diagnosis not present

## 2015-02-23 DIAGNOSIS — K5792 Diverticulitis of intestine, part unspecified, without perforation or abscess without bleeding: Secondary | ICD-10-CM | POA: Diagnosis not present

## 2015-02-23 DIAGNOSIS — K219 Gastro-esophageal reflux disease without esophagitis: Secondary | ICD-10-CM | POA: Diagnosis not present

## 2015-02-23 DIAGNOSIS — Z87440 Personal history of urinary (tract) infections: Secondary | ICD-10-CM | POA: Diagnosis not present

## 2015-02-23 DIAGNOSIS — M1990 Unspecified osteoarthritis, unspecified site: Secondary | ICD-10-CM | POA: Diagnosis not present

## 2015-02-23 DIAGNOSIS — Z79899 Other long term (current) drug therapy: Secondary | ICD-10-CM | POA: Diagnosis not present

## 2015-03-04 ENCOUNTER — Ambulatory Visit: Payer: Medicare Other | Admitting: Cardiovascular Disease

## 2015-03-07 ENCOUNTER — Ambulatory Visit (INDEPENDENT_AMBULATORY_CARE_PROVIDER_SITE_OTHER): Payer: Medicare Other | Admitting: Internal Medicine

## 2015-03-07 ENCOUNTER — Encounter (INDEPENDENT_AMBULATORY_CARE_PROVIDER_SITE_OTHER): Payer: Self-pay

## 2015-03-07 ENCOUNTER — Encounter (INDEPENDENT_AMBULATORY_CARE_PROVIDER_SITE_OTHER): Payer: Self-pay | Admitting: Internal Medicine

## 2015-03-07 VITALS — BP 124/82 | HR 64 | Temp 97.8°F | Ht 65.0 in | Wt 209.0 lb

## 2015-03-07 DIAGNOSIS — K5732 Diverticulitis of large intestine without perforation or abscess without bleeding: Secondary | ICD-10-CM | POA: Diagnosis not present

## 2015-03-07 DIAGNOSIS — K5792 Diverticulitis of intestine, part unspecified, without perforation or abscess without bleeding: Secondary | ICD-10-CM | POA: Diagnosis not present

## 2015-03-07 LAB — CBC WITH DIFFERENTIAL/PLATELET
Basophils Absolute: 0.1 10*3/uL (ref 0.0–0.1)
Basophils Relative: 1 % (ref 0–1)
EOS PCT: 3 % (ref 0–5)
Eosinophils Absolute: 0.2 10*3/uL (ref 0.0–0.7)
HEMATOCRIT: 40 % (ref 36.0–46.0)
Hemoglobin: 13.3 g/dL (ref 12.0–15.0)
LYMPHS ABS: 1.2 10*3/uL (ref 0.7–4.0)
LYMPHS PCT: 23 % (ref 12–46)
MCH: 32 pg (ref 26.0–34.0)
MCHC: 33.3 g/dL (ref 30.0–36.0)
MCV: 96.4 fL (ref 78.0–100.0)
MONO ABS: 0.5 10*3/uL (ref 0.1–1.0)
MPV: 10 fL (ref 8.6–12.4)
Monocytes Relative: 9 % (ref 3–12)
Neutro Abs: 3.3 10*3/uL (ref 1.7–7.7)
Neutrophils Relative %: 64 % (ref 43–77)
Platelets: 194 10*3/uL (ref 150–400)
RBC: 4.15 MIL/uL (ref 3.87–5.11)
RDW: 13 % (ref 11.5–15.5)
WBC: 5.2 10*3/uL (ref 4.0–10.5)

## 2015-03-07 NOTE — Patient Instructions (Addendum)
Cbc today.  Further recommendation to follow. Will get CT and Labs from [facility]

## 2015-03-07 NOTE — Progress Notes (Addendum)
Subjective:    Patient ID: Dominique Matthews, female    DOB: 11-26-1929, 81 y.o.   MRN: [id number]  HPI Seen in the ED at [facility] on 02/23/2015. She had abdominal pain. She tells me she underwent a CT which showed she had sigmoid diverticulitis. She says she had a low grade fever. She was treated with Cipro and Flagyl x 10. She tells me she feels weak. She is trying to eat. She has some nausea. She says she is waking up during the night and can't sleep.  She says she still has some residual tenderness in her left lower quadrant. She has had diverticulitis 3-4 times.  Her BMs are soft. There is no diarrhe She has been upset over her niece being in Hospice.   1218/2016: CT abdomen/pelvis with CM: Sigmoid diverticulitis without perforation or abscess formation.  1218/2016  WBC 10.2, H and H 12.8 and 38.3.  10/30/2014 CT abdomen/pelvis with CM: abdominal pain: Rt lower quadrant pain.  IMPRESSION: Large hiatal hernia is noted.  No hydronephrosis or renal obstruction is noted. No renal or ureteral calculi are noted.  Atherosclerosis of abdominal aorta is noted without aneurysm formation.  Status post cholecystectomy and appendectomy.  Mild bilateral fat containing inguinal hernias.     05/17/2014 EGD:   Indications: Patient is 81 year old Caucasian female who has known large hiatal hernia who presents with daily nausea lower abdominal pain radiating up and epigastric region as well as postprandial bloating and decrease her appetite. She was recently treated for diverticulitis with resolution of lower abdominal pain but not the symptoms. Impression: No evidence of erosive esophagitis. Large hiatal hernia with organoaxial rotation resulting in difficulty advancing the scope into distal stomach. Proximal stomach for of bile. Gastritis with erosion. Gastric biopsy shows reactive changes but no evidence of H. pylori infection.  05/01/2014 CT abdomen/pelvis with CM  IMPRESSION: 1. No  acute or inflammatory process identified in the abdomen or pelvis. 2. Diverticulosis of the sigmoid colon without active inflammation. 3. Large chronic gastric and mesentery containing hiatal hernia with partially visible intra thoracic stomach.  01/06/2014 CT abdomen/pelvis with CM IMPRESSION: Near complete resolution of mild distal sigmoid diverticulitis. No evidence of abscess or other acute findings.  Stable large hiatal hernia.   ]Review of Systems Past Medical History  Diagnosis Date  . CAD (coronary artery disease)   . History of supraventricular tachycardia   . Palpitations   . Mixed dyslipidemia   . Cerebrovascular disease     Nonobstructive  . Hypertension   . GERD (gastroesophageal reflux disease)   . Chronic benign neutropenia (HCC)   . Chronic anxiety   . Osteoarthritis   . IBS (irritable bowel syndrome)   . Obesity   . Atrial fibrillation (HCC)   . Diverticulitis   . Diverticulitis     Past Surgical History  Procedure Laterality Date  . Appendectomy    . Cholecystectomy    . Carpal tunnel release      Right; Bilateral trigger thumb release surgery  . Knee arthroscopy      Right  . Cardiac stent in 1997      at [facility]  . Coronary angioplasty      stent  . Esophagogastroduodenoscopy N/A 05/17/2014    Procedure: ESOPHAGOGASTRODUODENOSCOPY (EGD);  Surgeon: Malissa Hippo, MD;  Location: [facility];  Service: Endoscopy;  Laterality: N/A;  730    Allergies  Allergen Reactions  . Codeine     REACTION: sweating  . Morphine  REACTION: sweating    Current Outpatient Prescriptions on File Prior to Visit  Medication Sig Dispense Refill  . acetaminophen (TYLENOL) 500 MG tablet Take 500 mg by mouth every 6 (six) hours as needed for moderate pain.    Marland Kitchen aspirin 81 MG tablet Take 81 mg by mouth daily.      . Carboxymethylcellulose Sodium (THERATEARS OP) Place 2 drops into both eyes 6 (six) times daily.    . Cholecalciferol (VITAMIN D-3)  1000 UNITS CAPS Take 1,000 Units by mouth daily.    . diazepam (VALIUM) 5 MG tablet Take 0.5 tablets (2.5 mg total) by mouth every 6 (six) hours as needed for anxiety. 15 tablet 2  . dicyclomine (BENTYL) 10 MG capsule Take 10 mg by mouth 3 (three) times daily.     . fish oil-omega-3 fatty acids 1000 MG capsule Take 1 g by mouth daily.    Marland Kitchen gabapentin (NEURONTIN) 300 MG capsule Take 300 mg by mouth 3 (three) times daily.      Marland Kitchen lisinopril (PRINIVIL,ZESTRIL) 40 MG tablet Take 40 mg by mouth daily.      . meclizine (ANTIVERT) 25 MG tablet Take 25 mg by mouth 3 (three) times daily as needed for dizziness.     . metoprolol (TOPROL-XL) 50 MG 24 hr tablet Take 50 mg by mouth daily.     . Multiple Vitamin (MULTIVITAMIN) tablet Take 1 tablet by mouth daily.      . ondansetron (ZOFRAN) 4 MG tablet TAKE ONE TABLET TWICE DAILY AS NEEDED 30 tablet 1  . OVER THE COUNTER MEDICATION Thiamine Serene with Relora - patient states that she takes 2 by mouth daily. Gets at the [employer].    Marland Kitchen PRILOSEC OTC 20 MG tablet TAKE TWO CAPSULES EACH DAY 60 tablet 5  . ranitidine (ZANTAC) 150 MG tablet Take 150 mg by mouth 2 (two) times daily.    . simvastatin (ZOCOR) 20 MG tablet Take 20 mg by mouth at bedtime.      . sucralfate (CARAFATE) 1 G tablet TAKE TWO TABLETS BY MOUTH AT BEDTIME DAILY 60 tablet 1  . traMADol (ULTRAM) 50 MG tablet Take 1 tablet (50 mg total) by mouth every 6 (six) hours as needed. (Patient taking differently: Take 50 mg by mouth every 6 (six) hours as needed for moderate pain. ) 30 tablet 0   No current facility-administered medications on file prior to visit.        Objective:   Physical Exam Blood pressure 124/82, pulse 64, temperature 97.8 F (36.6 C), height 5\' 5"  (1.651 m), weight 209 lb (94.802 kg). Alert and oriented. Skin warm and dry. Oral mucosa is moist.   . Sclera anicteric, conjunctivae is pink. Thyroid not enlarged. No cervical lymphadenopathy. Lungs clear. Heart regular rate  and rhythm.  Abdomen is soft. Bowel sounds are positive. No hepatomegaly. No abdominal masses felt. No tenderness.  No edema to lower extremities.          Assessment & Plan:  Diverticulitis. She is better but still has slight pain in LLQ. There has been no fever. Will get a CBC today. Will get CT report and labs from [facility]. Further recommendations to follow.

## 2015-03-13 ENCOUNTER — Telehealth (INDEPENDENT_AMBULATORY_CARE_PROVIDER_SITE_OTHER): Payer: Self-pay | Admitting: Internal Medicine

## 2015-03-13 NOTE — Telephone Encounter (Signed)
Dominique Matthews left a message that she was speaking to you regarding something but gave no specifics. She'd like a phone call back.   Pt's ph# [phone] Thank you.

## 2015-03-13 NOTE — Telephone Encounter (Signed)
Results given to patient. She feel better. She has had a death in her family

## 2015-03-26 ENCOUNTER — Ambulatory Visit (INDEPENDENT_AMBULATORY_CARE_PROVIDER_SITE_OTHER): Payer: Medicare Other | Admitting: Cardiovascular Disease

## 2015-03-26 ENCOUNTER — Encounter: Payer: Self-pay | Admitting: Cardiovascular Disease

## 2015-03-26 VITALS — BP 130/60 | HR 63 | Ht 65.0 in | Wt 212.0 lb

## 2015-03-26 DIAGNOSIS — E785 Hyperlipidemia, unspecified: Secondary | ICD-10-CM

## 2015-03-26 DIAGNOSIS — R002 Palpitations: Secondary | ICD-10-CM

## 2015-03-26 DIAGNOSIS — I10 Essential (primary) hypertension: Secondary | ICD-10-CM

## 2015-03-26 DIAGNOSIS — I2510 Atherosclerotic heart disease of native coronary artery without angina pectoris: Secondary | ICD-10-CM

## 2015-03-26 MED ORDER — NITROGLYCERIN 0.4 MG SL SUBL
0.4000 mg | SUBLINGUAL_TABLET | SUBLINGUAL | Status: DC | PRN
Start: 2015-03-26 — End: 2017-08-08

## 2015-03-26 NOTE — Patient Instructions (Addendum)
   Begin Nitroglycerin as needed for severe chest pain only - new sent to [facility] today.  Continue all current medications. Your physician wants you to follow up in:  1 year.  You will receive a reminder letter in the mail one-two months in advance.  If you don't receive a letter, please call our office to schedule the follow up appointment

## 2015-03-26 NOTE — Progress Notes (Signed)
Patient ID: Dominique Matthews, female   DOB: [birth year], 82 y.o.   MRN: [id number]      SUBJECTIVE: This is a pleasant 82 year old female who presents for an annual followup visit. She has a history of coronary artery disease status post stent placement in 1997. She also has intermittent palpitations controlled with metoprolol.  She seldom has chest pains relieved with Zantac. She attributes this to her hiatal hernia. She very seldom has palpitations. She recently lost a niece to cancer and talked about this.  ECG performed in the office today demonstrates normal sinus rhythm with no ischemic ST segment or T-wave abnormalities, nor any arrhythmias.    Review of Systems: As per "subjective", otherwise negative.  Allergies  Allergen Reactions  . Codeine     REACTION: sweating  . Morphine     REACTION: sweating    Current Outpatient Prescriptions  Medication Sig Dispense Refill  . acetaminophen (TYLENOL) 500 MG tablet Take 500 mg by mouth every 6 (six) hours as needed for moderate pain.    Marland Kitchen aspirin 81 MG tablet Take 81 mg by mouth daily.      . Carboxymethylcellulose Sodium (THERATEARS OP) Place 2 drops into both eyes 6 (six) times daily.    . Cholecalciferol (VITAMIN D-3) 1000 UNITS CAPS Take 1,000 Units by mouth daily.    . diazepam (VALIUM) 5 MG tablet Take 0.5 tablets (2.5 mg total) by mouth every 6 (six) hours as needed for anxiety. 15 tablet 2  . dicyclomine (BENTYL) 10 MG capsule Take 10 mg by mouth 3 (three) times daily.     . fish oil-omega-3 fatty acids 1000 MG capsule Take 1 g by mouth daily.    Marland Kitchen gabapentin (NEURONTIN) 300 MG capsule Take 300 mg by mouth 3 (three) times daily.      Marland Kitchen lisinopril (PRINIVIL,ZESTRIL) 40 MG tablet Take 40 mg by mouth daily.      . meclizine (ANTIVERT) 25 MG tablet Take 25 mg by mouth 3 (three) times daily as needed for dizziness.     . metoprolol (TOPROL-XL) 50 MG 24 hr tablet Take 50 mg by mouth daily.     . Multiple Vitamin (MULTIVITAMIN)  tablet Take 1 tablet by mouth daily.      . ondansetron (ZOFRAN) 4 MG tablet TAKE ONE TABLET TWICE DAILY AS NEEDED 30 tablet 1  . OVER THE COUNTER MEDICATION Thiamine Serene with Relora - patient states that she takes 2 by mouth daily. Gets at the [employer].    . ranitidine (ZANTAC) 150 MG tablet Take 150 mg by mouth 2 (two) times daily.    . simvastatin (ZOCOR) 20 MG tablet Take 20 mg by mouth at bedtime.      . sucralfate (CARAFATE) 1 G tablet TAKE TWO TABLETS BY MOUTH AT BEDTIME DAILY 60 tablet 1  . traMADol (ULTRAM) 50 MG tablet Take 1 tablet (50 mg total) by mouth every 6 (six) hours as needed. (Patient taking differently: Take 50 mg by mouth every 6 (six) hours as needed for moderate pain. ) 30 tablet 0   No current facility-administered medications for this visit.    Past Medical History  Diagnosis Date  . CAD (coronary artery disease)   . History of supraventricular tachycardia   . Palpitations   . Mixed dyslipidemia   . Cerebrovascular disease     Nonobstructive  . Hypertension   . GERD (gastroesophageal reflux disease)   . Chronic benign neutropenia (HCC)   . Chronic anxiety   .  Osteoarthritis   . IBS (irritable bowel syndrome)   . Obesity   . Atrial fibrillation (HCC)   . Diverticulitis   . Diverticulitis     Past Surgical History  Procedure Laterality Date  . Appendectomy    . Cholecystectomy    . Carpal tunnel release      Right; Bilateral trigger thumb release surgery  . Knee arthroscopy      Right  . Cardiac stent in 1997      at [facility]  . Coronary angioplasty      stent  . Esophagogastroduodenoscopy N/A 05/17/2014    Procedure: ESOPHAGOGASTRODUODENOSCOPY (EGD);  Surgeon: Malissa Hippo, MD;  Location: [facility];  Service: Endoscopy;  Laterality: N/A;  730    Social History   Social History  . Marital Status: Married    Spouse Name: N/A  . Number of Children: N/A  . Years of Education: N/A   Occupational History  . Not on  file.   Social History Main Topics  . Smoking status: Never Smoker   . Smokeless tobacco: Never Used  . Alcohol Use: No  . Drug Use: Not on file  . Sexual Activity: Not on file   Other Topics Concern  . Not on file   Social History Narrative     Filed Vitals:   03/26/15 1308  BP: 130/60  Pulse: 63  Height: 5\' 5"  (1.651 m)  Weight: 212 lb (96.163 kg)  SpO2: 96%    PHYSICAL EXAM General: NAD HEENT: Normal. Neck: No JVD, no thyromegaly. Lungs: Clear to auscultation bilaterally with normal respiratory effort. CV: Nondisplaced PMI.  Regular rate and rhythm, normal S1/S2, no S3/S4, no murmur. No pretibial or periankle edema.  No carotid bruit.    Abdomen: Soft, nontender, obese.  Neurologic: Alert and oriented x 3.  Psych: Normal affect. Skin: Normal. Musculoskeletal: No gross deformities. Extremities: No clubbing or cyanosis.   ECG: Most recent ECG reviewed.      ASSESSMENT AND PLAN: 1. CAD: Symptomatically stable. Continue ASA, metoprolol succinate 50 mg daily, and simvastatin. Will prescribe SL nitroglycerin.  2. Hyperlipidemia: Monitored by Dr. (PCP). Continue simvastatin 20 mg daily.  3. Palpitations: Controlled with Toprol XL 50 mg daily. No changes.  4. Essential HTN: Controlled on lisinopril 40 mg daily. No changes.  Dispo: f/u 1 year.   Dimas Aguas, M.D., F.A.C.C.

## 2015-04-01 ENCOUNTER — Other Ambulatory Visit (INDEPENDENT_AMBULATORY_CARE_PROVIDER_SITE_OTHER): Payer: Self-pay | Admitting: Internal Medicine

## 2015-04-11 DIAGNOSIS — L03811 Cellulitis of head [any part, except face]: Secondary | ICD-10-CM | POA: Diagnosis not present

## 2015-05-02 ENCOUNTER — Other Ambulatory Visit (INDEPENDENT_AMBULATORY_CARE_PROVIDER_SITE_OTHER): Payer: Self-pay | Admitting: Internal Medicine

## 2015-06-06 DIAGNOSIS — R1032 Left lower quadrant pain: Secondary | ICD-10-CM | POA: Diagnosis not present

## 2015-06-06 DIAGNOSIS — R350 Frequency of micturition: Secondary | ICD-10-CM | POA: Diagnosis not present

## 2015-06-09 DIAGNOSIS — R1032 Left lower quadrant pain: Secondary | ICD-10-CM | POA: Diagnosis not present

## 2015-06-09 DIAGNOSIS — M1990 Unspecified osteoarthritis, unspecified site: Secondary | ICD-10-CM | POA: Diagnosis not present

## 2015-06-09 DIAGNOSIS — I2510 Atherosclerotic heart disease of native coronary artery without angina pectoris: Secondary | ICD-10-CM | POA: Diagnosis not present

## 2015-06-09 DIAGNOSIS — K219 Gastro-esophageal reflux disease without esophagitis: Secondary | ICD-10-CM | POA: Diagnosis not present

## 2015-06-09 DIAGNOSIS — F419 Anxiety disorder, unspecified: Secondary | ICD-10-CM | POA: Diagnosis not present

## 2015-06-09 DIAGNOSIS — K5730 Diverticulosis of large intestine without perforation or abscess without bleeding: Secondary | ICD-10-CM | POA: Diagnosis not present

## 2015-06-09 DIAGNOSIS — K5792 Diverticulitis of intestine, part unspecified, without perforation or abscess without bleeding: Secondary | ICD-10-CM | POA: Diagnosis not present

## 2015-06-09 DIAGNOSIS — K5732 Diverticulitis of large intestine without perforation or abscess without bleeding: Secondary | ICD-10-CM | POA: Diagnosis not present

## 2015-06-09 DIAGNOSIS — K5733 Diverticulitis of large intestine without perforation or abscess with bleeding: Secondary | ICD-10-CM | POA: Diagnosis not present

## 2015-06-09 DIAGNOSIS — E785 Hyperlipidemia, unspecified: Secondary | ICD-10-CM | POA: Diagnosis not present

## 2015-06-10 DIAGNOSIS — Z79899 Other long term (current) drug therapy: Secondary | ICD-10-CM | POA: Diagnosis not present

## 2015-06-10 DIAGNOSIS — K449 Diaphragmatic hernia without obstruction or gangrene: Secondary | ICD-10-CM | POA: Diagnosis present

## 2015-06-10 DIAGNOSIS — K219 Gastro-esophageal reflux disease without esophagitis: Secondary | ICD-10-CM | POA: Diagnosis present

## 2015-06-10 DIAGNOSIS — Z96651 Presence of right artificial knee joint: Secondary | ICD-10-CM | POA: Diagnosis present

## 2015-06-10 DIAGNOSIS — F419 Anxiety disorder, unspecified: Secondary | ICD-10-CM | POA: Diagnosis present

## 2015-06-10 DIAGNOSIS — M1990 Unspecified osteoarthritis, unspecified site: Secondary | ICD-10-CM | POA: Diagnosis present

## 2015-06-10 DIAGNOSIS — Z7982 Long term (current) use of aspirin: Secondary | ICD-10-CM | POA: Diagnosis not present

## 2015-06-10 DIAGNOSIS — Z886 Allergy status to analgesic agent status: Secondary | ICD-10-CM | POA: Diagnosis not present

## 2015-06-10 DIAGNOSIS — K5792 Diverticulitis of intestine, part unspecified, without perforation or abscess without bleeding: Secondary | ICD-10-CM | POA: Diagnosis not present

## 2015-06-10 DIAGNOSIS — F329 Major depressive disorder, single episode, unspecified: Secondary | ICD-10-CM | POA: Diagnosis present

## 2015-06-10 DIAGNOSIS — E785 Hyperlipidemia, unspecified: Secondary | ICD-10-CM | POA: Diagnosis present

## 2015-06-10 DIAGNOSIS — I2510 Atherosclerotic heart disease of native coronary artery without angina pectoris: Secondary | ICD-10-CM | POA: Diagnosis not present

## 2015-06-10 DIAGNOSIS — K5732 Diverticulitis of large intestine without perforation or abscess without bleeding: Secondary | ICD-10-CM | POA: Diagnosis not present

## 2015-06-23 ENCOUNTER — Ambulatory Visit (INDEPENDENT_AMBULATORY_CARE_PROVIDER_SITE_OTHER): Payer: Medicare Other | Admitting: Internal Medicine

## 2015-06-23 ENCOUNTER — Encounter (INDEPENDENT_AMBULATORY_CARE_PROVIDER_SITE_OTHER): Payer: Self-pay | Admitting: Internal Medicine

## 2015-06-23 VITALS — BP 136/100 | HR 60 | Temp 98.0°F | Ht 63.0 in | Wt 206.2 lb

## 2015-06-23 DIAGNOSIS — I2510 Atherosclerotic heart disease of native coronary artery without angina pectoris: Secondary | ICD-10-CM | POA: Diagnosis not present

## 2015-06-23 DIAGNOSIS — K5732 Diverticulitis of large intestine without perforation or abscess without bleeding: Secondary | ICD-10-CM

## 2015-06-23 NOTE — Patient Instructions (Signed)
Diverticulosis diet. OV in 6 months.

## 2015-06-23 NOTE — Progress Notes (Addendum)
Subjective:    Patient ID: Dominique Matthews, female    DOB: 06-Aug-1929, 82 y.o.   MRN: [id number]  HPI Here today for f/u after recent admission to [facility] for diverticulitis 06/13/2015. She was admitted x 5 days. She says when she was admitted her stools was black. Her stools are brown now.  Treated with Cipro and Flagyl.   Hx of sigmoid diverticulitis in the past.  She has had multiple episodes of diverticulitis.  She tells me she feels better  She says she is weak. She is requesting a diet for diverticulitis.  Her appetite is good. No weight loss. She is having a BM daily.    CBC    Component Value Date/Time   WBC 5.2 03/07/2015 0938   RBC 4.15 03/07/2015 0938   HGB 13.3 03/07/2015 0938   HCT 40.0 03/07/2015 0938   PLT 194 03/07/2015 0938   MCV 96.4 03/07/2015 0938   MCH 32.0 03/07/2015 0938   MCHC 33.3 03/07/2015 0938   RDW 13.0 03/07/2015 0938   LYMPHSABS 1.2 03/07/2015 0938   MONOABS 0.5 03/07/2015 0938   EOSABS 0.2 03/07/2015 0938   BASOSABS 0.1 03/07/2015 0938       1218/2016: CT abdomen/pelvis with CM: Sigmoid diverticulitis without perforation or abscess formation.  1218/2016 WBC 10.2, H and H 12.8 and 38.3.  10/30/2014 CT abdomen/pelvis with CM: abdominal pain: Rt lower quadrant pain.  IMPRESSION: Large hiatal hernia is noted.  No hydronephrosis or renal obstruction is noted. No renal or ureteral calculi are noted.  Atherosclerosis of abdominal aorta is noted without aneurysm formation.  Status post cholecystectomy and appendectomy.  Mild bilateral fat containing inguinal hernias.    05/17/2014 EGD:   Indications: Patient is 82 year old Caucasian female who has known large hiatal hernia who presents with daily nausea lower abdominal pain radiating up and epigastric region as well as postprandial bloating and decrease her appetite. She was recently treated for diverticulitis with resolution of lower abdominal pain but not the symptoms. Impression: No  evidence of erosive esophagitis. Large hiatal hernia with organoaxial rotation resulting in difficulty advancing the scope into distal stomach. Proximal stomach for of bile. Gastritis with erosion. Gastric biopsy shows reactive changes but no evidence of H. pylori infection.  05/01/2014 CT abdomen/pelvis with CM  IMPRESSION: 1. No acute or inflammatory process identified in the abdomen or pelvis. 2. Diverticulosis of the sigmoid colon without active inflammation. 3. Large chronic gastric and mesentery containing hiatal hernia with partially visible intra thoracic stomach.  01/06/2014 CT abdomen/pelvis with CM IMPRESSION: Near complete resolution of mild distal sigmoid diverticulitis. No evidence of abscess or other acute findings.  Stable large hiatal hernia.    Review of Systems Past Medical History  Diagnosis Date  . CAD (coronary artery disease)   . History of supraventricular tachycardia   . Palpitations   . Mixed dyslipidemia   . Cerebrovascular disease     Nonobstructive  . Hypertension   . GERD (gastroesophageal reflux disease)   . Chronic benign neutropenia (HCC)   . Chronic anxiety   . Osteoarthritis   . IBS (irritable bowel syndrome)   . Obesity   . Atrial fibrillation (HCC)   . Diverticulitis   . Diverticulitis     Past Surgical History  Procedure Laterality Date  . Appendectomy    . Cholecystectomy    . Carpal tunnel release      Right; Bilateral trigger thumb release surgery  . Knee arthroscopy      Right  .  Cardiac stent in 1997      at [facility]  . Coronary angioplasty      stent  . Esophagogastroduodenoscopy N/A 05/17/2014    Procedure: ESOPHAGOGASTRODUODENOSCOPY (EGD);  Surgeon: Malissa Hippo, MD;  Location: [facility];  Service: Endoscopy;  Laterality: N/A;  730    Allergies  Allergen Reactions  . Codeine     REACTION: sweating  . Morphine     REACTION: sweating    Current Outpatient Prescriptions on File Prior to Visit   Medication Sig Dispense Refill  . acetaminophen (TYLENOL) 500 MG tablet Take 500 mg by mouth every 6 (six) hours as needed for moderate pain.    Marland Kitchen aspirin 81 MG tablet Take 81 mg by mouth daily.      . Carboxymethylcellulose Sodium (THERATEARS OP) Place 2 drops into both eyes 6 (six) times daily.    . Cholecalciferol (VITAMIN D-3) 1000 UNITS CAPS Take 1,000 Units by mouth daily.    . diazepam (VALIUM) 5 MG tablet TAKE 1/2 TABLET EVERY 6 HOURS AS NEEDED FOR ANXIETY 15 tablet 2  . dicyclomine (BENTYL) 10 MG capsule Take 10 mg by mouth 3 (three) times daily.     . fish oil-omega-3 fatty acids 1000 MG capsule Take 1 g by mouth daily.    Marland Kitchen gabapentin (NEURONTIN) 300 MG capsule Take 300 mg by mouth 3 (three) times daily.      Marland Kitchen lisinopril (PRINIVIL,ZESTRIL) 40 MG tablet Take 40 mg by mouth daily.      . meclizine (ANTIVERT) 25 MG tablet Take 25 mg by mouth 3 (three) times daily as needed for dizziness.     . metoprolol (TOPROL-XL) 50 MG 24 hr tablet Take 50 mg by mouth daily.     . Multiple Vitamin (MULTIVITAMIN) tablet Take 1 tablet by mouth daily.      . nitroGLYCERIN (NITROSTAT) 0.4 MG SL tablet Place 1 tablet (0.4 mg total) under the tongue every 5 (five) minutes as needed for chest pain. 25 tablet 3  . ondansetron (ZOFRAN) 4 MG tablet TAKE ONE TABLET TWICE DAILY AS NEEDED 30 tablet 1  . OVER THE COUNTER MEDICATION Thiamine Serene with Relora - patient states that she takes 2 by mouth daily. Gets at the [employer].    . ranitidine (ZANTAC) 150 MG tablet Take 150 mg by mouth 2 (two) times daily.    . simvastatin (ZOCOR) 20 MG tablet Take 20 mg by mouth at bedtime.      . sucralfate (CARAFATE) 1 g tablet TAKE TWO TABLETS BY MOUTH AT BEDTIME DAILY 60 tablet 3  . traMADol (ULTRAM) 50 MG tablet Take 1 tablet (50 mg total) by mouth every 6 (six) hours as needed. (Patient taking differently: Take 50 mg by mouth every 6 (six) hours as needed for moderate pain. ) 30 tablet 0   No current  facility-administered medications on file prior to visit.        Objective:   Physical Exam Blood pressure 136/100, pulse 60, temperature 98 F (36.7 C), height 5\' 3"  (1.6 m), weight 206 lb 3.2 oz (93.532 kg). Alert and oriented. Skin warm and dry. Oral mucosa is moist.   . Sclera anicteric, conjunctivae is pink. Thyroid not enlarged. No cervical lymphadenopathy. Lungs clear. Heart regular rate and rhythm.  Abdomen is soft. Bowel sounds are positive. No hepatomegaly. No abdominal masses felt. No tenderness.  No edema to lower extremities.          Assessment & Plan:  Diverticulitis. She is better.   Diverticular diet given to patient.  Next episode she will be referred to [facility] for possible surgery.

## 2015-07-13 DIAGNOSIS — G6289 Other specified polyneuropathies: Secondary | ICD-10-CM | POA: Diagnosis not present

## 2015-07-13 DIAGNOSIS — K5712 Diverticulitis of small intestine without perforation or abscess without bleeding: Secondary | ICD-10-CM | POA: Diagnosis not present

## 2015-07-13 DIAGNOSIS — K219 Gastro-esophageal reflux disease without esophagitis: Secondary | ICD-10-CM | POA: Diagnosis not present

## 2015-07-13 DIAGNOSIS — M1990 Unspecified osteoarthritis, unspecified site: Secondary | ICD-10-CM | POA: Diagnosis not present

## 2015-07-13 DIAGNOSIS — I10 Essential (primary) hypertension: Secondary | ICD-10-CM | POA: Diagnosis not present

## 2015-08-05 ENCOUNTER — Ambulatory Visit (INDEPENDENT_AMBULATORY_CARE_PROVIDER_SITE_OTHER): Payer: Medicare Other | Admitting: Internal Medicine

## 2015-08-18 DIAGNOSIS — M545 Low back pain: Secondary | ICD-10-CM | POA: Diagnosis not present

## 2015-08-18 DIAGNOSIS — I10 Essential (primary) hypertension: Secondary | ICD-10-CM | POA: Diagnosis not present

## 2015-08-18 DIAGNOSIS — G6289 Other specified polyneuropathies: Secondary | ICD-10-CM | POA: Diagnosis not present

## 2015-08-18 DIAGNOSIS — R1084 Generalized abdominal pain: Secondary | ICD-10-CM | POA: Diagnosis not present

## 2015-08-19 DIAGNOSIS — I700 Atherosclerosis of aorta: Secondary | ICD-10-CM | POA: Diagnosis not present

## 2015-08-19 DIAGNOSIS — M25552 Pain in left hip: Secondary | ICD-10-CM | POA: Diagnosis not present

## 2015-08-19 DIAGNOSIS — K5712 Diverticulitis of small intestine without perforation or abscess without bleeding: Secondary | ICD-10-CM | POA: Diagnosis not present

## 2015-08-19 DIAGNOSIS — M47816 Spondylosis without myelopathy or radiculopathy, lumbar region: Secondary | ICD-10-CM | POA: Diagnosis not present

## 2015-08-20 DIAGNOSIS — K219 Gastro-esophageal reflux disease without esophagitis: Secondary | ICD-10-CM | POA: Diagnosis present

## 2015-08-20 DIAGNOSIS — E785 Hyperlipidemia, unspecified: Secondary | ICD-10-CM | POA: Diagnosis present

## 2015-08-20 DIAGNOSIS — K449 Diaphragmatic hernia without obstruction or gangrene: Secondary | ICD-10-CM | POA: Diagnosis present

## 2015-08-20 DIAGNOSIS — F332 Major depressive disorder, recurrent severe without psychotic features: Secondary | ICD-10-CM | POA: Diagnosis present

## 2015-08-20 DIAGNOSIS — M19042 Primary osteoarthritis, left hand: Secondary | ICD-10-CM | POA: Diagnosis present

## 2015-08-20 DIAGNOSIS — M545 Low back pain: Secondary | ICD-10-CM | POA: Diagnosis present

## 2015-08-20 DIAGNOSIS — Z886 Allergy status to analgesic agent status: Secondary | ICD-10-CM | POA: Diagnosis not present

## 2015-08-20 DIAGNOSIS — I2510 Atherosclerotic heart disease of native coronary artery without angina pectoris: Secondary | ICD-10-CM | POA: Diagnosis present

## 2015-08-20 DIAGNOSIS — Z7982 Long term (current) use of aspirin: Secondary | ICD-10-CM | POA: Diagnosis not present

## 2015-08-20 DIAGNOSIS — M19041 Primary osteoarthritis, right hand: Secondary | ICD-10-CM | POA: Diagnosis present

## 2015-08-20 DIAGNOSIS — G8929 Other chronic pain: Secondary | ICD-10-CM | POA: Diagnosis present

## 2015-08-20 DIAGNOSIS — K5732 Diverticulitis of large intestine without perforation or abscess without bleeding: Secondary | ICD-10-CM | POA: Diagnosis not present

## 2015-08-20 DIAGNOSIS — M47896 Other spondylosis, lumbar region: Secondary | ICD-10-CM | POA: Diagnosis present

## 2015-08-20 DIAGNOSIS — R109 Unspecified abdominal pain: Secondary | ICD-10-CM | POA: Diagnosis not present

## 2015-08-20 DIAGNOSIS — F411 Generalized anxiety disorder: Secondary | ICD-10-CM | POA: Diagnosis present

## 2015-08-20 DIAGNOSIS — Z79899 Other long term (current) drug therapy: Secondary | ICD-10-CM | POA: Diagnosis not present

## 2015-08-20 DIAGNOSIS — R6889 Other general symptoms and signs: Secondary | ICD-10-CM | POA: Diagnosis present

## 2015-08-20 DIAGNOSIS — K5792 Diverticulitis of intestine, part unspecified, without perforation or abscess without bleeding: Secondary | ICD-10-CM | POA: Diagnosis not present

## 2015-08-20 DIAGNOSIS — K5720 Diverticulitis of large intestine with perforation and abscess without bleeding: Secondary | ICD-10-CM | POA: Diagnosis not present

## 2015-08-29 ENCOUNTER — Emergency Department (HOSPITAL_COMMUNITY)
Admission: EM | Admit: 2015-08-29 | Discharge: 2015-08-29 | Disposition: A | Discharge status: Home / Self Care | Payer: Medicare Other | Attending: Emergency Medicine | Admitting: Emergency Medicine

## 2015-08-29 ENCOUNTER — Emergency Department (HOSPITAL_COMMUNITY): Payer: Medicare Other

## 2015-08-29 ENCOUNTER — Encounter (HOSPITAL_COMMUNITY): Payer: Self-pay | Admitting: Emergency Medicine

## 2015-08-29 DIAGNOSIS — I2510 Atherosclerotic heart disease of native coronary artery without angina pectoris: Secondary | ICD-10-CM | POA: Diagnosis not present

## 2015-08-29 DIAGNOSIS — Z7982 Long term (current) use of aspirin: Secondary | ICD-10-CM | POA: Insufficient documentation

## 2015-08-29 DIAGNOSIS — R1032 Left lower quadrant pain: Secondary | ICD-10-CM | POA: Diagnosis not present

## 2015-08-29 DIAGNOSIS — M1990 Unspecified osteoarthritis, unspecified site: Secondary | ICD-10-CM | POA: Diagnosis not present

## 2015-08-29 DIAGNOSIS — M5489 Other dorsalgia: Secondary | ICD-10-CM | POA: Diagnosis not present

## 2015-08-29 DIAGNOSIS — I10 Essential (primary) hypertension: Secondary | ICD-10-CM | POA: Diagnosis not present

## 2015-08-29 DIAGNOSIS — K449 Diaphragmatic hernia without obstruction or gangrene: Secondary | ICD-10-CM | POA: Diagnosis not present

## 2015-08-29 DIAGNOSIS — I4891 Unspecified atrial fibrillation: Secondary | ICD-10-CM | POA: Diagnosis not present

## 2015-08-29 DIAGNOSIS — R109 Unspecified abdominal pain: Secondary | ICD-10-CM

## 2015-08-29 DIAGNOSIS — Z79899 Other long term (current) drug therapy: Secondary | ICD-10-CM | POA: Insufficient documentation

## 2015-08-29 LAB — URINALYSIS, ROUTINE W REFLEX MICROSCOPIC
Bilirubin Urine: NEGATIVE
GLUCOSE, UA: NEGATIVE mg/dL
Hgb urine dipstick: NEGATIVE
Ketones, ur: NEGATIVE mg/dL
LEUKOCYTES UA: NEGATIVE
NITRITE: NEGATIVE
PH: 6.5 (ref 5.0–8.0)
Protein, ur: NEGATIVE mg/dL
SPECIFIC GRAVITY, URINE: 1.01 (ref 1.005–1.030)

## 2015-08-29 LAB — COMPREHENSIVE METABOLIC PANEL
ALT: 21 U/L (ref 14–54)
AST: 30 U/L (ref 15–41)
Albumin: 3.9 g/dL (ref 3.5–5.0)
Alkaline Phosphatase: 67 U/L (ref 38–126)
Anion gap: 8 (ref 5–15)
BUN: 9 mg/dL (ref 6–20)
CALCIUM: 9 mg/dL (ref 8.9–10.3)
CHLORIDE: 102 mmol/L (ref 101–111)
CO2: 27 mmol/L (ref 22–32)
CREATININE: 0.85 mg/dL (ref 0.44–1.00)
Glucose, Bld: 121 mg/dL — ABNORMAL HIGH (ref 65–99)
POTASSIUM: 3.6 mmol/L (ref 3.5–5.1)
SODIUM: 137 mmol/L (ref 135–145)
Total Bilirubin: 0.8 mg/dL (ref 0.3–1.2)
Total Protein: 7 g/dL (ref 6.5–8.1)

## 2015-08-29 LAB — LACTIC ACID, PLASMA
LACTIC ACID, VENOUS: 1.4 mmol/L (ref 0.5–2.0)
Lactic Acid, Venous: 1.5 mmol/L (ref 0.5–2.0)

## 2015-08-29 LAB — CBC WITH DIFFERENTIAL/PLATELET
BASOS ABS: 0 10*3/uL (ref 0.0–0.1)
BASOS PCT: 0 %
Eosinophils Absolute: 0 10*3/uL (ref 0.0–0.7)
Eosinophils Relative: 1 %
HEMATOCRIT: 43.3 % (ref 36.0–46.0)
HEMOGLOBIN: 14.9 g/dL (ref 12.0–15.0)
LYMPHS PCT: 10 %
Lymphs Abs: 0.8 10*3/uL (ref 0.7–4.0)
MCH: 33.4 pg (ref 26.0–34.0)
MCHC: 34.4 g/dL (ref 30.0–36.0)
MCV: 97.1 fL (ref 78.0–100.0)
MONOS PCT: 10 %
Monocytes Absolute: 0.8 10*3/uL (ref 0.1–1.0)
NEUTROS ABS: 6.1 10*3/uL (ref 1.7–7.7)
NEUTROS PCT: 79 %
Platelets: 186 10*3/uL (ref 150–400)
RBC: 4.46 MIL/uL (ref 3.87–5.11)
RDW: 12.6 % (ref 11.5–15.5)
WBC: 7.7 10*3/uL (ref 4.0–10.5)

## 2015-08-29 LAB — LIPASE, BLOOD: Lipase: 35 U/L (ref 11–51)

## 2015-08-29 MED ORDER — DIATRIZOATE MEGLUMINE & SODIUM 66-10 % PO SOLN
ORAL | Status: AC
  Administered 2015-08-29: 30 mL
  Filled 2015-08-29: qty 30

## 2015-08-29 MED ORDER — SODIUM CHLORIDE 0.9 % IV SOLN
INTRAVENOUS | Status: DC
  Administered 2015-08-29: 11:00:00 via INTRAVENOUS

## 2015-08-29 MED ORDER — ONDANSETRON HCL 4 MG/2ML IJ SOLN
4.0000 mg | INTRAMUSCULAR | Status: DC | PRN
  Administered 2015-08-29: 4 mg via INTRAVENOUS
  Filled 2015-08-29: qty 2

## 2015-08-29 MED ORDER — MORPHINE SULFATE (PF) 4 MG/ML IV SOLN
4.0000 mg | INTRAVENOUS | Status: AC | PRN
  Administered 2015-08-29 (×2): 4 mg via INTRAVENOUS
  Filled 2015-08-29 (×2): qty 1

## 2015-08-29 MED ORDER — DICYCLOMINE HCL 20 MG PO TABS: 20.0000 mg | ORAL_TABLET | Freq: Four times a day (QID) | ORAL | Status: DC | PRN

## 2015-08-29 MED ORDER — IOPAMIDOL (ISOVUE-300) INJECTION 61%
100.0000 mL | Freq: Once | INTRAVENOUS | Status: AC | PRN
  Administered 2015-08-29: 100 mL via INTRAVENOUS

## 2015-08-29 MED ORDER — TRAMADOL HCL 50 MG PO TABS: 50.0000 mg | ORAL_TABLET | Freq: Four times a day (QID) | ORAL | Status: DC | PRN

## 2015-08-29 NOTE — ED Notes (Signed)
EDP at bedside updating patient and family. 

## 2015-08-29 NOTE — ED Provider Notes (Signed)
CSN: [id number]     Arrival date & time 08/29/15  1028 History   First MD Initiated Contact with Patient 08/29/15 1033     Chief Complaint  Patient presents with  . Abdominal Pain     HPI Pt was seen at 1030.   Per pt, c/o gradual onset and worsening of persistent LLQ abd "pain" for the past 2 weeks.  Has been associated with nausea. Describes the abd pain as radiating into her left lower back. Pt was evaluated at [facility] ED 2 weeks ago for same, dx diverticulitis, rx abx. Pt states she has been taking the antibiotics without improvement. Denies vomiting/diarrhea, no fevers, no back pain, no rash, no CP/SOB, no black or blood in stools.       Past Medical History  Diagnosis Date  . CAD (coronary artery disease)   . History of supraventricular tachycardia   . Palpitations   . Mixed dyslipidemia   . Cerebrovascular disease     Nonobstructive  . Hypertension   . GERD (gastroesophageal reflux disease)   . Chronic benign neutropenia (HCC)   . Chronic anxiety   . Osteoarthritis   . IBS (irritable bowel syndrome)   . Obesity   . Atrial fibrillation (HCC)   . Diverticulitis   . Diverticulitis    Past Surgical History  Procedure Laterality Date  . Appendectomy    . Cholecystectomy    . Carpal tunnel release      Right; Bilateral trigger thumb release surgery  . Knee arthroscopy      Right  . Cardiac stent in 1997      at [facility]  . Coronary angioplasty      stent  . Esophagogastroduodenoscopy N/A 05/17/2014    Procedure: ESOPHAGOGASTRODUODENOSCOPY (EGD);  Surgeon: Malissa Hippo, MD;  Location: [facility];  Service: Endoscopy;  Laterality: N/A;  730   Family History  Problem Relation Age of Onset  . Coronary artery disease Other    Social History  Substance Use Topics  . Smoking status: Never Smoker   . Smokeless tobacco: Never Used  . Alcohol Use: No    Review of Systems ROS: Statement: All systems negative except as marked or noted in the HPI;  Constitutional: Negative for fever and chills. ; ; Eyes: Negative for eye pain, redness and discharge. ; ; ENMT: Negative for ear pain, hoarseness, nasal congestion, sinus pressure and sore throat. ; ; Cardiovascular: Negative for chest pain, palpitations, diaphoresis, dyspnea and peripheral edema. ; ; Respiratory: Negative for cough, wheezing and stridor. ; ; Gastrointestinal: +abd pain, nausea. Negative for vomiting, diarrhea, blood in stool, hematemesis, jaundice and rectal bleeding. . ; ; Genitourinary: Negative for dysuria, flank pain and hematuria. ; ; Musculoskeletal: Negative for back pain and neck pain. Negative for swelling and trauma.; ; Skin: Negative for pruritus, rash, abrasions, blisters, bruising and skin lesion.; ; Neuro: Negative for headache, lightheadedness and neck stiffness. Negative for weakness, altered level of consciousness, altered mental status, extremity weakness, paresthesias, involuntary movement, seizure and syncope.     Allergies  Codeine and Morphine  Home Medications   Prior to Admission medications   Medication Sig Start Date End Date Taking? Authorizing Provider  acetaminophen (TYLENOL) 500 MG tablet Take 500 mg by mouth every 6 (six) hours as needed for moderate pain.    Historical Provider, MD  aspirin 81 MG tablet Take 81 mg by mouth daily.      Historical Provider, MD  Carboxymethylcellulose Sodium (THERATEARS OP) Place  2 drops into both eyes 6 (six) times daily.    Historical Provider, MD  Cholecalciferol (VITAMIN D-3) 1000 UNITS CAPS Take 1,000 Units by mouth daily.    Historical Provider, MD  diazepam (VALIUM) 5 MG tablet TAKE 1/2 TABLET EVERY 6 HOURS AS NEEDED FOR ANXIETY 05/05/15   Malissa Hippo, MD  dicyclomine (BENTYL) 10 MG capsule Take 10 mg by mouth 3 (three) times daily.     Historical Provider, MD  fish oil-omega-3 fatty acids 1000 MG capsule Take 1 g by mouth daily.    Historical Provider, MD  gabapentin (NEURONTIN) 300 MG capsule Take 300 mg by  mouth 3 (three) times daily.      Historical Provider, MD  lisinopril (PRINIVIL,ZESTRIL) 40 MG tablet Take 40 mg by mouth daily.      Historical Provider, MD  meclizine (ANTIVERT) 25 MG tablet Take 25 mg by mouth 3 (three) times daily as needed for dizziness.     Historical Provider, MD  metoprolol (TOPROL-XL) 50 MG 24 hr tablet Take 50 mg by mouth daily.     Historical Provider, MD  Multiple Vitamin (MULTIVITAMIN) tablet Take 1 tablet by mouth daily.      Historical Provider, MD  nitroGLYCERIN (NITROSTAT) 0.4 MG SL tablet Place 1 tablet (0.4 mg total) under the tongue every 5 (five) minutes as needed for chest pain. 03/26/15   Laqueta Linden, MD  ondansetron (ZOFRAN) 4 MG tablet TAKE ONE TABLET TWICE DAILY AS NEEDED 12/11/14   Malissa Hippo, MD  OVER THE COUNTER MEDICATION Thiamine Serene with Relora - patient states that she takes 2 by mouth daily. Gets at the [employer].    Historical Provider, MD  ranitidine (ZANTAC) 150 MG tablet Take 150 mg by mouth 2 (two) times daily.    Historical Provider, MD  simvastatin (ZOCOR) 20 MG tablet Take 20 mg by mouth at bedtime.      Historical Provider, MD  sucralfate (CARAFATE) 1 g tablet TAKE TWO TABLETS BY MOUTH AT BEDTIME DAILY 04/01/15   Len Blalock, NP  traMADol (ULTRAM) 50 MG tablet Take 1 tablet (50 mg total) by mouth every 6 (six) hours as needed. Patient taking differently: Take 50 mg by mouth every 6 (six) hours as needed for moderate pain.  01/14/14   Terri L Setzer, NP   BP 134/87 mmHg  Pulse 95  Temp(Src) 98.4 F (36.9 C) (Oral)  Resp 18  Ht 5\' 6"  (1.676 m)  Wt 200 lb (90.719 kg)  BMI 32.30 kg/m2  SpO2 95% Physical Exam  1035: Physical examination:  Nursing notes reviewed; Vital signs and O2 SAT reviewed;  Constitutional: Well developed, Well nourished, Well hydrated, In no acute distress; Head:  Normocephalic, atraumatic; Eyes: EOMI, PERRL, No scleral icterus; ENMT: Mouth and pharynx normal, Mucous membranes moist; Neck:  Supple, Full range of motion, No lymphadenopathy; Cardiovascular: Regular rate and rhythm, No gallop; Respiratory: Breath sounds clear & equal bilaterally, No wheezes.  Speaking full sentences with ease, Normal respiratory effort/excursion; Chest: Nontender, Movement normal; Abdomen: Soft, +LLQ tender to palp. No rebound or guarding. Nondistended, Normal bowel sounds; Genitourinary: No CVA tenderness; Spine:  No midline CS, TS, LS tenderness.;; Extremities: Pulses normal, No tenderness, No edema, No calf edema or asymmetry.; Neuro: AA&Ox3, Major CN grossly intact.  Speech clear. No gross focal motor or sensory deficits in extremities.; Skin: Color normal, Warm, Dry.   ED Course  Procedures (including critical care time) Labs Review  Imaging Review  I have  personally reviewed and evaluated these images and lab results as part of my medical decision-making.   EKG Interpretation None      MDM  MDM Reviewed: previous chart, nursing note and vitals Reviewed previous: labs Interpretation: labs, x-ray and CT scan     Results for orders placed or performed during the hospital encounter of 08/29/15  Comprehensive metabolic panel  Result Value Ref Range   Sodium 137 135 - 145 mmol/L   Potassium 3.6 3.5 - 5.1 mmol/L   Chloride 102 101 - 111 mmol/L   CO2 27 22 - 32 mmol/L   Glucose, Bld 121 (H) 65 - 99 mg/dL   BUN 9 6 - 20 mg/dL   Creatinine, Ser 6.57 0.44 - 1.00 mg/dL   Calcium 9.0 8.9 - 84.6 mg/dL   Total Protein 7.0 6.5 - 8.1 g/dL   Albumin 3.9 3.5 - 5.0 g/dL   AST 30 15 - 41 U/L   ALT 21 14 - 54 U/L   Alkaline Phosphatase 67 38 - 126 U/L   Total Bilirubin 0.8 0.3 - 1.2 mg/dL   GFR calc non Af Amer >60 >60 mL/min   GFR calc Af Amer >60 >60 mL/min   Anion gap 8 5 - 15  Lipase, blood  Result Value Ref Range   Lipase 35 11 - 51 U/L  Lactic acid, plasma  Result Value Ref Range   Lactic Acid, Venous 1.4 0.5 - 2.0 mmol/L  Lactic acid, plasma  Result Value Ref Range   Lactic Acid,  Venous 1.5 0.5 - 2.0 mmol/L  CBC with Differential  Result Value Ref Range   WBC 7.7 4.0 - 10.5 K/uL   RBC 4.46 3.87 - 5.11 MIL/uL   Hemoglobin 14.9 12.0 - 15.0 g/dL   HCT 96.2 95.2 - 84.1 %   MCV 97.1 78.0 - 100.0 fL   MCH 33.4 26.0 - 34.0 pg   MCHC 34.4 30.0 - 36.0 g/dL   RDW 32.4 40.1 - 02.7 %   Platelets 186 150 - 400 K/uL   Neutrophils Relative % 79 %   Neutro Abs 6.1 1.7 - 7.7 K/uL   Lymphocytes Relative 10 %   Lymphs Abs 0.8 0.7 - 4.0 K/uL   Monocytes Relative 10 %   Monocytes Absolute 0.8 0.1 - 1.0 K/uL   Eosinophils Relative 1 %   Eosinophils Absolute 0.0 0.0 - 0.7 K/uL   Basophils Relative 0 %   Basophils Absolute 0.0 0.0 - 0.1 K/uL  Urinalysis, Routine w reflex microscopic  Result Value Ref Range   Color, Urine YELLOW YELLOW   APPearance HAZY (A) CLEAR   Specific Gravity, Urine 1.010 1.005 - 1.030   pH 6.5 5.0 - 8.0   Glucose, UA NEGATIVE NEGATIVE mg/dL   Hgb urine dipstick NEGATIVE NEGATIVE   Bilirubin Urine NEGATIVE NEGATIVE   Ketones, ur NEGATIVE NEGATIVE mg/dL   Protein, ur NEGATIVE NEGATIVE mg/dL   Nitrite NEGATIVE NEGATIVE   Leukocytes, UA NEGATIVE NEGATIVE   Dg Chest 2 View 08/29/2015  CLINICAL DATA:  ABD PAIN AND NAUSEA X SEVERAL DAYS, WORSE OVER PAST 3 DAYS, HTN, A-FIB, NON SMOKER EXAM: CHEST  2 VIEW COMPARISON:  None. FINDINGS: Cardiac silhouette is normal in size. No mediastinal or hilar masses or convincing adenopathy. There is elevation of the left hemidiaphragm. Mild compressive atelectasis is noted at the left lung base. Lungs are otherwise clear.  No pleural effusion or pneumothorax. Bony thorax is demineralized. There is a mild to moderate compression fracture at  the thoracolumbar junction which appears chronic. IMPRESSION: 1. No acute cardiopulmonary disease. Electronically Signed   By: Amie Portland M.D.   On: 08/29/2015 11:31   Ct Abdomen Pelvis W Contrast 08/29/2015  CLINICAL DATA:  Left-sided abdominal pain with nausea for 2 weeks. Coronary  artery disease. Hypertension. Gastroesophageal reflux disease. Cholecystectomy. Appendectomy. EXAM: CT ABDOMEN AND PELVIS WITH CONTRAST TECHNIQUE: Multidetector CT imaging of the abdomen and pelvis was performed using the standard protocol following bolus administration of intravenous contrast. CONTRAST:  ISOVUE-300 IOPAMIDOL (ISOVUE-300) INJECTION 61% COMPARISON:  08/20/2015 FINDINGS: Lower chest: Clear lung bases. Mild cardiomegaly. Left hemidiaphragm elevation, with partial exclusion of stomach. Mild bilateral pleural thickening. Hepatobiliary: Normal liver. Cholecystectomy, without biliary ductal dilatation. Pancreas: Normal, without mass or ductal dilatation. Spleen: Normal in size, without focal abnormality. Adrenals/Urinary Tract: Normal adrenal glands. Normal kidneys, without hydronephrosis. Normal urinary bladder. Stomach/Bowel: Large hiatal hernia, with the majority of the stomach positioned along with surrounding fat in the lower chest.Scattered colonic diverticula. Normal terminal ileum. Normal small bowel. Vascular/Lymphatic: Aortic and branch vessel atherosclerosis. No abdominopelvic adenopathy. Reproductive: Normal uterus and adnexa. Other: No significant free fluid. Bilateral fat containing inguinal hernias. Musculoskeletal: Osteopenia.  Convex left lumbar spine curvature. IMPRESSION: 1.  No acute process in the abdomen or pelvis. 2. Large hiatal hernia. 3.  Coronary artery atherosclerosis. Aortic atherosclerosis. Electronically Signed   By: Jeronimo Greaves M.D.   On: 08/29/2015 13:46     1440:  Pt has tol PO well while in the ED without N/V.  No stooling while in the ED.  VSS. Pt and family want pt to "be admitted."  No medical indication for admission at this time; this explained to pt and family who verb understanding.  Tx symptomatically, f/u GI MD. Dx and testing d/w pt.  Questions answered.  Verb understanding, agreeable to d/c home with outpt f/u.    Samuel Jester, DO 09/03/15  1241

## 2015-08-29 NOTE — Discharge Instructions (Signed)
Eat a bland diet, avoiding greasy, fatty, fried foods, as well as spicy and acidic foods or beverages.  Avoid eating within the hour or 2 before going to bed or laying down.  Also avoid teas, colas, coffee, chocolate, pepermint and spearment. Take the prescriptions as directed.  Call your regular medical doctor and your GI doctor today to schedule a follow up appointment in the next 3 days.  Return to the Emergency Department immediately if worsening.

## 2015-08-29 NOTE — ED Notes (Signed)
Pt from home c/o LT sided abdominal pain that radiates to back with nausea. Pt diagnosed with diverticulits at [facility] 2 weeks ago. Pt denies any improvement of symptoms.

## 2015-09-03 DIAGNOSIS — R5383 Other fatigue: Secondary | ICD-10-CM | POA: Diagnosis not present

## 2015-09-03 DIAGNOSIS — M4684 Other specified inflammatory spondylopathies, thoracic region: Secondary | ICD-10-CM | POA: Diagnosis not present

## 2015-09-03 DIAGNOSIS — I10 Essential (primary) hypertension: Secondary | ICD-10-CM | POA: Diagnosis not present

## 2015-09-03 DIAGNOSIS — K5712 Diverticulitis of small intestine without perforation or abscess without bleeding: Secondary | ICD-10-CM | POA: Diagnosis not present

## 2015-09-03 DIAGNOSIS — M545 Low back pain: Secondary | ICD-10-CM | POA: Diagnosis not present

## 2015-09-03 DIAGNOSIS — M1990 Unspecified osteoarthritis, unspecified site: Secondary | ICD-10-CM | POA: Diagnosis not present

## 2015-09-03 DIAGNOSIS — K219 Gastro-esophageal reflux disease without esophagitis: Secondary | ICD-10-CM | POA: Diagnosis not present

## 2015-09-05 DIAGNOSIS — M4854XA Collapsed vertebra, not elsewhere classified, thoracic region, initial encounter for fracture: Secondary | ICD-10-CM | POA: Diagnosis not present

## 2015-09-05 DIAGNOSIS — R1032 Left lower quadrant pain: Secondary | ICD-10-CM | POA: Diagnosis not present

## 2015-09-05 DIAGNOSIS — M545 Low back pain: Secondary | ICD-10-CM | POA: Diagnosis not present

## 2015-09-05 DIAGNOSIS — M25552 Pain in left hip: Secondary | ICD-10-CM | POA: Diagnosis not present

## 2015-09-05 DIAGNOSIS — M546 Pain in thoracic spine: Secondary | ICD-10-CM | POA: Diagnosis not present

## 2015-09-11 DIAGNOSIS — Z7982 Long term (current) use of aspirin: Secondary | ICD-10-CM | POA: Diagnosis not present

## 2015-09-11 DIAGNOSIS — G8929 Other chronic pain: Secondary | ICD-10-CM | POA: Diagnosis not present

## 2015-09-11 DIAGNOSIS — K219 Gastro-esophageal reflux disease without esophagitis: Secondary | ICD-10-CM | POA: Diagnosis not present

## 2015-09-11 DIAGNOSIS — R110 Nausea: Secondary | ICD-10-CM | POA: Diagnosis not present

## 2015-09-11 DIAGNOSIS — M545 Low back pain: Secondary | ICD-10-CM | POA: Diagnosis not present

## 2015-09-11 DIAGNOSIS — R404 Transient alteration of awareness: Secondary | ICD-10-CM | POA: Diagnosis not present

## 2015-09-11 DIAGNOSIS — R531 Weakness: Secondary | ICD-10-CM | POA: Diagnosis not present

## 2015-09-11 DIAGNOSIS — Z885 Allergy status to narcotic agent status: Secondary | ICD-10-CM | POA: Diagnosis not present

## 2015-09-11 DIAGNOSIS — F419 Anxiety disorder, unspecified: Secondary | ICD-10-CM | POA: Diagnosis not present

## 2015-09-11 DIAGNOSIS — Z79899 Other long term (current) drug therapy: Secondary | ICD-10-CM | POA: Diagnosis not present

## 2015-09-11 DIAGNOSIS — M5136 Other intervertebral disc degeneration, lumbar region: Secondary | ICD-10-CM | POA: Diagnosis not present

## 2015-09-11 DIAGNOSIS — R42 Dizziness and giddiness: Secondary | ICD-10-CM | POA: Diagnosis not present

## 2015-09-11 DIAGNOSIS — F329 Major depressive disorder, single episode, unspecified: Secondary | ICD-10-CM | POA: Diagnosis not present

## 2015-09-12 DIAGNOSIS — R42 Dizziness and giddiness: Secondary | ICD-10-CM | POA: Diagnosis not present

## 2015-09-12 DIAGNOSIS — M545 Low back pain: Secondary | ICD-10-CM | POA: Diagnosis not present

## 2015-09-13 DIAGNOSIS — R42 Dizziness and giddiness: Secondary | ICD-10-CM | POA: Diagnosis not present

## 2015-09-13 DIAGNOSIS — M545 Low back pain: Secondary | ICD-10-CM | POA: Diagnosis not present

## 2015-09-22 DIAGNOSIS — R42 Dizziness and giddiness: Secondary | ICD-10-CM | POA: Diagnosis not present

## 2015-09-22 DIAGNOSIS — M545 Low back pain: Secondary | ICD-10-CM | POA: Diagnosis not present

## 2015-09-22 DIAGNOSIS — R1084 Generalized abdominal pain: Secondary | ICD-10-CM | POA: Diagnosis not present

## 2015-09-22 DIAGNOSIS — Z6835 Body mass index (BMI) 35.0-35.9, adult: Secondary | ICD-10-CM | POA: Diagnosis not present

## 2015-09-22 DIAGNOSIS — M4854XA Collapsed vertebra, not elsewhere classified, thoracic region, initial encounter for fracture: Secondary | ICD-10-CM | POA: Diagnosis not present

## 2015-09-25 DIAGNOSIS — M859 Disorder of bone density and structure, unspecified: Secondary | ICD-10-CM | POA: Diagnosis not present

## 2015-09-25 DIAGNOSIS — S22000A Wedge compression fracture of unspecified thoracic vertebra, initial encounter for closed fracture: Secondary | ICD-10-CM | POA: Diagnosis not present

## 2015-09-25 DIAGNOSIS — M545 Low back pain: Secondary | ICD-10-CM | POA: Diagnosis not present

## 2015-09-25 DIAGNOSIS — M47816 Spondylosis without myelopathy or radiculopathy, lumbar region: Secondary | ICD-10-CM | POA: Diagnosis not present

## 2015-10-01 DIAGNOSIS — M069 Rheumatoid arthritis, unspecified: Secondary | ICD-10-CM | POA: Diagnosis not present

## 2015-10-01 DIAGNOSIS — S22080A Wedge compression fracture of T11-T12 vertebra, initial encounter for closed fracture: Secondary | ICD-10-CM | POA: Diagnosis not present

## 2015-10-01 DIAGNOSIS — I2510 Atherosclerotic heart disease of native coronary artery without angina pectoris: Secondary | ICD-10-CM | POA: Diagnosis not present

## 2015-10-01 DIAGNOSIS — I10 Essential (primary) hypertension: Secondary | ICD-10-CM | POA: Diagnosis not present

## 2015-10-01 DIAGNOSIS — E7800 Pure hypercholesterolemia, unspecified: Secondary | ICD-10-CM | POA: Diagnosis not present

## 2015-10-01 DIAGNOSIS — M4854XA Collapsed vertebra, not elsewhere classified, thoracic region, initial encounter for fracture: Secondary | ICD-10-CM | POA: Diagnosis not present

## 2015-10-01 DIAGNOSIS — M4852XA Collapsed vertebra, not elsewhere classified, cervical region, initial encounter for fracture: Secondary | ICD-10-CM | POA: Diagnosis not present

## 2015-10-02 DIAGNOSIS — I10 Essential (primary) hypertension: Secondary | ICD-10-CM | POA: Diagnosis not present

## 2015-10-02 DIAGNOSIS — M4852XA Collapsed vertebra, not elsewhere classified, cervical region, initial encounter for fracture: Secondary | ICD-10-CM | POA: Diagnosis not present

## 2015-10-02 DIAGNOSIS — E7800 Pure hypercholesterolemia, unspecified: Secondary | ICD-10-CM | POA: Diagnosis not present

## 2015-10-07 DIAGNOSIS — R2689 Other abnormalities of gait and mobility: Secondary | ICD-10-CM | POA: Diagnosis not present

## 2015-10-07 DIAGNOSIS — M4850XD Collapsed vertebra, not elsewhere classified, site unspecified, subsequent encounter for fracture with routine healing: Secondary | ICD-10-CM | POA: Diagnosis not present

## 2015-10-07 DIAGNOSIS — F411 Generalized anxiety disorder: Secondary | ICD-10-CM | POA: Diagnosis not present

## 2015-10-07 DIAGNOSIS — Z4789 Encounter for other orthopedic aftercare: Secondary | ICD-10-CM | POA: Diagnosis not present

## 2015-10-07 DIAGNOSIS — Z96651 Presence of right artificial knee joint: Secondary | ICD-10-CM | POA: Diagnosis not present

## 2015-10-07 DIAGNOSIS — R42 Dizziness and giddiness: Secondary | ICD-10-CM | POA: Diagnosis not present

## 2015-10-07 DIAGNOSIS — Z9181 History of falling: Secondary | ICD-10-CM | POA: Diagnosis not present

## 2015-10-07 DIAGNOSIS — I25119 Atherosclerotic heart disease of native coronary artery with unspecified angina pectoris: Secondary | ICD-10-CM | POA: Diagnosis not present

## 2015-10-07 DIAGNOSIS — F329 Major depressive disorder, single episode, unspecified: Secondary | ICD-10-CM | POA: Diagnosis not present

## 2015-10-13 DIAGNOSIS — R2689 Other abnormalities of gait and mobility: Secondary | ICD-10-CM | POA: Diagnosis not present

## 2015-10-13 DIAGNOSIS — I25119 Atherosclerotic heart disease of native coronary artery with unspecified angina pectoris: Secondary | ICD-10-CM | POA: Diagnosis not present

## 2015-10-13 DIAGNOSIS — R42 Dizziness and giddiness: Secondary | ICD-10-CM | POA: Diagnosis not present

## 2015-10-13 DIAGNOSIS — M4850XD Collapsed vertebra, not elsewhere classified, site unspecified, subsequent encounter for fracture with routine healing: Secondary | ICD-10-CM | POA: Diagnosis not present

## 2015-10-13 DIAGNOSIS — F411 Generalized anxiety disorder: Secondary | ICD-10-CM | POA: Diagnosis not present

## 2015-10-13 DIAGNOSIS — Z4789 Encounter for other orthopedic aftercare: Secondary | ICD-10-CM | POA: Diagnosis not present

## 2015-10-15 DIAGNOSIS — Z4789 Encounter for other orthopedic aftercare: Secondary | ICD-10-CM | POA: Diagnosis not present

## 2015-10-15 DIAGNOSIS — R42 Dizziness and giddiness: Secondary | ICD-10-CM | POA: Diagnosis not present

## 2015-10-15 DIAGNOSIS — I25119 Atherosclerotic heart disease of native coronary artery with unspecified angina pectoris: Secondary | ICD-10-CM | POA: Diagnosis not present

## 2015-10-15 DIAGNOSIS — M4850XD Collapsed vertebra, not elsewhere classified, site unspecified, subsequent encounter for fracture with routine healing: Secondary | ICD-10-CM | POA: Diagnosis not present

## 2015-10-15 DIAGNOSIS — R2689 Other abnormalities of gait and mobility: Secondary | ICD-10-CM | POA: Diagnosis not present

## 2015-10-15 DIAGNOSIS — F411 Generalized anxiety disorder: Secondary | ICD-10-CM | POA: Diagnosis not present

## 2015-10-20 DIAGNOSIS — F411 Generalized anxiety disorder: Secondary | ICD-10-CM | POA: Diagnosis not present

## 2015-10-20 DIAGNOSIS — R42 Dizziness and giddiness: Secondary | ICD-10-CM | POA: Diagnosis not present

## 2015-10-20 DIAGNOSIS — I25119 Atherosclerotic heart disease of native coronary artery with unspecified angina pectoris: Secondary | ICD-10-CM | POA: Diagnosis not present

## 2015-10-20 DIAGNOSIS — M4850XD Collapsed vertebra, not elsewhere classified, site unspecified, subsequent encounter for fracture with routine healing: Secondary | ICD-10-CM | POA: Diagnosis not present

## 2015-10-20 DIAGNOSIS — Z4789 Encounter for other orthopedic aftercare: Secondary | ICD-10-CM | POA: Diagnosis not present

## 2015-10-20 DIAGNOSIS — R2689 Other abnormalities of gait and mobility: Secondary | ICD-10-CM | POA: Diagnosis not present

## 2015-10-21 IMAGING — CT CT ABD-PELV W/ CM
2 of 5 series · 16 of 46 positions shown, 18 images · IV contrast (Omnipaque 300)
Comparison: CT Abdomen and Pelvis 01/06/2014 and earlier.

CLINICAL DATA: 85-year-old female with left lower quadrant
abdominal pain, epigastric pain. Bloating. Recent diverticulitis
treated with antibiotics. Initial encounter.

EXAM:
CT ABDOMEN AND PELVIS WITH CONTRAST
TECHNIQUE: Multidetector CT imaging of the abdomen and pelvis was performed
using the standard protocol following bolus administration of
intravenous contrast.
CONTRAST:  100mL OMNIPAQUE IOHEXOL 300 MG/ML  SOLN

[Series 2: abd_pel_with 5.0 b40f · axial · 0.82mm/px · z∈[-483,-83]mm · 13 of 90 slices shown, 15 images]
[im 5/90  soft-tissue]
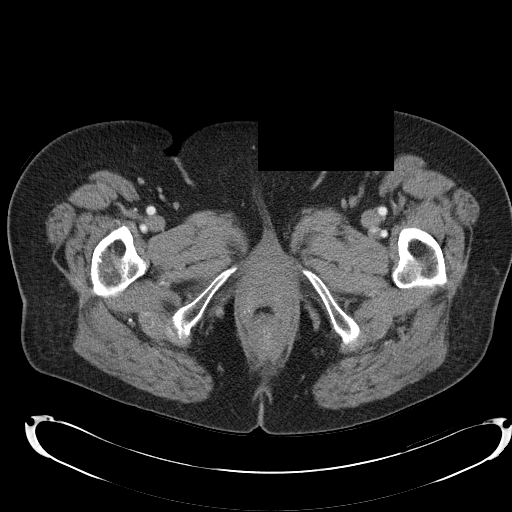
[im 5/90  bone]
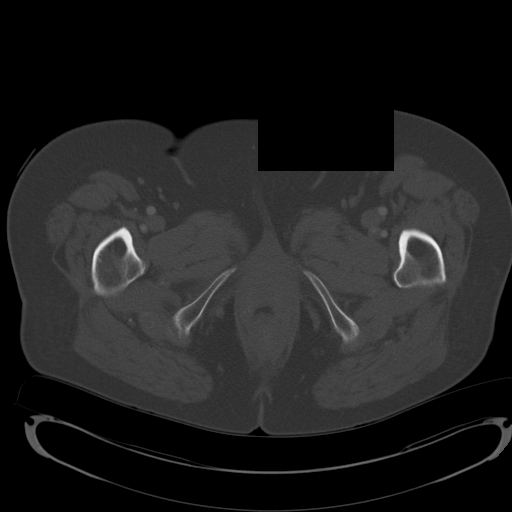
[im 10/90  soft-tissue]
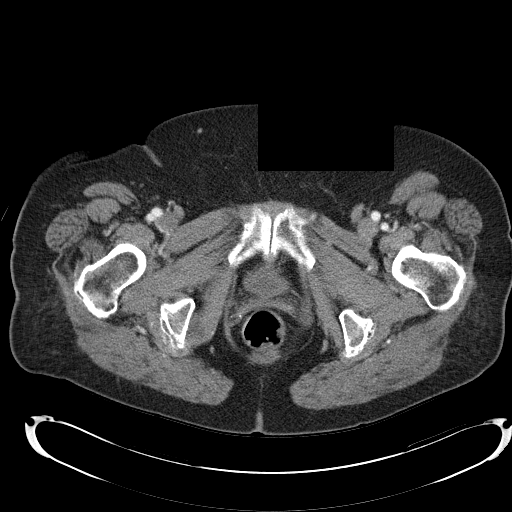
[im 20/90  soft-tissue]
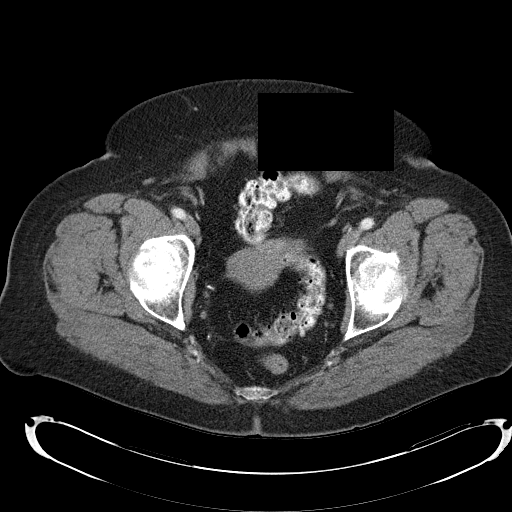
[im 25/90  soft-tissue]
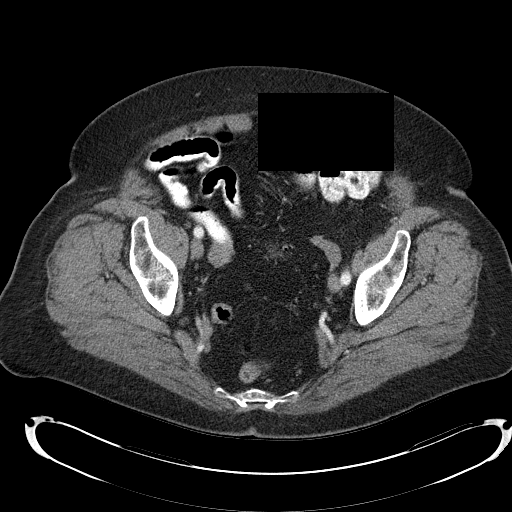
[im 30/90  soft-tissue]
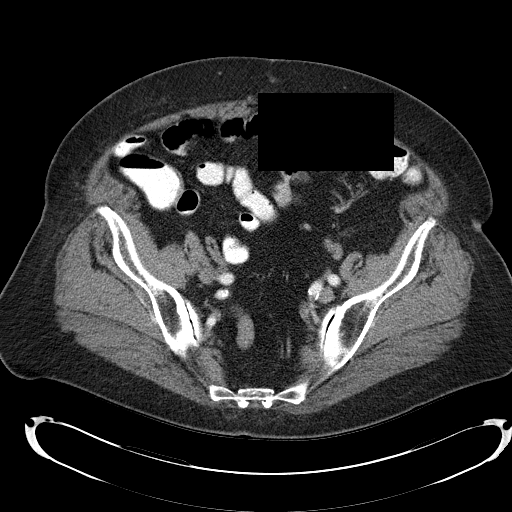
[im 40/90  soft-tissue]
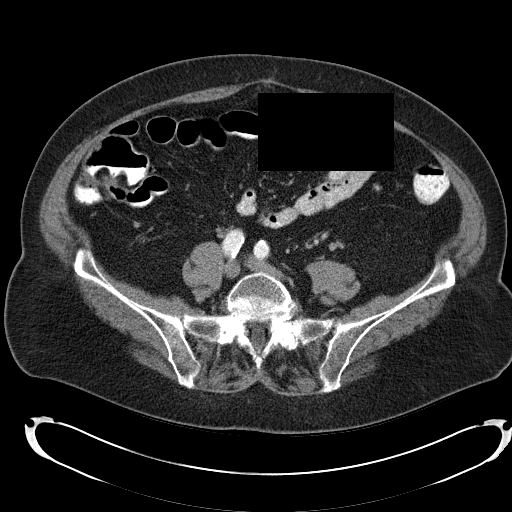
[im 45/90  soft-tissue]
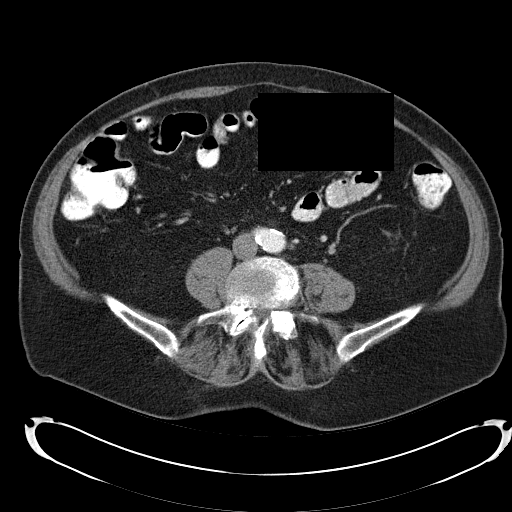
[im 50/90  soft-tissue]
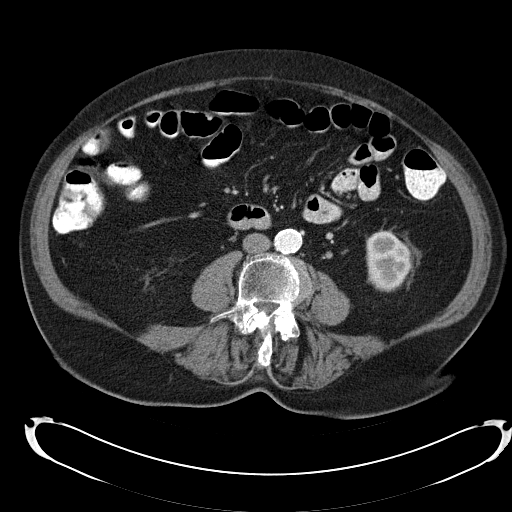
[im 60/90  soft-tissue]
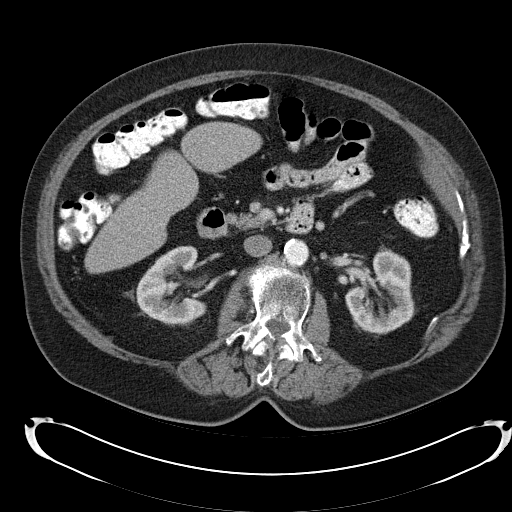
[im 60/90  bone]
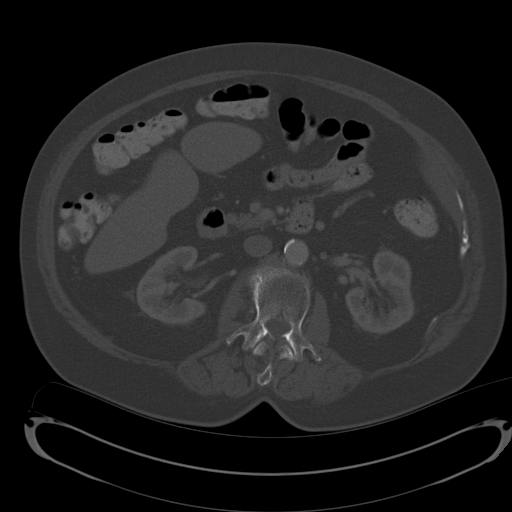
[im 65/90  soft-tissue]
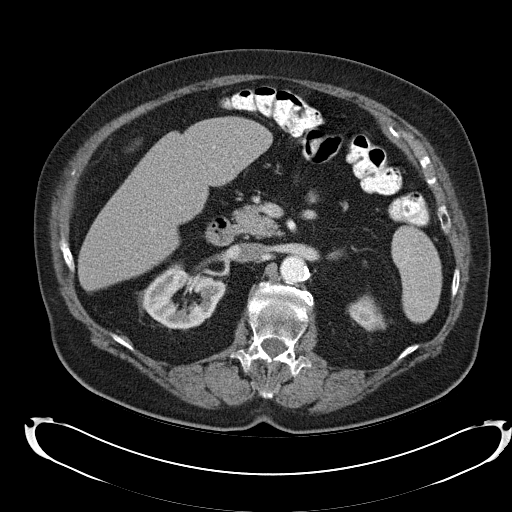
[im 70/90  soft-tissue]
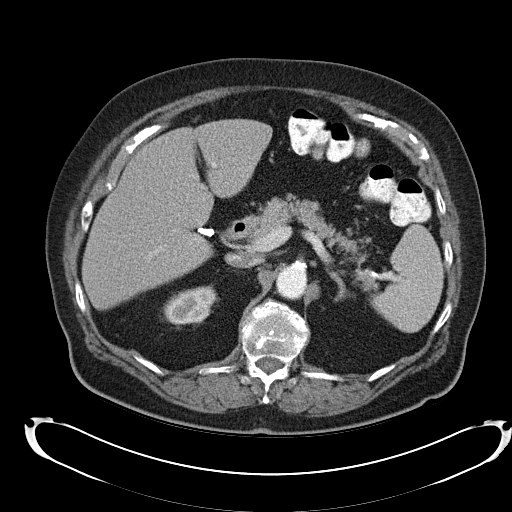
[im 80/90  soft-tissue]
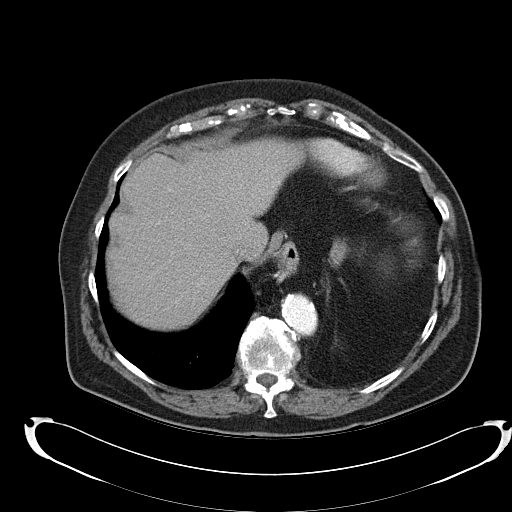
[im 85/90  soft-tissue]
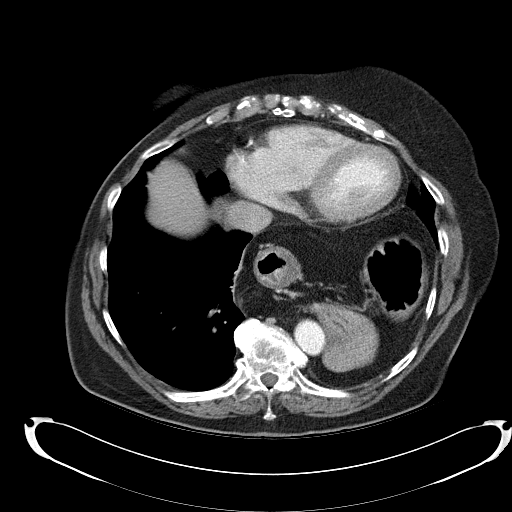

[Series 3: abd_pel_with 3.0 spo cor · coronal · 0.82mm/px · 3 of 89 slices shown]
[im 30/89  soft-tissue]
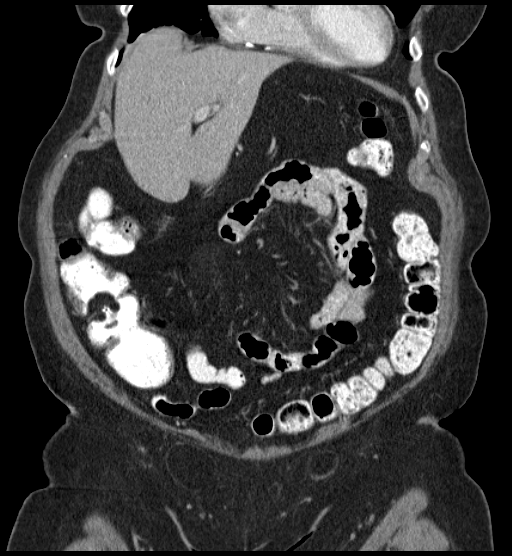
[im 40/89  soft-tissue]
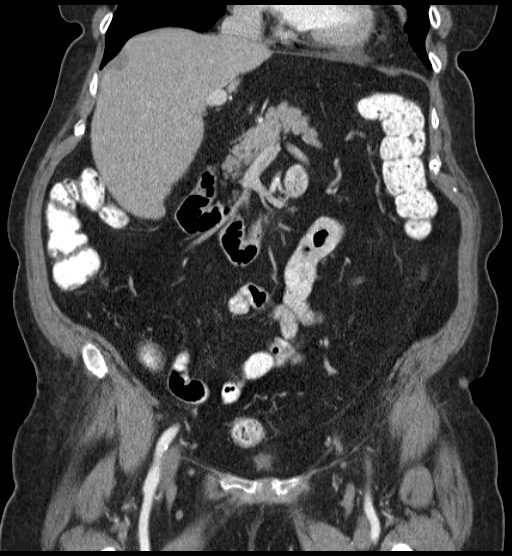
[im 49/89  soft-tissue]
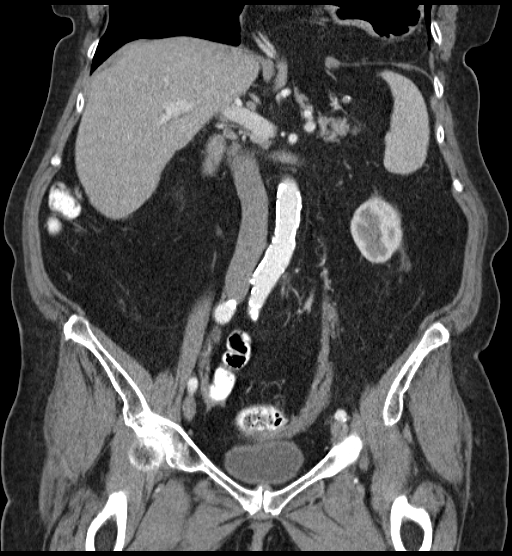

[16 of 46 positions shown; findings below may reference images not displayed]

FINDINGS: Chronic large gastric and mesenteric fact containing wide mouthed
hiatal hernia, simulating elevation of the left hemidiaphragm, and
incompletely visualized.

Stable visualized osseous structures.

Stable fact containing bilateral inguinal hernias. No pelvic free
fluid. Negative uterus and adnexa. Unremarkable bladder. Negative
rectum.

Sigmoid diverticulosis. Oral contrast has reached the mid sigmoid
colon. No acute inflammatory stranding.

Negative left colon, transverse colon. Occasional right colon
diverticula, no active inflammation. Appendix not identified. No
pericecal inflammation. Negative terminal ileum. No dilated or
inflamed small bowel loops. Intra thoracic stomach owing to the
large hernia. Negative duodenum.

Surgically absent gallbladder. Negative liver, spleen, pancreas,
adrenal glands, and kidneys. No abdominal free fluid. Portal venous
system appears patent. Aortoiliac calcified atherosclerosis noted.
Major arterial structures are patent. No lymphadenopathy.
IMPRESSION: 1. No acute or inflammatory process identified in the abdomen or
pelvis.
2. Diverticulosis of the sigmoid colon without active inflammation.
3. Large chronic gastric and mesentery containing hiatal hernia with
partially visible intra thoracic stomach.

## 2015-10-22 DIAGNOSIS — R2689 Other abnormalities of gait and mobility: Secondary | ICD-10-CM | POA: Diagnosis not present

## 2015-10-22 DIAGNOSIS — F411 Generalized anxiety disorder: Secondary | ICD-10-CM | POA: Diagnosis not present

## 2015-10-22 DIAGNOSIS — I25119 Atherosclerotic heart disease of native coronary artery with unspecified angina pectoris: Secondary | ICD-10-CM | POA: Diagnosis not present

## 2015-10-22 DIAGNOSIS — R42 Dizziness and giddiness: Secondary | ICD-10-CM | POA: Diagnosis not present

## 2015-10-22 DIAGNOSIS — Z4789 Encounter for other orthopedic aftercare: Secondary | ICD-10-CM | POA: Diagnosis not present

## 2015-10-22 DIAGNOSIS — M4850XD Collapsed vertebra, not elsewhere classified, site unspecified, subsequent encounter for fracture with routine healing: Secondary | ICD-10-CM | POA: Diagnosis not present

## 2015-10-27 DIAGNOSIS — Z4789 Encounter for other orthopedic aftercare: Secondary | ICD-10-CM | POA: Diagnosis not present

## 2015-10-27 DIAGNOSIS — F411 Generalized anxiety disorder: Secondary | ICD-10-CM | POA: Diagnosis not present

## 2015-10-27 DIAGNOSIS — R42 Dizziness and giddiness: Secondary | ICD-10-CM | POA: Diagnosis not present

## 2015-10-27 DIAGNOSIS — R2689 Other abnormalities of gait and mobility: Secondary | ICD-10-CM | POA: Diagnosis not present

## 2015-10-27 DIAGNOSIS — I25119 Atherosclerotic heart disease of native coronary artery with unspecified angina pectoris: Secondary | ICD-10-CM | POA: Diagnosis not present

## 2015-10-27 DIAGNOSIS — M4850XD Collapsed vertebra, not elsewhere classified, site unspecified, subsequent encounter for fracture with routine healing: Secondary | ICD-10-CM | POA: Diagnosis not present

## 2015-10-31 DIAGNOSIS — Z4789 Encounter for other orthopedic aftercare: Secondary | ICD-10-CM | POA: Diagnosis not present

## 2015-10-31 DIAGNOSIS — I25119 Atherosclerotic heart disease of native coronary artery with unspecified angina pectoris: Secondary | ICD-10-CM | POA: Diagnosis not present

## 2015-10-31 DIAGNOSIS — F411 Generalized anxiety disorder: Secondary | ICD-10-CM | POA: Diagnosis not present

## 2015-10-31 DIAGNOSIS — M4850XD Collapsed vertebra, not elsewhere classified, site unspecified, subsequent encounter for fracture with routine healing: Secondary | ICD-10-CM | POA: Diagnosis not present

## 2015-10-31 DIAGNOSIS — R42 Dizziness and giddiness: Secondary | ICD-10-CM | POA: Diagnosis not present

## 2015-10-31 DIAGNOSIS — R2689 Other abnormalities of gait and mobility: Secondary | ICD-10-CM | POA: Diagnosis not present

## 2015-11-03 DIAGNOSIS — R2689 Other abnormalities of gait and mobility: Secondary | ICD-10-CM | POA: Diagnosis not present

## 2015-11-03 DIAGNOSIS — Z4789 Encounter for other orthopedic aftercare: Secondary | ICD-10-CM | POA: Diagnosis not present

## 2015-11-03 DIAGNOSIS — F411 Generalized anxiety disorder: Secondary | ICD-10-CM | POA: Diagnosis not present

## 2015-11-03 DIAGNOSIS — M4850XD Collapsed vertebra, not elsewhere classified, site unspecified, subsequent encounter for fracture with routine healing: Secondary | ICD-10-CM | POA: Diagnosis not present

## 2015-11-03 DIAGNOSIS — I25119 Atherosclerotic heart disease of native coronary artery with unspecified angina pectoris: Secondary | ICD-10-CM | POA: Diagnosis not present

## 2015-11-03 DIAGNOSIS — R42 Dizziness and giddiness: Secondary | ICD-10-CM | POA: Diagnosis not present

## 2015-11-03 HISTORY — PX: THORACIC DISC SURGERY: SHX801

## 2015-11-05 DIAGNOSIS — I25119 Atherosclerotic heart disease of native coronary artery with unspecified angina pectoris: Secondary | ICD-10-CM | POA: Diagnosis not present

## 2015-11-05 DIAGNOSIS — R42 Dizziness and giddiness: Secondary | ICD-10-CM | POA: Diagnosis not present

## 2015-11-05 DIAGNOSIS — R2689 Other abnormalities of gait and mobility: Secondary | ICD-10-CM | POA: Diagnosis not present

## 2015-11-05 DIAGNOSIS — Z4789 Encounter for other orthopedic aftercare: Secondary | ICD-10-CM | POA: Diagnosis not present

## 2015-11-05 DIAGNOSIS — F411 Generalized anxiety disorder: Secondary | ICD-10-CM | POA: Diagnosis not present

## 2015-11-05 DIAGNOSIS — M4850XD Collapsed vertebra, not elsewhere classified, site unspecified, subsequent encounter for fracture with routine healing: Secondary | ICD-10-CM | POA: Diagnosis not present

## 2015-11-07 HISTORY — PX: OTHER SURGICAL HISTORY: SHX169

## 2015-11-11 DIAGNOSIS — Z780 Asymptomatic menopausal state: Secondary | ICD-10-CM | POA: Diagnosis not present

## 2015-11-11 DIAGNOSIS — M8589 Other specified disorders of bone density and structure, multiple sites: Secondary | ICD-10-CM | POA: Diagnosis not present

## 2015-11-11 DIAGNOSIS — M85861 Other specified disorders of bone density and structure, right lower leg: Secondary | ICD-10-CM | POA: Diagnosis not present

## 2015-11-11 DIAGNOSIS — I10 Essential (primary) hypertension: Secondary | ICD-10-CM | POA: Diagnosis not present

## 2015-11-11 DIAGNOSIS — Z79899 Other long term (current) drug therapy: Secondary | ICD-10-CM | POA: Diagnosis not present

## 2015-11-11 DIAGNOSIS — Z7982 Long term (current) use of aspirin: Secondary | ICD-10-CM | POA: Diagnosis not present

## 2015-11-11 DIAGNOSIS — G2581 Restless legs syndrome: Secondary | ICD-10-CM | POA: Diagnosis not present

## 2015-12-23 ENCOUNTER — Ambulatory Visit (INDEPENDENT_AMBULATORY_CARE_PROVIDER_SITE_OTHER): Payer: Medicare Other | Admitting: Internal Medicine

## 2015-12-23 ENCOUNTER — Encounter (INDEPENDENT_AMBULATORY_CARE_PROVIDER_SITE_OTHER): Payer: Self-pay | Admitting: Internal Medicine

## 2015-12-23 VITALS — BP 130/82 | HR 62 | Temp 98.6°F | Resp 18 | Ht 63.0 in | Wt 202.2 lb

## 2015-12-23 DIAGNOSIS — I2510 Atherosclerotic heart disease of native coronary artery without angina pectoris: Secondary | ICD-10-CM | POA: Diagnosis not present

## 2015-12-23 DIAGNOSIS — K589 Irritable bowel syndrome without diarrhea: Secondary | ICD-10-CM | POA: Diagnosis not present

## 2015-12-23 DIAGNOSIS — K5732 Diverticulitis of large intestine without perforation or abscess without bleeding: Secondary | ICD-10-CM | POA: Diagnosis not present

## 2015-12-23 MED ORDER — METRONIDAZOLE 500 MG PO TABS: 500.0000 mg | ORAL_TABLET | Freq: Two times a day (BID) | ORAL | 0 refills | Status: DC

## 2015-12-23 MED ORDER — CIPROFLOXACIN HCL 500 MG PO TABS: 500.0000 mg | ORAL_TABLET | Freq: Two times a day (BID) | ORAL | 0 refills | Status: DC

## 2015-12-23 NOTE — Patient Instructions (Signed)
Discontinue meloxicam. Keep dicyclomine dose to minimum. Progress report in one week.

## 2015-12-23 NOTE — Progress Notes (Signed)
Presenting complaint;  Follow-up for lower abdominal pain.  Database and Subjective:  Patient is 82 year old Caucasian female who is here for scheduled visit. She was last seen in her office on 06/23/2015 when she was felt to have diverticulitis. Patient states she was briefly hospitalized twice in July 2017 for diverticulitis(MMH). Her pain never responded to antibiotics. It was felt that her pain may be referred from her back. She therefore went on to have kyphoplasty on 10/28/2015. She states her back pain has decreased in severity and gradually getting better. She is using cane to move around. She continues to complain of pain in left lower quadrant for abdomen. She states Dr. Dimas Aguas increase her dicyclomine to 20 mg 3 times a day. Previously she was on 20 mg twice daily. She wonders if she can take 4 pills a day. She is not having any side effects. She dates LLQ abdominal pain has been worse since yesterday. She states pain is similar to the pain she had in the past when she was diagnosed with diverticulitis. She has not had fever or chills. She denies nausea and/or vomiting. She feels heartburn is well controlled with dietary measures. She is present not taking PPI. She remains on meloxicam. She is using tramadol on as needed basis.   Current Medications: Outpatient Encounter Prescriptions as of 12/23/2015  Medication Sig  . acetaminophen (TYLENOL) 500 MG tablet Take 500 mg by mouth every 6 (six) hours as needed for moderate pain.  Marland Kitchen aspirin 81 MG tablet Take 81 mg by mouth daily.    . Cholecalciferol (VITAMIN D-3) 1000 UNITS CAPS Take 800 Units by mouth daily.   . diazepam (VALIUM) 5 MG tablet TAKE 1/2 TABLET EVERY 6 HOURS AS NEEDED FOR ANXIETY  . dicyclomine (BENTYL) 20 MG tablet Take 1 tablet (20 mg total) by mouth every 6 (six) hours as needed for spasms (abdominal cramping).  . fish oil-omega-3 fatty acids 1000 MG capsule Take 1 g by mouth daily.  Marland Kitchen gabapentin (NEURONTIN) 300 MG capsule  Take 300 mg by mouth 3 (three) times daily.    Marland Kitchen lidocaine (LIDODERM) 5 % Place 2 patches onto the skin daily. Remove & Discard patch within 12 hours or as directed by MD  . lisinopril (PRINIVIL,ZESTRIL) 40 MG tablet Take 40 mg by mouth daily.    . meclizine (ANTIVERT) 25 MG tablet Take 25 mg by mouth 3 (three) times daily as needed for dizziness.   . meloxicam (MOBIC) 7.5 MG tablet Take 7.5 mg by mouth 2 (two) times daily.  . metoprolol (TOPROL-XL) 50 MG 24 hr tablet Take 50 mg by mouth daily.   . Multiple Vitamin (MULTIVITAMIN) tablet Take 1 tablet by mouth daily.    . nitroGLYCERIN (NITROSTAT) 0.4 MG SL tablet Place 1 tablet (0.4 mg total) under the tongue every 5 (five) minutes as needed for chest pain.  Marland Kitchen ondansetron (ZOFRAN) 4 MG tablet TAKE ONE TABLET TWICE DAILY AS NEEDED  . simvastatin (ZOCOR) 20 MG tablet Take 20 mg by mouth at bedtime.    . traMADol (ULTRAM) 50 MG tablet Take 1 tablet (50 mg total) by mouth every 6 (six) hours as needed for moderate pain or severe pain.  . [DISCONTINUED] ciprofloxacin (CIPRO) 250 MG tablet Take 1 tablet by mouth 2 (two) times daily.  . [DISCONTINUED] metroNIDAZOLE (FLAGYL) 500 MG tablet Take 1 tablet by mouth 4 (four) times daily.  . [DISCONTINUED] ranitidine (ZANTAC) 150 MG tablet Take 150 mg by mouth 2 (two) times daily.  . [  DISCONTINUED] sucralfate (CARAFATE) 1 g tablet TAKE TWO TABLETS BY MOUTH AT BEDTIME DAILY (Patient not taking: Reported on 12/23/2015)  . [DISCONTINUED] traMADol (ULTRAM) 50 MG tablet Take 1 tablet (50 mg total) by mouth every 6 (six) hours as needed. (Patient not taking: Reported on 12/23/2015)   No facility-administered encounter medications on file as of 12/23/2015.      Objective: Blood pressure 130/82, pulse 62, temperature 98.6 F (37 C), temperature source Oral, resp. rate 18, height 5\' 3"  (1.6 m), weight 202 lb 3.2 oz (91.7 kg). Patient is alert and in no acute distress. She is able to move from chair to examination  table with minimal assistance. Conjunctiva is pink. Sclera is nonicteric Oropharyngeal mucosa is normal. No neck masses or thyromegaly noted. Cardiac exam with regular rhythm normal S1 and S2. No murmur or gallop noted. Lungs are clear to auscultation. Abdomen is full. Bowel sounds are normal. On palpation abdomen is soft with mild to moderate tenderness in LLQ without guarding or rebound. No organomegaly or masses. No LE edema or clubbing noted.   Assessment:  #1. Recurrent LLQ abdominal pain suggestive of diverticulitis which she's had multiple times. I believe NSAID therapy may be the culprit. She is at risk for complicated diverticulitis and therefore needs to come off NSAIDs she is willing to do so. #2. History of IBS. She is on 60 mg of dicyclomine daily and luckily not having any side effects.   Plan:  Patient advised to discontinue meloxicam. Furthermore she was also advised not to take other OTC NSAIDs. I do not see contraindication to use of tramadol for her back pain. Cipro 500 mg by mouth twice a day for 10 days. Metronidazole 500 mg by mouth twice a day for 10 days. Patient advised to call office with progress report in one week. Patient also advised to keep dicyclomine dose to 20 mg twice a day if possible. and ordered to minimize side effects of therapy. Office visit on as-needed basis.

## 2015-12-31 DIAGNOSIS — Z23 Encounter for immunization: Secondary | ICD-10-CM | POA: Diagnosis not present

## 2016-01-08 ENCOUNTER — Encounter (HOSPITAL_COMMUNITY): Payer: Self-pay | Admitting: Emergency Medicine

## 2016-01-08 ENCOUNTER — Emergency Department (HOSPITAL_COMMUNITY)
Admission: EM | Admit: 2016-01-08 | Discharge: 2016-01-08 | Disposition: A | Discharge status: Home / Self Care | Payer: Medicare Other | Attending: Emergency Medicine | Admitting: Emergency Medicine

## 2016-01-08 ENCOUNTER — Emergency Department (HOSPITAL_COMMUNITY): Payer: Medicare Other

## 2016-01-08 DIAGNOSIS — I10 Essential (primary) hypertension: Secondary | ICD-10-CM | POA: Diagnosis not present

## 2016-01-08 DIAGNOSIS — R110 Nausea: Secondary | ICD-10-CM | POA: Diagnosis not present

## 2016-01-08 DIAGNOSIS — Z79899 Other long term (current) drug therapy: Secondary | ICD-10-CM | POA: Diagnosis not present

## 2016-01-08 DIAGNOSIS — R5383 Other fatigue: Secondary | ICD-10-CM | POA: Diagnosis not present

## 2016-01-08 DIAGNOSIS — Z7982 Long term (current) use of aspirin: Secondary | ICD-10-CM | POA: Diagnosis not present

## 2016-01-08 DIAGNOSIS — I2510 Atherosclerotic heart disease of native coronary artery without angina pectoris: Secondary | ICD-10-CM | POA: Diagnosis not present

## 2016-01-08 DIAGNOSIS — K449 Diaphragmatic hernia without obstruction or gangrene: Secondary | ICD-10-CM | POA: Diagnosis not present

## 2016-01-08 DIAGNOSIS — R1032 Left lower quadrant pain: Secondary | ICD-10-CM | POA: Insufficient documentation

## 2016-01-08 LAB — COMPREHENSIVE METABOLIC PANEL
ALBUMIN: 3.9 g/dL (ref 3.5–5.0)
ALK PHOS: 48 U/L (ref 38–126)
ALT: 19 U/L (ref 14–54)
AST: 27 U/L (ref 15–41)
Anion gap: 6 (ref 5–15)
BILIRUBIN TOTAL: 1.1 mg/dL (ref 0.3–1.2)
BUN: 10 mg/dL (ref 6–20)
CALCIUM: 9.1 mg/dL (ref 8.9–10.3)
CO2: 27 mmol/L (ref 22–32)
CREATININE: 0.88 mg/dL (ref 0.44–1.00)
Chloride: 104 mmol/L (ref 101–111)
GFR calc Af Amer: >60 mL/min (ref >60)
GFR, EST NON AFRICAN AMERICAN: 59 mL/min — AB (ref >60)
GLUCOSE: 129 mg/dL — AB (ref 65–99)
Potassium: 3.8 mmol/L (ref 3.5–5.1)
Sodium: 137 mmol/L (ref 135–145)
TOTAL PROTEIN: 6.9 g/dL (ref 6.5–8.1)

## 2016-01-08 LAB — URINALYSIS, ROUTINE W REFLEX MICROSCOPIC
BILIRUBIN URINE: NEGATIVE
Glucose, UA: NEGATIVE mg/dL
Hgb urine dipstick: NEGATIVE
KETONES UR: NEGATIVE mg/dL
Leukocytes, UA: NEGATIVE
NITRITE: NEGATIVE
PH: 7 (ref 5.0–8.0)
PROTEIN: NEGATIVE mg/dL
Specific Gravity, Urine: <1.005 — ABNORMAL LOW (ref 1.005–1.030)

## 2016-01-08 LAB — CBC
HCT: 40.1 % (ref 36.0–46.0)
Hemoglobin: 13.7 g/dL (ref 12.0–15.0)
MCH: 33.3 pg (ref 26.0–34.0)
MCHC: 34.2 g/dL (ref 30.0–36.0)
MCV: 97.6 fL (ref 78.0–100.0)
PLATELETS: 141 10*3/uL — AB (ref 150–400)
RBC: 4.11 MIL/uL (ref 3.87–5.11)
RDW: 12.1 % (ref 11.5–15.5)
WBC: 3.3 10*3/uL — ABNORMAL LOW (ref 4.0–10.5)

## 2016-01-08 LAB — LIPASE, BLOOD: Lipase: 35 U/L (ref 11–51)

## 2016-01-08 MED ORDER — TRAMADOL HCL 50 MG PO TABS: 50.0000 mg | ORAL_TABLET | Freq: Four times a day (QID) | ORAL | 0 refills | Status: DC | PRN

## 2016-01-08 MED ORDER — IOPAMIDOL (ISOVUE-300) INJECTION 61%
100.0000 mL | Freq: Once | INTRAVENOUS | Status: AC | PRN
  Administered 2016-01-08: 100 mL via INTRAVENOUS

## 2016-01-08 MED ORDER — IOPAMIDOL (ISOVUE-300) INJECTION 61%
INTRAVENOUS | Status: AC
  Administered 2016-01-08: 30 mL
  Filled 2016-01-08: qty 30

## 2016-01-08 MED ORDER — TRAMADOL HCL 50 MG PO TABS
50.0000 mg | ORAL_TABLET | Freq: Once | ORAL | Status: AC
  Administered 2016-01-08: 50 mg via ORAL
  Filled 2016-01-08: qty 1

## 2016-01-08 MED ORDER — SODIUM CHLORIDE 0.9 % IV BOLUS (SEPSIS)
500.0000 mL | Freq: Once | INTRAVENOUS | Status: AC
  Administered 2016-01-08: 500 mL via INTRAVENOUS

## 2016-01-08 NOTE — ED Provider Notes (Signed)
Emergency Department Provider Note   I have reviewed the triage vital signs and the nursing notes.   HISTORY  Chief Complaint Abdominal Pain   HPI Dominique Matthews is a 82 y.o. female with PMH of a-fib, CAD, CVA, Diverticulitis, GERD, HTN presents to the emergency department for evaluation of left lower quadrant pain for the last 2 weeks. Patient has had prior episodes of similar pain which were treated to diverticulitis. She states she had a brief hospitalization in July 2017. She went to her primary care physician 2 weeks ago with return of left lower quadrant pain and chills. Denies fever. No diarrhea or vomiting. No blood in the stool. She was started on Cipro and Flagyl at that time and took her last dose of antibiotic yesterday. She is concerned because she continues to have left lower quadrant pain and chills. She denies associated chest pain or difficulty breathing. No heart palpitations. She does note generalized weakness and fatigue. She is prescribed tramadol at home which she reports works reasonably well for her pain control. No obvious exacerbating or alleviating factors.  Past Medical History:  Diagnosis Date  . Atrial fibrillation (HCC)   . CAD (coronary artery disease)   . Cerebrovascular disease    Nonobstructive  . Chronic anxiety   . Chronic benign neutropenia (HCC)   . Diverticulitis   . Diverticulitis   . GERD (gastroesophageal reflux disease)   . History of supraventricular tachycardia   . Hypertension   . IBS (irritable bowel syndrome)   . Mixed dyslipidemia   . Obesity   . Osteoarthritis   . Palpitations     Patient Active Problem List   Diagnosis Date Noted  . Diverticulitis of colon 04/02/2014  . Dyslipidemia 11/16/2010  . ESSENTIAL HYPERTENSION, BENIGN 04/21/2009  . CAD 04/21/2009  . PALPITATIONS, HX OF 04/21/2009  . IRRITABLE BOWEL SYNDROME, HX OF 04/21/2009  . POSTSURG PERCUT TRANSLUMINAL COR ANGPLSTY STS 04/21/2009    Past Surgical  History:  Procedure Laterality Date  . APPENDECTOMY    . BACK SURGERY    . Bone desinty  11/2015  . cardiac stent in 1997     at [facility]  . CARPAL TUNNEL RELEASE     Right; Bilateral trigger thumb release surgery  . CHOLECYSTECTOMY    . CORONARY ANGIOPLASTY     stent  . ESOPHAGOGASTRODUODENOSCOPY N/A 05/17/2014   Procedure: ESOPHAGOGASTRODUODENOSCOPY (EGD);  Surgeon: Malissa Hippo, MD;  Location: [facility];  Service: Endoscopy;  Laterality: N/A;  730  . KNEE ARTHROSCOPY     Right  . THORACIC DISC SURGERY  11/03/2015   T 12    Current Outpatient Rx  . Order #: [id number] Class: Historical Med  . Order #: [id number] Class: Historical Med  . Order #: [id number] Class: Historical Med  . Order #: [id number] Class: Normal  . Order #: [id number] Class: Print  . Order #: [id number] Class: Print  . Order #: [id number] Class: Historical Med  . Order #: [id number] Class: Historical Med  . Order #: [id number] Class: Historical Med  . Order #: [id number] Class: Historical Med  . Order #: [id number] Class: Historical Med  . Order #: [id number] Class: Historical Med  . Order #: [id number] Class: Normal  . Order #: [id number] Class: Historical Med  . Order #: [id number] Class: Normal  . Order #: [id number] Class: Normal  . Order #: [id number] Class: Historical Med  . Order #: [id number] Class: Print  . Order #: [id number] Class: Print    Allergies Codeine and Morphine  Family History  Problem Relation Age of Onset  .  Coronary artery disease Other     Social History Social History  Substance Use Topics  . Smoking status: Never Smoker  . Smokeless tobacco: Never Used  . Alcohol use No    Review of Systems  Constitutional: No fever. Positive chills. Positive fatigue.  Eyes: No visual changes. ENT: No sore throat. Cardiovascular: Denies chest pain. Respiratory: Denies shortness of breath. Gastrointestinal: LLL abdominal pain. Positive nausea, no vomiting.  No diarrhea.  No constipation. Genitourinary:  Negative for dysuria. Musculoskeletal: Negative for back pain. Skin: Negative for rash. Neurological: Negative for headaches, focal weakness or numbness.  10-point ROS otherwise negative.  ____________________________________________   PHYSICAL EXAM:  VITAL SIGNS: ED Triage Vitals  Enc Vitals Group     BP 01/08/16 0749 136/67     Pulse Rate 01/08/16 0749 84     Resp 01/08/16 0749 16     Temp 01/08/16 0749 97.7 F (36.5 C)     Temp Source 01/08/16 0749 Oral     SpO2 01/08/16 0749 97 %     Weight 01/08/16 0749 200 lb (90.7 kg)     Height 01/08/16 0749 5\' 6"  (1.676 m)     Pain Score 01/08/16 0750 8   Constitutional: Alert and oriented. Well appearing and in no acute distress. Eyes: Conjunctivae are normal.  Head: Atraumatic. Nose: No congestion/rhinnorhea. Mouth/Throat: Mucous membranes are moist.  Oropharynx non-erythematous. Neck: No stridor.  Cardiovascular: Normal rate, regular rhythm. Good peripheral circulation. Grossly normal heart sounds.   Respiratory: Normal respiratory effort.  No retractions. Lungs CTAB. Gastrointestinal: Soft with mild LLQ tenderness to palpation. No distention.  Musculoskeletal: No lower extremity tenderness nor edema. No gross deformities of extremities. Neurologic:  Normal speech and language. No gross focal neurologic deficits are appreciated.  Skin:  Skin is warm, dry and intact. No rash noted.  ____________________________________________   LABS (all labs ordered are listed, but only abnormal results are displayed)  Labs Reviewed  COMPREHENSIVE METABOLIC PANEL - Abnormal; Notable for the following:       Result Value   Glucose, Bld 129 (*)    GFR calc non Af Amer 59 (*)    All other components within normal limits  CBC - Abnormal; Notable for the following:    WBC 3.3 (*)    Platelets 141 (*)    All other components within normal limits  URINALYSIS, ROUTINE W REFLEX MICROSCOPIC (NOT AT [facility]) - Abnormal; Notable for the following:     Specific Gravity, Urine <1.005 (*)    All other components within normal limits  LIPASE, BLOOD   ____________________________________________  EKG   EKG Interpretation  Date/Time:  Thursday January 08 2016 08:25:53 EDT Ventricular Rate:  70 PR Interval:    QRS Duration: 94 QT Interval:  393 QTC Calculation: 424 R Axis:   -21 Text Interpretation:  Sinus rhythm Borderline left axis deviation Low voltage, precordial leads No STEMI.  Confirmed by LONG MD, JOSHUA [id number] on 01/08/2016 8:41:50 AM       ____________________________________________  RADIOLOGY  Ct Abdomen Pelvis W Contrast  Result Date: 01/08/2016 CLINICAL DATA:  Abdominal pain for 2 weeks, treated for diverticulitis with antibiotics 2 weeks ago EXAM: CT ABDOMEN AND PELVIS WITH CONTRAST TECHNIQUE: Multidetector CT imaging of the abdomen and pelvis was performed using the standard protocol following bolus administration of intravenous contrast. CONTRAST:  1 ISOVUE-300 IOPAMIDOL (ISOVUE-300) INJECTION 61%, 13/04/2015 ISOVUE-300 IOPAMIDOL (ISOVUE-300) INJECTION 61% COMPARISON:  08/29/2015 FINDINGS: Lower chest: Or right lung base is unremarkable. Again noted large  left diaphragmatic hernia with herniation of the entire stomach in retrocardiac position. No evidence of gastric outlet obstruction. No evidence of gastric volvulus. Hepatobiliary: Status postcholecystectomy. Enhanced liver is unremarkable. Pancreas: Enhanced pancreas is unremarkable. Spleen: Enhanced spleen is unremarkable. Adrenals/Urinary Tract: No adrenal gland mass. Bilateral lobulated renal contour. Bilateral dilatation of extrarenal pelvis. No hydronephrosis or hydroureter. Delayed renal images shows bilateral renal symmetrical excretion. Bilateral visualized proximal ureter is unremarkable. Moderate distended urinary bladder. No bladder filling defects or thickening of bladder wall. Stomach/Bowel: No small bowel obstruction. No pericecal inflammation. The terminal  ileum is unremarkable. Status post appendectomy. Moderate stool noted in transverse colon and descending colon. Moderate stool noted within redundant sigmoid colon. Sigmoid colon diverticula are noted. There is no evidence of acute colitis or diverticulitis. No distal colonic obstruction. Moderate stool and gas noted within rectum. The rectum measures 5.2 cm in diameter. Vascular/Lymphatic: No aortic aneurysm. Atherosclerotic calcifications of abdominal aorta, bilateral renal artery origin, bilateral common iliac artery this and splenic artery are noted. No retroperitoneal or mesenteric adenopathy. Reproductive: The uterus is atrophic.  No pelvic mass is noted. Other: No ascites or free abdominal air. There is a right inguinal hernia containing fat measures 4 cm. Left inguinal hernia containing fat measures 4 cm. No evidence of acute complication. Musculoskeletal: Sagittal images of the spine shows diffuse osteopenia. Extensive degenerative changes thoracolumbar spine. There is prior vertebroplasty at T12 level. Stable mild compression deformity upper endplate of T10 and T9 vertebral bodies. IMPRESSION: 1. Again noted large left diaphragmatic hernia with herniation of entire stomach and duodenal bulb in left lower chest/ retrocardiac position. No evidence of gastric volvulus or gastric outlet obstruction. 2. No hydronephrosis or hydroureter. 3. Status postcholecystectomy. 4. Moderate colonic stool. Sigmoid colon diverticula are noted. Redundant sigmoid colon. No evidence of acute colitis or diverticulitis. 5. Osteopenia and degenerative changes thoracolumbar spine. Prior vertebroplasty at T12 level. Stable mild compression deformities upper endplate of T9 and T10 vertebral bodies. 6. Moderate distended urinary bladder. 7. No pericecal inflammation. Status post appendectomy. No small bowel obstruction. Electronically Signed   By: Natasha Mead M.D.   On: 01/08/2016 10:18     ____________________________________________   PROCEDURES  Procedure(s) performed:   Procedures  None ____________________________________________   INITIAL IMPRESSION / ASSESSMENT AND PLAN / ED COURSE  Pertinent labs & imaging results that were available during my care of the patient were reviewed by me and considered in my medical decision making (see chart for details).  Patient resents reverse her pertinent for evaluation of left lower quadrant pain. She has had prior ED visits for the same. Negative CT scan of the abdomen and pelvis in June 2017. She's been on Cipro and Flagyl for the past 2 weeks with no improvement in symptoms. She endorses generalized weakness, nausea, chills but denies fever. Abdomen is soft with mild left lower quadrant tenderness to palpation. No rebound or guarding. Plan for CT scan of the abdomen and pelvis along with repeat lab work and EKG. Extremely low suspicion for atypical ACS presentation. Plan to rule out diverticulitis versus abscess versus other intra-abdominal mass. Patient to benefit from outpatient colonoscopy with continued left lower quadrant pain. She is followed closely by her primary care physician.  10:39 AM Pain is improved with tramadol. Plan for discharge with tramadol prescription. Reviewed CT scan results with the patient. No evidence of acute diverticulitis or surrounding abscess. She will call her primary care physician this afternoon to arrange close follow-up and possible GI referral and discussion  regarding colonoscopy vs further outpatient evaluation. Discussed return precautions in detail.   At this time, I do not feel there is any life-threatening condition present. I have reviewed and discussed all results (EKG, imaging, lab, urine as appropriate), exam findings with patient. I have reviewed nursing notes and appropriate previous records.  I feel the patient is safe to be discharged home without further emergent workup.  Discussed usual and customary return precautions. Patient and family (if present) verbalize understanding and are comfortable with this plan.  Patient will follow-up with their primary care provider. If they do not have a primary care provider, information for follow-up has been provided to them. All questions have been answered.  ____________________________________________  FINAL CLINICAL IMPRESSION(S) / ED DIAGNOSES  Final diagnoses:  Left lower quadrant pain     MEDICATIONS GIVEN DURING THIS VISIT:  Medications  traMADol (ULTRAM) tablet 50 mg (50 mg Oral Given 01/08/16 0834)  sodium chloride 0.9 % bolus 500 mL (0 mLs Intravenous Stopped 01/08/16 0930)  iopamidol (ISOVUE-300) 61 % injection (30 mLs  Contrast Given 01/08/16 0915)  iopamidol (ISOVUE-300) 61 % injection 100 mL (100 mLs Intravenous Contrast Given 01/08/16 0947)     NEW OUTPATIENT MEDICATIONS STARTED DURING THIS VISIT:  New Prescriptions   TRAMADOL (ULTRAM) 50 MG TABLET    Take 1 tablet (50 mg total) by mouth every 6 (six) hours as needed.      Note:  This document was prepared using Dragon voice recognition software and may include unintentional dictation errors.  Alona Bene, MD Emergency Medicine   Maia Plan, MD 01/08/16 [id number]

## 2016-01-08 NOTE — ED Notes (Signed)
Completed oral contrast. 

## 2016-01-08 NOTE — Discharge Instructions (Signed)

## 2016-01-08 NOTE — ED Triage Notes (Signed)
Pt reports lower abd pain x2 weeks.  Pt was dx with diverticulitis 2 weeks ago and has been taking antibiotics since.  Pt denies diarrhea and emesis, but does report nausea.Pt reports hot flashes and cold chills.

## 2016-01-14 DIAGNOSIS — R42 Dizziness and giddiness: Secondary | ICD-10-CM | POA: Diagnosis not present

## 2016-01-14 DIAGNOSIS — M1990 Unspecified osteoarthritis, unspecified site: Secondary | ICD-10-CM | POA: Diagnosis not present

## 2016-01-14 DIAGNOSIS — I10 Essential (primary) hypertension: Secondary | ICD-10-CM | POA: Diagnosis not present

## 2016-01-14 DIAGNOSIS — M4854XA Collapsed vertebra, not elsewhere classified, thoracic region, initial encounter for fracture: Secondary | ICD-10-CM | POA: Diagnosis not present

## 2016-01-14 DIAGNOSIS — K219 Gastro-esophageal reflux disease without esophagitis: Secondary | ICD-10-CM | POA: Diagnosis not present

## 2016-01-14 DIAGNOSIS — Z0001 Encounter for general adult medical examination with abnormal findings: Secondary | ICD-10-CM | POA: Diagnosis not present

## 2016-01-14 DIAGNOSIS — M545 Low back pain: Secondary | ICD-10-CM | POA: Diagnosis not present

## 2016-01-14 DIAGNOSIS — Z6834 Body mass index (BMI) 34.0-34.9, adult: Secondary | ICD-10-CM | POA: Diagnosis not present

## 2016-02-23 DIAGNOSIS — S22000A Wedge compression fracture of unspecified thoracic vertebra, initial encounter for closed fracture: Secondary | ICD-10-CM | POA: Diagnosis not present

## 2016-03-29 ENCOUNTER — Ambulatory Visit (INDEPENDENT_AMBULATORY_CARE_PROVIDER_SITE_OTHER): Payer: Medicare Other | Admitting: Cardiovascular Disease

## 2016-03-29 ENCOUNTER — Encounter: Payer: Self-pay | Admitting: Cardiovascular Disease

## 2016-03-29 VITALS — BP 138/78 | HR 57 | Ht 63.0 in | Wt 198.6 lb

## 2016-03-29 DIAGNOSIS — R002 Palpitations: Secondary | ICD-10-CM | POA: Diagnosis not present

## 2016-03-29 DIAGNOSIS — I10 Essential (primary) hypertension: Secondary | ICD-10-CM | POA: Diagnosis not present

## 2016-03-29 DIAGNOSIS — I2510 Atherosclerotic heart disease of native coronary artery without angina pectoris: Secondary | ICD-10-CM

## 2016-03-29 NOTE — Progress Notes (Signed)
SUBJECTIVE: This is a pleasant 83 year old female who presents for an annual followup visit. She has a history of coronary artery disease status post stent placement in 1997. She also has intermittent palpitations controlled with metoprolol.  Her PCP stopped both aspirin and statin for fear of worsening stomach problems. She has a large hiatal hernia. She seldom has chest pain which she attributes to this hiatal hernia.  She underwent back surgery in August 2017 and takes tramadol and Tylenol for pain.   Review of Systems: As per "subjective", otherwise negative.  Allergies  Allergen Reactions  . Codeine     REACTION: sweating  . Morphine     REACTION: sweating    Current Outpatient Prescriptions  Medication Sig Dispense Refill  . acetaminophen (TYLENOL) 500 MG tablet Take 500 mg by mouth every 6 (six) hours as needed for moderate pain.    Marland Kitchen dicyclomine (BENTYL) 10 MG capsule Take 10 mg by mouth 4 (four) times daily -  before meals and at bedtime.    . gabapentin (NEURONTIN) 300 MG capsule Take 300 mg by mouth 3 (three) times daily.      Marland Kitchen lidocaine (LIDODERM) 5 % Place 2 patches onto the skin daily. Remove & Discard patch within 12 hours or as directed by MD    . lisinopril (PRINIVIL,ZESTRIL) 40 MG tablet Take 40 mg by mouth daily.      . metoprolol (TOPROL-XL) 50 MG 24 hr tablet Take 50 mg by mouth daily.     . nitroGLYCERIN (NITROSTAT) 0.4 MG SL tablet Place 1 tablet (0.4 mg total) under the tongue every 5 (five) minutes as needed for chest pain. 25 tablet 3  . ondansetron (ZOFRAN) 4 MG tablet TAKE ONE TABLET TWICE DAILY AS NEEDED 30 tablet 1  . pantoprazole (PROTONIX) 40 MG tablet Take 40 mg by mouth daily.    . traMADol (ULTRAM) 50 MG tablet Take 1 tablet (50 mg total) by mouth every 6 (six) hours as needed. 15 tablet 0   No current facility-administered medications for this visit.     Past Medical History:  Diagnosis Date  . Atrial fibrillation (HCC)   . CAD  (coronary artery disease)   . Cerebrovascular disease    Nonobstructive  . Chronic anxiety   . Chronic benign neutropenia (HCC)   . Diverticulitis   . Diverticulitis   . GERD (gastroesophageal reflux disease)   . History of supraventricular tachycardia   . Hypertension   . IBS (irritable bowel syndrome)   . Mixed dyslipidemia   . Obesity   . Osteoarthritis   . Palpitations     Past Surgical History:  Procedure Laterality Date  . APPENDECTOMY    . BACK SURGERY    . Bone desinty  11/2015  . cardiac stent in 1997     at [facility]  . CARPAL TUNNEL RELEASE     Right; Bilateral trigger thumb release surgery  . CHOLECYSTECTOMY    . CORONARY ANGIOPLASTY     stent  . ESOPHAGOGASTRODUODENOSCOPY N/A 05/17/2014   Procedure: ESOPHAGOGASTRODUODENOSCOPY (EGD);  Surgeon: Malissa Hippo, MD;  Location: [facility];  Service: Endoscopy;  Laterality: N/A;  730  . KNEE ARTHROSCOPY     Right  . THORACIC DISC SURGERY  11/03/2015   T 12    Social History   Social History  . Marital status: Married    Spouse name: N/A  . Number of children: N/A  . Years of education: N/A  Occupational History  . Not on file.   Social History Main Topics  . Smoking status: Never Smoker  . Smokeless tobacco: Never Used  . Alcohol use No  . Drug use: No  . Sexual activity: Not on file   Other Topics Concern  . Not on file   Social History Narrative  . No narrative on file     Vitals:   03/29/16 1248  BP: 138/78  Pulse: (!) 57  Weight: 198 lb 9.6 oz (90.1 kg)  Height: 5\' 3"  (1.6 m)    PHYSICAL EXAM General: NAD HEENT: Normal. Neck: No JVD, no thyromegaly. Lungs: Clear to auscultation bilaterally with normal respiratory effort. CV: Nondisplaced PMI.  Regular rate and rhythm, normal S1/S2, no S3/S4, no murmur. No pretibial or periankle edema.  No carotid bruit.   Abdomen: Soft, nontender, no distention.  Neurologic: Alert and oriented.  Psych: Normal affect. Skin:  Normal. Musculoskeletal: No gross deformities.    ECG: Most recent ECG reviewed.      ASSESSMENT AND PLAN: 1. CAD: Symptomatically stable. PCP stopped both aspirin and statin for fear of worsening stomach problems. Continue metoprolol.  2. Hyperlipidemia: No longer on statin (stopped by PCP).  3. Palpitations: Controlled with Toprol XL 50 mg daily. No changes.  4. Essential HTN: Controlled on lisinopril 40 mg daily. No changes.  Dispo: f/u 1 year.   , M.D., F.A.C.C.

## 2016-03-29 NOTE — Patient Instructions (Signed)

## 2017-04-04 ENCOUNTER — Ambulatory Visit (INDEPENDENT_AMBULATORY_CARE_PROVIDER_SITE_OTHER): Payer: Medicare Other | Admitting: Cardiovascular Disease

## 2017-04-04 ENCOUNTER — Other Ambulatory Visit: Payer: Self-pay

## 2017-04-04 ENCOUNTER — Encounter: Payer: Self-pay | Admitting: Cardiovascular Disease

## 2017-04-04 ENCOUNTER — Encounter: Payer: Self-pay | Admitting: *Deleted

## 2017-04-04 VITALS — BP 133/73 | HR 66 | Ht 65.0 in | Wt 193.0 lb

## 2017-04-04 DIAGNOSIS — R002 Palpitations: Secondary | ICD-10-CM | POA: Diagnosis not present

## 2017-04-04 DIAGNOSIS — I25118 Atherosclerotic heart disease of native coronary artery with other forms of angina pectoris: Secondary | ICD-10-CM

## 2017-04-04 DIAGNOSIS — Z955 Presence of coronary angioplasty implant and graft: Secondary | ICD-10-CM | POA: Diagnosis not present

## 2017-04-04 DIAGNOSIS — I10 Essential (primary) hypertension: Secondary | ICD-10-CM

## 2017-04-04 NOTE — Patient Instructions (Signed)

## 2017-04-04 NOTE — Progress Notes (Signed)
SUBJECTIVE: The patient presents for routine annual follow-up.  She has a history of coronary artery disease with stent placement in 1997.  She also has a history of occasional palpitations controlled with metoprolol.  Her aspirin and statin were previously discontinued by her PCP due to stomach issues.  She has a large hiatal hernia.  She denies chest pain and shortness of breath.  She seldom has palpitations.  She has not had to use nitroglycerin.  She wears compression stockings for leg swelling.  Primary complaints relate to chronic lower back pain.     Review of Systems: As per "subjective", otherwise negative.  Allergies  Allergen Reactions  . Codeine     REACTION: sweating  . Morphine     REACTION: sweating    Current Outpatient Medications  Medication Sig Dispense Refill  . acetaminophen (TYLENOL) 500 MG tablet Take 500 mg by mouth every 6 (six) hours as needed for moderate pain.    Marland Kitchen dicyclomine (BENTYL) 10 MG capsule Take 10 mg by mouth 4 (four) times daily -  before meals and at bedtime.    Marland Kitchen esomeprazole (NEXIUM) 20 MG capsule Take 20 mg by mouth daily at 12 noon.    . gabapentin (NEURONTIN) 300 MG capsule Take 300 mg by mouth 3 (three) times daily.      Marland Kitchen lisinopril (PRINIVIL,ZESTRIL) 40 MG tablet Take 40 mg by mouth daily.      . meclizine (ANTIVERT) 25 MG tablet Take 25 mg by mouth 3 (three) times daily as needed for dizziness.    . metoprolol (TOPROL-XL) 50 MG 24 hr tablet Take 50 mg by mouth daily.     . nitroGLYCERIN (NITROSTAT) 0.4 MG SL tablet Place 1 tablet (0.4 mg total) under the tongue every 5 (five) minutes as needed for chest pain. 25 tablet 3  . ondansetron (ZOFRAN) 4 MG tablet TAKE ONE TABLET TWICE DAILY AS NEEDED 30 tablet 1  . traMADol (ULTRAM) 50 MG tablet Take 1 tablet (50 mg total) by mouth every 6 (six) hours as needed. 15 tablet 0   No current facility-administered medications for this visit.     Past Medical History:  Diagnosis Date    . Atrial fibrillation (HCC)   . CAD (coronary artery disease)   . Cerebrovascular disease    Nonobstructive  . Chronic anxiety   . Chronic benign neutropenia (HCC)   . Diverticulitis   . Diverticulitis   . GERD (gastroesophageal reflux disease)   . History of supraventricular tachycardia   . Hypertension   . IBS (irritable bowel syndrome)   . Mixed dyslipidemia   . Obesity   . Osteoarthritis   . Palpitations     Past Surgical History:  Procedure Laterality Date  . APPENDECTOMY    . BACK SURGERY    . Bone desinty  11/2015  . cardiac stent in 1997     at [facility]  . CARPAL TUNNEL RELEASE     Right; Bilateral trigger thumb release surgery  . CHOLECYSTECTOMY    . CORONARY ANGIOPLASTY     stent  . ESOPHAGOGASTRODUODENOSCOPY N/A 05/17/2014   Procedure: ESOPHAGOGASTRODUODENOSCOPY (EGD);  Surgeon: Malissa Hippo, MD;  Location: [facility];  Service: Endoscopy;  Laterality: N/A;  730  . KNEE ARTHROSCOPY     Right  . THORACIC DISC SURGERY  11/03/2015   T 12    Social History   Socioeconomic History  . Marital status: Married    Spouse name: Not  on file  . Number of children: Not on file  . Years of education: Not on file  . Highest education level: Not on file  Social Needs  . Financial resource strain: Not on file  . Food insecurity - worry: Not on file  . Food insecurity - inability: Not on file  . Transportation needs - medical: Not on file  . Transportation needs - non-medical: Not on file  Occupational History  . Not on file  Tobacco Use  . Smoking status: Never Smoker  . Smokeless tobacco: Never Used  Substance and Sexual Activity  . Alcohol use: No    Alcohol/week: 0.0 oz  . Drug use: No  . Sexual activity: Not on file  Other Topics Concern  . Not on file  Social History Narrative  . Not on file     Vitals:   04/04/17 1123  BP: 133/73  Pulse: 66  SpO2: 94%  Weight: 193 lb (87.5 kg)  Height: 5\' 5"  (1.651 m)    Wt Readings from  Last 3 Encounters:  04/04/17 193 lb (87.5 kg)  03/29/16 198 lb 9.6 oz (90.1 kg)  01/08/16 200 lb (90.7 kg)     PHYSICAL EXAM General: NAD HEENT: Normal. Neck: No JVD, no thyromegaly. Lungs: Clear to auscultation bilaterally with normal respiratory effort. CV: Regular rate and rhythm, normal S1/S2, no S3/S4, no murmur. No pretibial or periankle edema.  No carotid bruit.   Abdomen: Soft, nontender, no distention.  Neurologic: Alert and oriented.  Psych: Normal affect. Skin: Normal. Musculoskeletal: No gross deformities.    ECG: Most recent ECG reviewed.   Labs: Lab Results  Component Value Date/Time   K 3.8 01/08/2016 08:11 AM   BUN 10 01/08/2016 08:11 AM   CREATININE 0.88 01/08/2016 08:11 AM   CREATININE 0.86 08/08/2014 09:51 AM   ALT 19 01/08/2016 08:11 AM   HGB 13.7 01/08/2016 08:11 AM     Lipids: No results found for: LDLCALC, LDLDIRECT, CHOL, TRIG, HDL     ASSESSMENT AND PLAN: 1.  Coronary artery disease with history of stent placement: Symptomatically stable.  Aspirin and statin previously stopped by her PCP due to stomach issues.  Continue metoprolol.  2.  Palpitations: Controlled on Toprol-XL 50 mg daily.  No changes.  3.  Hypertension: Controlled on present therapy.  No changes.    Disposition: Follow up 1 year.   13/04/2015, M.D., F.A.C.C.

## 2017-08-08 ENCOUNTER — Other Ambulatory Visit: Payer: Self-pay | Admitting: Cardiovascular Disease

## 2017-11-29 NOTE — Patient Instructions (Signed)
Your procedure is scheduled on: 12/09/2017  Report to [facility] at   615    AM.  Call this number if you have problems the morning of surgery: [phone]   Do not eat food or drink liquids :After Midnight.      Take these medicines the morning of surgery with A SIP OF WATER: bentyl ( if needed), nexium, neurontin, lisinopril, metoprolol, zofran ( if needed), ultram (if needed).   Do not wear jewelry, make-up or nail polish.  Do not wear lotions, powders, or perfumes. You may wear deodorant.  Do not shave 48 hours prior to surgery.  Do not bring valuables to the hospital.  Contacts, dentures or bridgework may not be worn into surgery.  Leave suitcase in the car. After surgery it may be brought to your room.  For patients admitted to the hospital, checkout time is 11:00 AM the day of discharge.   Patients discharged the day of surgery will not be allowed to drive home.  :     Please read over the following fact sheets that you were given: Coughing and Deep Breathing, Surgical Site Infection Prevention, Anesthesia Post-op Instructions and Care and Recovery After Surgery    Cataract A cataract is a clouding of the lens of the eye. When a lens becomes cloudy, vision is reduced based on the degree and nature of the clouding. Many cataracts reduce vision to some degree. Some cataracts make people more near-sighted as they develop. Other cataracts increase glare. Cataracts that are ignored and become worse can sometimes look white. The white color can be seen through the pupil. CAUSES   Aging. However, cataracts may occur at any age, even in newborns.   Certain drugs.   Trauma to the eye.   Certain diseases such as diabetes.   Specific eye diseases such as chronic inflammation inside the eye or a sudden attack of a rare form of glaucoma.   Inherited or acquired medical problems.  SYMPTOMS   Gradual, progressive drop in vision in the affected eye.   Severe, rapid visual loss. This  most often happens when trauma is the cause.  DIAGNOSIS  To detect a cataract, an eye doctor examines the lens. Cataracts are best diagnosed with an exam of the eyes with the pupils enlarged (dilated) by drops.  TREATMENT  For an early cataract, vision may improve by using different eyeglasses or stronger lighting. If that does not help your vision, surgery is the only effective treatment. A cataract needs to be surgically removed when vision loss interferes with your everyday activities, such as driving, reading, or watching TV. A cataract may also have to be removed if it prevents examination or treatment of another eye problem. Surgery removes the cloudy lens and usually replaces it with a substitute lens (intraocular lens, IOL).  At a time when both you and your doctor agree, the cataract will be surgically removed. If you have cataracts in both eyes, only one is usually removed at a time. This allows the operated eye to heal and be out of danger from any possible problems after surgery (such as infection or poor wound healing). In rare cases, a cataract may be doing damage to your eye. In these cases, your caregiver may advise surgical removal right away. The vast majority of people who have cataract surgery have better vision afterward. HOME CARE INSTRUCTIONS  If you are not planning surgery, you may be asked to do the following:  Use different eyeglasses.  Use stronger or brighter lighting.   Ask your eye doctor about reducing your medicine dose or changing medicines if it is thought that a medicine caused your cataract. Changing medicines does not make the cataract go away on its own.   Become familiar with your surroundings. Poor vision can lead to injury. Avoid bumping into things on the affected side. You are at a higher risk for tripping or falling.   Exercise extreme care when driving or operating machinery.   Wear sunglasses if you are sensitive to bright light or experiencing  problems with glare.  SEEK IMMEDIATE MEDICAL CARE IF:   You have a worsening or sudden vision loss.   You notice redness, swelling, or increasing pain in the eye.   You have a fever.  Document Released: 02/22/2005 Document Revised: 02/11/2011 Document Reviewed: 10/16/2010 [facility] Patient Information 2012 [place].PATIENT INSTRUCTIONS POST-ANESTHESIA  IMMEDIATELY FOLLOWING SURGERY:  Do not drive or operate machinery for the first twenty four hours after surgery.  Do not make any important decisions for twenty four hours after surgery or while taking narcotic pain medications or sedatives.  If you develop intractable nausea and vomiting or a severe headache please notify your doctor immediately.  FOLLOW-UP:  Please make an appointment with your surgeon as instructed. You do not need to follow up with anesthesia unless specifically instructed to do so.  WOUND CARE INSTRUCTIONS (if applicable):  Keep a dry clean dressing on the anesthesia/puncture wound site if there is drainage.  Once the wound has quit draining you may leave it open to air.  Generally you should leave the bandage intact for twenty four hours unless there is drainage.  If the epidural site drains for more than 36-48 hours please call the anesthesia department.  QUESTIONS?:  Please feel free to call your physician or the hospital operator if you have any questions, and they will be happy to assist you.

## 2017-12-01 ENCOUNTER — Encounter (HOSPITAL_COMMUNITY): Payer: Self-pay

## 2017-12-01 ENCOUNTER — Other Ambulatory Visit: Payer: Self-pay

## 2017-12-01 ENCOUNTER — Encounter (HOSPITAL_COMMUNITY)
Admission: RE | Admit: 2017-12-01 | Discharge: 2017-12-01 | Disposition: A | Discharge status: Home / Self Care | Payer: Medicare Other | Source: Ambulatory Visit | Attending: Ophthalmology | Admitting: Ophthalmology

## 2017-12-01 DIAGNOSIS — Z01818 Encounter for other preprocedural examination: Secondary | ICD-10-CM | POA: Diagnosis present

## 2017-12-01 HISTORY — DX: Cardiac arrhythmia, unspecified: I49.9

## 2017-12-01 HISTORY — DX: Nausea with vomiting, unspecified: R11.2

## 2017-12-01 HISTORY — DX: Nausea with vomiting, unspecified: Z98.890

## 2017-12-01 LAB — CBC WITH DIFFERENTIAL/PLATELET
BASOS ABS: 0 10*3/uL (ref 0.0–0.1)
BASOS PCT: 1 %
EOS PCT: 2 %
Eosinophils Absolute: 0.1 10*3/uL (ref 0.0–0.7)
HCT: 38.6 % (ref 36.0–46.0)
Hemoglobin: 12.9 g/dL (ref 12.0–15.0)
LYMPHS PCT: 27 %
Lymphs Abs: 1.1 10*3/uL (ref 0.7–4.0)
MCH: 33.1 pg (ref 26.0–34.0)
MCHC: 33.4 g/dL (ref 30.0–36.0)
MCV: 99 fL (ref 78.0–100.0)
MONO ABS: 0.4 10*3/uL (ref 0.1–1.0)
Monocytes Relative: 10 %
Neutro Abs: 2.4 10*3/uL (ref 1.7–7.7)
Neutrophils Relative %: 60 %
PLATELETS: 164 10*3/uL (ref 150–400)
RBC: 3.9 MIL/uL (ref 3.87–5.11)
RDW: 12.1 % (ref 11.5–15.5)
WBC: 4 10*3/uL (ref 4.0–10.5)

## 2017-12-01 LAB — BASIC METABOLIC PANEL
ANION GAP: 9 (ref 5–15)
BUN: 17 mg/dL (ref 8–23)
CALCIUM: 9.5 mg/dL (ref 8.9–10.3)
CO2: 27 mmol/L (ref 22–32)
Chloride: 101 mmol/L (ref 98–111)
Creatinine, Ser: 0.97 mg/dL (ref 0.44–1.00)
GFR calc non Af Amer: 51 mL/min — ABNORMAL LOW (ref >60)
GFR, EST AFRICAN AMERICAN: 60 mL/min — AB (ref >60)
Glucose, Bld: 110 mg/dL — ABNORMAL HIGH (ref 70–99)
POTASSIUM: 4.2 mmol/L (ref 3.5–5.1)
Sodium: 137 mmol/L (ref 135–145)

## 2017-12-08 MED ORDER — TETRACAINE HCL 0.5 % OP SOLN
OPHTHALMIC | Status: AC
  Filled 2017-12-08: qty 4

## 2017-12-08 MED ORDER — LIDOCAINE HCL 3.5 % OP GEL
OPHTHALMIC | Status: AC
  Filled 2017-12-08: qty 1

## 2017-12-08 MED ORDER — CYCLOPENTOLATE-PHENYLEPHRINE 0.2-1 % OP SOLN
OPHTHALMIC | Status: AC
  Filled 2017-12-08: qty 2

## 2017-12-08 MED ORDER — NEOMYCIN-POLYMYXIN-DEXAMETH 3.5-10000-0.1 OP SUSP
OPHTHALMIC | Status: AC
  Filled 2017-12-08: qty 5

## 2017-12-08 MED ORDER — LIDOCAINE HCL (PF) 1 % IJ SOLN
INTRAMUSCULAR | Status: AC
  Filled 2017-12-08: qty 2

## 2017-12-08 MED ORDER — PHENYLEPHRINE HCL 2.5 % OP SOLN
OPHTHALMIC | Status: AC
  Filled 2017-12-08: qty 15

## 2017-12-09 ENCOUNTER — Ambulatory Visit (HOSPITAL_COMMUNITY)
Admission: RE | Admit: 2017-12-09 | Discharge: 2017-12-09 | Disposition: A | Discharge status: Home / Self Care | Payer: Medicare Other | Source: Ambulatory Visit | Attending: Ophthalmology | Admitting: Ophthalmology

## 2017-12-09 ENCOUNTER — Encounter (HOSPITAL_COMMUNITY): Payer: Self-pay | Admitting: *Deleted

## 2017-12-09 ENCOUNTER — Encounter (HOSPITAL_COMMUNITY)
Admission: RE | Disposition: A | Discharge status: Home / Self Care | Payer: Self-pay | Source: Ambulatory Visit | Attending: Ophthalmology

## 2017-12-09 ENCOUNTER — Ambulatory Visit (HOSPITAL_COMMUNITY): Payer: Medicare Other | Admitting: Anesthesiology

## 2017-12-09 ENCOUNTER — Other Ambulatory Visit: Payer: Self-pay

## 2017-12-09 DIAGNOSIS — I2510 Atherosclerotic heart disease of native coronary artery without angina pectoris: Secondary | ICD-10-CM | POA: Diagnosis not present

## 2017-12-09 DIAGNOSIS — I4891 Unspecified atrial fibrillation: Secondary | ICD-10-CM | POA: Diagnosis not present

## 2017-12-09 DIAGNOSIS — H2181 Floppy iris syndrome: Secondary | ICD-10-CM | POA: Diagnosis not present

## 2017-12-09 DIAGNOSIS — K219 Gastro-esophageal reflux disease without esophagitis: Secondary | ICD-10-CM | POA: Diagnosis not present

## 2017-12-09 DIAGNOSIS — H269 Unspecified cataract: Secondary | ICD-10-CM | POA: Diagnosis present

## 2017-12-09 DIAGNOSIS — I10 Essential (primary) hypertension: Secondary | ICD-10-CM | POA: Diagnosis not present

## 2017-12-09 DIAGNOSIS — Z79899 Other long term (current) drug therapy: Secondary | ICD-10-CM | POA: Insufficient documentation

## 2017-12-09 DIAGNOSIS — H25812 Combined forms of age-related cataract, left eye: Secondary | ICD-10-CM | POA: Insufficient documentation

## 2017-12-09 DIAGNOSIS — K589 Irritable bowel syndrome without diarrhea: Secondary | ICD-10-CM | POA: Insufficient documentation

## 2017-12-09 DIAGNOSIS — Z885 Allergy status to narcotic agent status: Secondary | ICD-10-CM | POA: Diagnosis not present

## 2017-12-09 HISTORY — PX: CATARACT EXTRACTION W/PHACO: SHX586

## 2017-12-09 SURGERY — PHACOEMULSIFICATION, CATARACT, WITH IOL INSERTION
Anesthesia: Monitor Anesthesia Care | Site: Eye | Laterality: Left

## 2017-12-09 MED ORDER — BSS IO SOLN
INTRAOCULAR | Status: DC | PRN
  Administered 2017-12-09: 15 mL via INTRAOCULAR

## 2017-12-09 MED ORDER — PHENYLEPHRINE HCL 2.5 % OP SOLN
1.0000 [drp] | OPHTHALMIC | Status: AC
  Administered 2017-12-09 (×3): 1 [drp] via OPHTHALMIC

## 2017-12-09 MED ORDER — LIDOCAINE HCL (PF) 1 % IJ SOLN
INTRAOCULAR | Status: DC | PRN
  Administered 2017-12-09: 1 mL via OPHTHALMIC

## 2017-12-09 MED ORDER — MIDAZOLAM HCL 2 MG/2ML IJ SOLN
INTRAMUSCULAR | Status: AC
  Filled 2017-12-09: qty 2

## 2017-12-09 MED ORDER — NEOMYCIN-POLYMYXIN-DEXAMETH 3.5-10000-0.1 OP SUSP
OPHTHALMIC | Status: DC | PRN
  Administered 2017-12-09: 2 [drp] via OPHTHALMIC

## 2017-12-09 MED ORDER — SODIUM HYALURONATE 23 MG/ML IO SOLN
INTRAOCULAR | Status: DC | PRN
  Administered 2017-12-09: 0.6 mL via INTRAOCULAR

## 2017-12-09 MED ORDER — POVIDONE-IODINE 5 % OP SOLN
OPHTHALMIC | Status: DC | PRN
  Administered 2017-12-09: 1 via OPHTHALMIC

## 2017-12-09 MED ORDER — TETRACAINE HCL 0.5 % OP SOLN
1.0000 [drp] | OPHTHALMIC | Status: AC
  Administered 2017-12-09 (×3): 1 [drp] via OPHTHALMIC

## 2017-12-09 MED ORDER — PROVISC 10 MG/ML IO SOLN
INTRAOCULAR | Status: DC | PRN
  Administered 2017-12-09: 0.85 mL via INTRAOCULAR

## 2017-12-09 MED ORDER — LIDOCAINE HCL 3.5 % OP GEL: 1.0000 "application " | Freq: Once | OPHTHALMIC | Status: DC

## 2017-12-09 MED ORDER — EPINEPHRINE PF 1 MG/ML IJ SOLN
INTRAOCULAR | Status: DC | PRN
  Administered 2017-12-09: 500 mL

## 2017-12-09 MED ORDER — LACTATED RINGERS IV SOLN
INTRAVENOUS | Status: DC | PRN
  Administered 2017-12-09: 07:00:00 via INTRAVENOUS

## 2017-12-09 MED ORDER — MIDAZOLAM HCL 5 MG/5ML IJ SOLN
INTRAMUSCULAR | Status: DC | PRN
  Administered 2017-12-09 (×2): 1 mg via INTRAVENOUS

## 2017-12-09 MED ORDER — CYCLOPENTOLATE-PHENYLEPHRINE 0.2-1 % OP SOLN
1.0000 [drp] | OPHTHALMIC | Status: AC
  Administered 2017-12-09 (×3): 1 [drp] via OPHTHALMIC

## 2017-12-09 MED ORDER — EPINEPHRINE PF 1 MG/ML IJ SOLN
INTRAMUSCULAR | Status: AC
  Filled 2017-12-09: qty 1

## 2017-12-09 SURGICAL SUPPLY — 13 items
CLOTH BEACON ORANGE TIMEOUT ST (SAFETY) ×3 IMPLANT
EYE SHIELD UNIVERSAL CLEAR (GAUZE/BANDAGES/DRESSINGS) ×3 IMPLANT
GLOVE BIOGEL PI IND STRL 7.0 (GLOVE) ×2 IMPLANT
GLOVE BIOGEL PI INDICATOR 7.0 (GLOVE) ×4
LENS ALC ACRYL/TECN (Ophthalmic Related) ×3 IMPLANT
NEEDLE HYPO 18GX1.5 BLUNT FILL (NEEDLE) ×3 IMPLANT
PAD ARMBOARD 7.5X6 YLW CONV (MISCELLANEOUS) ×3 IMPLANT
RING MALYGIN (MISCELLANEOUS) ×3 IMPLANT
SYR TB 1ML LL NO SAFETY (SYRINGE) ×3 IMPLANT
TAPE SURG TRANSPORE 1 IN (GAUZE/BANDAGES/DRESSINGS) ×1 IMPLANT
TAPE SURGICAL TRANSPORE 1 IN (GAUZE/BANDAGES/DRESSINGS) ×2
VISCOELASTIC ADDITIONAL (OPHTHALMIC RELATED) ×3 IMPLANT
WATER STERILE IRR 250ML POUR (IV SOLUTION) ×3 IMPLANT

## 2017-12-09 NOTE — Transfer of Care (Signed)
Immediate Anesthesia Transfer of Care Note  Patient: Dominique Matthews  Procedure(s) Performed: CATARACT EXTRACTION PHACO AND INTRAOCULAR LENS PLACEMENT LEFT EYE (Left Eye)  Patient Location: Short Stay  Anesthesia Type:MAC  Level of Consciousness: awake, alert , oriented and patient cooperative  Airway & Oxygen Therapy: Patient Spontanous Breathing  Post-op Assessment: Report given to RN and Post -op Vital signs reviewed and stable  Post vital signs: Reviewed and stable  Last Vitals:  Vitals Value Taken Time  BP    Temp    Pulse    Resp    SpO2      Last Pain:  Vitals:   12/09/17 0635  TempSrc: Oral  PainSc: 3       Patients Stated Pain Goal: 8 (16/60/63 0160)  Complications: No apparent anesthesia complications

## 2017-12-09 NOTE — Anesthesia Procedure Notes (Signed)
Procedure Name: MAC Date/Time: 12/09/2017 7:28 AM Performed by: Andree Elk Amy A, CRNA Pre-anesthesia Checklist: Patient identified, Emergency Drugs available, Suction available, Patient being monitored and Timeout performed Oxygen Delivery Method: Nasal cannula

## 2017-12-09 NOTE — Discharge Instructions (Signed)
Please discharge patient when stable, will follow up today with Dr. Jenaro Souder at the [facility] office immediately following discharge.  Leave shield in place until visit.  All paperwork with discharge instructions will be given at the office. ° °

## 2017-12-09 NOTE — Anesthesia Preprocedure Evaluation (Signed)
Anesthesia Evaluation  Patient identified by MRN, date of birth, ID band Patient awake    Reviewed: Allergy & Precautions, H&P , NPO status , Patient's Chart, lab work & pertinent test results, reviewed documented beta blocker date and time   Airway Mallampati: II  TM Distance: >3 FB Neck ROM: full    Dental no notable dental hx.    Pulmonary neg pulmonary ROS,    Pulmonary exam normal breath sounds clear to auscultation       Cardiovascular Exercise Tolerance: Good hypertension, On Medications + CAD  negative cardio ROS  Atrial Fibrillation  Rhythm:regular Rate:Normal     Neuro/Psych negative neurological ROS  negative psych ROS   GI/Hepatic negative GI ROS, Neg liver ROS, GERD  ,  Endo/Other  negative endocrine ROS  Renal/GU negative Renal ROS  negative genitourinary   Musculoskeletal   Abdominal   Peds  Hematology negative hematology ROS (+)   Anesthesia Other Findings NSR 1 w h/o Afib  Reproductive/Obstetrics negative OB ROS                             Anesthesia Physical Anesthesia Plan  ASA: III  Anesthesia Plan: MAC   Post-op Pain Management:    Induction:   PONV Risk Score and Plan:   Airway Management Planned:   Additional Equipment:   Intra-op Plan:   Post-operative Plan:   Informed Consent: I have reviewed the patients History and Physical, chart, labs and discussed the procedure including the risks, benefits and alternatives for the proposed anesthesia with the patient or authorized representative who has indicated his/her understanding and acceptance.   Dental Advisory Given  Plan Discussed with: CRNA and Anesthesiologist  Anesthesia Plan Comments:         Anesthesia Quick Evaluation

## 2017-12-09 NOTE — Anesthesia Postprocedure Evaluation (Signed)
Anesthesia Post Note  Patient: Dominique Matthews  Procedure(s) Performed: CATARACT EXTRACTION PHACO AND INTRAOCULAR LENS PLACEMENT LEFT EYE (Left Eye)  Patient location during evaluation: Short Stay Anesthesia Type: MAC Level of consciousness: awake and alert and oriented Pain management: pain level controlled Vital Signs Assessment: post-procedure vital signs reviewed and stable Respiratory status: spontaneous breathing Cardiovascular status: stable Postop Assessment: no apparent nausea or vomiting Anesthetic complications: no     Last Vitals:  Vitals:   12/09/17 0635  BP: 130/74  Pulse: 68  Resp: 16  Temp: 36.8 C  SpO2: 92%    Last Pain:  Vitals:   12/09/17 0635  TempSrc: Oral  PainSc: 3                  ADAMS, AMY A

## 2017-12-09 NOTE — H&P (Signed)
The H and P was reviewed and updated. The patient was examined.  No changes were found after exam.  The surgical eye was marked.  

## 2017-12-09 NOTE — Op Note (Signed)
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 Dejae Bernet, MD, MA  Anesthesia: MAC, Topical Akten  Complications: None  Estimated Blood Loss: <56m (minimal)  Specimens: None  Implants: As above  Indications:  Visually significant cataract, Left Eye  Procedure:  The patient was seen and identified in the pre-operative area. The operative eye was identified and dilated.  The operative eye was marked.  Topical anesthesia was administered to the operative eye.     The patient was then to the operative suite and placed in the supine position.  A timeout was performed confirming the patient, procedure to be performed, and all other relevant information.   The patient's face was prepped and draped in the usual fashion for intra-ocular surgery.  A lid speculum was placed into the operative eye and the surgical microscope moved into place and focused.  Very poor dilation secondary to pseudoexfoliation was confirmed.  An inferotemporal paracentesis was created using a 20 gauge paracentesis blade.  Shugarcaine was injected into the anterior chamber.  Viscoelastic was injected into the anterior chamber.  A temporal clear-corneal main wound incision was created using a 2.416mmicrokeratome.  A Malyugin ring was placed.  A continuous curvilinear capsulorrhexis was initiated using an irrigating cystitome and completed using capsulorrhexis forceps.  Hydrodissection and hydrodeliniation were performed.  Viscoelastic was injected into the anterior chamber.  A  phacoemulsification handpiece and a chopper as a second instrument were used to remove the nucleus and epinucleus. The irrigation/aspiration handpiece was used to remove any remaining cortical material.   The capsular bag was reinflated with viscoelastic, checked, and found to be intact.  The intraocular lens was inserted into the capsular bag and dialed into place using a Kuglen hook. The Malyugin ring was removed.  The irrigation/aspiration handpiece was used to remove any remaining viscoelastic.  The clear corneal wound and paracentesis wounds were then hydrated and checked with Weck-Cels to be watertight.  The lid-speculum and drape was removed, and the patient's face was cleaned with a wet and dry 4x4.  Maxitrol was instilled in the eye before a clear shield was taped over the eye. The patient was taken to the post-operative care unit in good condition, having tolerated the procedure well.  Post-Op Instructions: [facility] and will receive all other orders and instructions.

## 2017-12-12 ENCOUNTER — Encounter (HOSPITAL_COMMUNITY): Payer: Self-pay | Admitting: Ophthalmology

## 2018-01-16 ENCOUNTER — Emergency Department (HOSPITAL_COMMUNITY): Payer: Medicare Other

## 2018-01-16 ENCOUNTER — Other Ambulatory Visit: Payer: Self-pay

## 2018-01-16 ENCOUNTER — Emergency Department (HOSPITAL_COMMUNITY)
Admission: EM | Admit: 2018-01-16 | Discharge: 2018-01-16 | Disposition: A | Discharge status: Home / Self Care | Payer: Medicare Other | Attending: Emergency Medicine | Admitting: Emergency Medicine

## 2018-01-16 ENCOUNTER — Encounter (HOSPITAL_COMMUNITY): Payer: Self-pay

## 2018-01-16 DIAGNOSIS — Z79899 Other long term (current) drug therapy: Secondary | ICD-10-CM | POA: Diagnosis not present

## 2018-01-16 DIAGNOSIS — M545 Low back pain: Secondary | ICD-10-CM | POA: Diagnosis present

## 2018-01-16 DIAGNOSIS — I259 Chronic ischemic heart disease, unspecified: Secondary | ICD-10-CM | POA: Diagnosis not present

## 2018-01-16 DIAGNOSIS — I10 Essential (primary) hypertension: Secondary | ICD-10-CM | POA: Diagnosis not present

## 2018-01-16 DIAGNOSIS — M5441 Lumbago with sciatica, right side: Secondary | ICD-10-CM | POA: Diagnosis not present

## 2018-01-16 LAB — CBC WITH DIFFERENTIAL/PLATELET
ABS IMMATURE GRANULOCYTES: 0.02 10*3/uL (ref 0.00–0.07)
Basophils Absolute: 0 10*3/uL (ref 0.0–0.1)
Basophils Relative: 0 %
EOS PCT: 1 %
Eosinophils Absolute: 0 10*3/uL (ref 0.0–0.5)
HEMATOCRIT: 37.8 % (ref 36.0–46.0)
HEMOGLOBIN: 12.5 g/dL (ref 12.0–15.0)
IMMATURE GRANULOCYTES: 0 %
LYMPHS ABS: 0.7 10*3/uL (ref 0.7–4.0)
LYMPHS PCT: 13 %
MCH: 32.4 pg (ref 26.0–34.0)
MCHC: 33.1 g/dL (ref 30.0–36.0)
MCV: 97.9 fL (ref 80.0–100.0)
Monocytes Absolute: 0.4 10*3/uL (ref 0.1–1.0)
Monocytes Relative: 8 %
NEUTROS ABS: 4.2 10*3/uL (ref 1.7–7.7)
Neutrophils Relative %: 78 %
Platelets: 154 10*3/uL (ref 150–400)
RBC: 3.86 MIL/uL — AB (ref 3.87–5.11)
RDW: 11.8 % (ref 11.5–15.5)
WBC: 5.4 10*3/uL (ref 4.0–10.5)
nRBC: 0 % (ref 0.0–0.2)

## 2018-01-16 LAB — BASIC METABOLIC PANEL
Anion gap: 8 (ref 5–15)
BUN: 13 mg/dL (ref 8–23)
CHLORIDE: 103 mmol/L (ref 98–111)
CO2: 27 mmol/L (ref 22–32)
CREATININE: 0.88 mg/dL (ref 0.44–1.00)
Calcium: 9.5 mg/dL (ref 8.9–10.3)
GFR calc non Af Amer: 58 mL/min — ABNORMAL LOW (ref >60)
Glucose, Bld: 109 mg/dL — ABNORMAL HIGH (ref 70–99)
POTASSIUM: 4.2 mmol/L (ref 3.5–5.1)
Sodium: 138 mmol/L (ref 135–145)

## 2018-01-16 LAB — URINALYSIS, ROUTINE W REFLEX MICROSCOPIC
BILIRUBIN URINE: NEGATIVE
Glucose, UA: NEGATIVE mg/dL
Hgb urine dipstick: NEGATIVE
Ketones, ur: NEGATIVE mg/dL
LEUKOCYTES UA: NEGATIVE
NITRITE: NEGATIVE
PH: 6 (ref 5.0–8.0)
Protein, ur: NEGATIVE mg/dL
Specific Gravity, Urine: 1.014 (ref 1.005–1.030)

## 2018-01-16 MED ORDER — HYDROCODONE-ACETAMINOPHEN 5-325 MG PO TABS
1.0000 | ORAL_TABLET | Freq: Once | ORAL | Status: AC
  Administered 2018-01-16: 1 via ORAL
  Filled 2018-01-16: qty 1

## 2018-01-16 MED ORDER — NAPROXEN 500 MG PO TABS: 500.0000 mg | ORAL_TABLET | Freq: Two times a day (BID) | ORAL | 0 refills | Status: DC

## 2018-01-16 MED ORDER — PREDNISONE 10 MG PO TABS: ORAL_TABLET | ORAL | 0 refills | Status: DC

## 2018-01-16 MED ORDER — ONDANSETRON HCL 4 MG/2ML IJ SOLN
4.0000 mg | Freq: Once | INTRAMUSCULAR | Status: AC
  Administered 2018-01-16: 4 mg via INTRAVENOUS
  Filled 2018-01-16: qty 2

## 2018-01-16 MED ORDER — ONDANSETRON HCL 4 MG PO TABS: 4.0000 mg | ORAL_TABLET | Freq: Four times a day (QID) | ORAL | 0 refills | Status: AC

## 2018-01-16 MED ORDER — KETOROLAC TROMETHAMINE 30 MG/ML IJ SOLN
30.0000 mg | Freq: Once | INTRAMUSCULAR | Status: AC
  Administered 2018-01-16: 30 mg via INTRAVENOUS
  Filled 2018-01-16: qty 1

## 2018-01-16 NOTE — ED Provider Notes (Signed)
[facility] EMERGENCY DEPARTMENT Provider Note   CSN: [id number] Arrival date & time: 01/16/18  8841     History   Chief Complaint Chief Complaint  Patient presents with  . Back Pain    HPI Dominique Matthews is a 84 y.o. female.  HPI   Dominique Matthews is a 84 y.o. female with past medical history of chronic back pain, hypertension and coronary artery disease.  Presents to the Emergency Department complaining of worsening of her lower back pain that began yesterday.  She describes increased pain to her lower right back that radiates into her buttocks and down her right leg to the level just below her knee.  She states pain is similar to previous, but more intense.  She has taken 50 mg tramadol this morning without relief.  She describes pain worse with attempted ambulation.  No recent injury.  No chest pain, mid back pain or shortness of breath.  She denies numbness or weakness of her lower extremities, abdominal pain, fever, chills, urine or bowel changes.  She states that she has seen neurosurgery for this in the past and was told she was not a surgical candidate.   Past Medical History:  Diagnosis Date  . Atrial fibrillation (HCC)   . CAD (coronary artery disease)   . Cerebrovascular disease    Nonobstructive  . Chronic anxiety   . Chronic benign neutropenia (HCC)   . Diverticulitis   . Diverticulitis   . Dysrhythmia    AFib  . GERD (gastroesophageal reflux disease)   . History of supraventricular tachycardia   . Hypertension   . IBS (irritable bowel syndrome)   . Mixed dyslipidemia   . Obesity   . Osteoarthritis   . Palpitations   . PONV (postoperative nausea and vomiting)     Patient Active Problem List   Diagnosis Date Noted  . Diverticulitis of colon 04/02/2014  . Dyslipidemia 11/16/2010  . ESSENTIAL HYPERTENSION, BENIGN 04/21/2009  . CAD 04/21/2009  . PALPITATIONS, HX OF 04/21/2009  . IRRITABLE BOWEL SYNDROME, HX OF 04/21/2009  . POSTSURG PERCUT  TRANSLUMINAL COR ANGPLSTY STS 04/21/2009    Past Surgical History:  Procedure Laterality Date  . APPENDECTOMY    . BACK SURGERY    . Bone desinty  11/2015  . cardiac stent in 1997     at [facility]  . CARPAL TUNNEL RELEASE     Right; Bilateral trigger thumb release surgery  . CATARACT EXTRACTION W/PHACO Left 12/09/2017   Procedure: CATARACT EXTRACTION PHACO AND INTRAOCULAR LENS PLACEMENT LEFT EYE;  Surgeon: Fabio Pierce, MD;  Location: [facility] ORS;  Service: Ophthalmology;  Laterality: Left;  CDE: 37.61  . CHOLECYSTECTOMY    . CORONARY ANGIOPLASTY     stent  . ESOPHAGOGASTRODUODENOSCOPY N/A 05/17/2014   Procedure: ESOPHAGOGASTRODUODENOSCOPY (EGD);  Surgeon: Malissa Hippo, MD;  Location: [facility] ENDO SUITE;  Service: Endoscopy;  Laterality: N/A;  730  . KNEE ARTHROSCOPY Left   . THORACIC DISC SURGERY  11/03/2015   T 12  . TOTAL KNEE ARTHROPLASTY Right      OB History   None      Home Medications    Prior to Admission medications   Medication Sig Start Date End Date Taking? Authorizing Provider  acetaminophen (TYLENOL) 500 MG tablet Take 500 mg by mouth every 6 (six) hours as needed for moderate pain or headache.     [provider]  dicyclomine (BENTYL) 10 MG capsule Take 20 mg by mouth 2 (two) times  daily.     [provider]  esomeprazole (NEXIUM) 20 MG capsule Take 20 mg by mouth daily.     [provider]  gabapentin (NEURONTIN) 300 MG capsule Take 300 mg by mouth 3 (three) times daily.      [provider]  lisinopril (PRINIVIL,ZESTRIL) 40 MG tablet Take 40 mg by mouth daily.      [provider]  metoprolol (TOPROL-XL) 50 MG 24 hr tablet Take 50 mg by mouth daily.     [provider]  nitroGLYCERIN (NITROSTAT) 0.4 MG SL tablet DISSOLVE 1 TAB UNDER TOUNGE FOR CHEST PAIN. MAY REPEAT EVERY 5 MINUTES FOR 3 DOSES. IF NO RELIEF CALL 911 OR GO TO ER Patient taking differently: Place 0.4 mg under the tongue every 5 (five)  minutes as needed for chest pain.  08/08/17   Laqueta Linden, MD  ondansetron (ZOFRAN) 4 MG tablet TAKE ONE TABLET TWICE DAILY AS NEEDED Patient taking differently: Take 4 mg by mouth 2 (two) times daily as needed for nausea or vomiting.  12/11/14   Rehman, Joline Maxcy, MD  traMADol (ULTRAM) 50 MG tablet Take 1 tablet (50 mg total) by mouth every 6 (six) hours as needed. Patient taking differently: Take 50 mg by mouth 2 (two) times daily.  01/08/16   Long, Arlyss Repress, MD    Family History Family History  Problem Relation Age of Onset  . Coronary artery disease Other     Social History Social History   Tobacco Use  . Smoking status: Never Smoker  . Smokeless tobacco: Never Used  Substance Use Topics  . Alcohol use: No    Alcohol/week: 0.0 standard drinks  . Drug use: No     Allergies   Codeine and Morphine   Review of Systems Review of Systems  Constitutional: Negative for fever.  Respiratory: Negative for chest tightness and shortness of breath.   Cardiovascular: Negative for chest pain and leg swelling.  Gastrointestinal: Negative for abdominal pain, constipation and vomiting.  Genitourinary: Negative for decreased urine volume, difficulty urinating, dysuria, flank pain and hematuria.  Musculoskeletal: Positive for back pain. Negative for joint swelling.  Skin: Negative for rash.  Neurological: Negative for dizziness, weakness and numbness.     Physical Exam Updated Vital Signs BP (!) 143/75 (BP Location: Left Arm)   Pulse 72   Temp 98.5 F (36.9 C) (Oral)   Resp 18   Wt 83.5 kg   SpO2 96%   BMI 30.63 kg/m   Physical Exam  Constitutional: She is oriented to person, place, and time. She appears well-developed and well-nourished. No distress.  HENT:  Head: Normocephalic and atraumatic.  Neck: Normal range of motion. Neck supple.  Cardiovascular: Normal rate, regular rhythm and intact distal pulses.  DP pulses are strong and palpable bilaterally    Pulmonary/Chest: Effort normal and breath sounds normal. No respiratory distress.  Abdominal: Soft. She exhibits no distension. There is no tenderness. There is no guarding.  Musculoskeletal: She exhibits tenderness. She exhibits no edema.       Lumbar back: She exhibits tenderness and pain. She exhibits normal range of motion, no swelling, no deformity, no laceration and normal pulse.  ttp of the lower lumbar spine and right paraspinal muscles and SI joint.  Positive SLR on right at 20 degrees.  Pt has 5/5 strength against resistance of bilateral lower extremities.     Neurological: She is alert and oriented to person, place, and time. She has normal strength. No  sensory deficit. She exhibits normal muscle tone. Coordination and gait normal.  Reflex Scores:      Patellar reflexes are 2+ on the right side and 2+ on the left side.      Achilles reflexes are 2+ on the right side and 2+ on the left side. Skin: Skin is warm and dry. Capillary refill takes less than 2 seconds. No rash noted.  Nursing note and vitals reviewed.    ED Treatments / Results  Labs (all labs ordered are listed, but only abnormal results are displayed) Labs Reviewed  BASIC METABOLIC PANEL - Abnormal; Notable for the following components:      Result Value   Glucose, Bld 109 (*)    GFR calc non Af Amer 58 (*)    All other components within normal limits  CBC WITH DIFFERENTIAL/PLATELET - Abnormal; Notable for the following components:   RBC 3.86 (*)    All other components within normal limits  URINALYSIS, ROUTINE W REFLEX MICROSCOPIC - Abnormal; Notable for the following components:   APPearance HAZY (*)    All other components within normal limits    EKG None  Radiology Dg Lumbar Spine Complete  Result Date: 01/16/2018 CLINICAL DATA:  Acute low back pain for 1 day.  Initial encounter. EXAM: LUMBAR SPINE - COMPLETE 4+ VIEW COMPARISON:  01/08/2016 CT and prior studies FINDINGS: A mild apex LEFT lumbar  scoliosis is again noted. Remote compression fracture and vertebral augmentation changes of T12 again identified. No acute fracture or subluxation identified. Mild multilevel degenerative disc disease/spondylosis and mild to moderate facet arthropathy again identified. No focal bony lesions or spondylolysis noted. Aortic atherosclerotic calcifications noted. IMPRESSION: 1. No acute abnormality. 2. Remote T12 compression fracture with vertebral augmentation changes. 3. Multilevel degenerative changes again noted 4.  Aortic Atherosclerosis (ICD10-I70.0). Electronically Signed   By: Harmon Pier M.D.   On: 01/16/2018 09:58     Procedures Procedures (including critical care time)  Medications Ordered in ED Medications  ketorolac (TORADOL) 30 MG/ML injection 30 mg (has no administration in time range)  ondansetron (ZOFRAN) injection 4 mg (has no administration in time range)     Initial Impression / Assessment and Plan / ED Course  I have reviewed the triage vital signs and the nursing notes.  Pertinent labs & imaging results that were available during my care of the patient were reviewed by me and considered in my medical decision making (see chart for details).     Patient with likely acute on chronic low back pain and sciatica.  No reported injury.  Neurovascularly intact.  Work-up today reassuring.  No concerning symptoms for emergent neurological process.  Patient is feeling better after medication given in the ED.  I feel that she is appropriate for discharge home, she agrees to treatment plan and close outpatient follow-up.  Patient is also been seen by Dr. Lynelle Doctor and care plan discussed.  Return precautions given.   Final Clinical Impressions(s) / ED Diagnoses   Final diagnoses:  Acute right-sided low back pain with right-sided sciatica    ED Discharge Orders    None       Pauline Aus, PA-C 01/18/18 1321    Linwood Dibbles, MD 01/23/18 [phone]

## 2018-01-16 NOTE — ED Triage Notes (Signed)
Pt reports that her lower back began hurting yesterday. And very painful to ambulate. No injury. Pt has hx of chronic back pain. Pt reports taking Tramadol 50 mg today at 0700

## 2018-01-16 NOTE — Discharge Instructions (Addendum)
Alternate ice and heat to your back.  Take the medication as directed.  Follow-up with your doctor for recheck.  Return to ER for any worsening

## 2018-02-04 ENCOUNTER — Emergency Department (HOSPITAL_COMMUNITY): Payer: Medicare Other

## 2018-02-04 ENCOUNTER — Other Ambulatory Visit: Payer: Self-pay

## 2018-02-04 ENCOUNTER — Emergency Department (HOSPITAL_COMMUNITY)
Admission: EM | Admit: 2018-02-04 | Discharge: 2018-02-04 | Disposition: A | Discharge status: Home / Self Care | Payer: Medicare Other | Attending: Emergency Medicine | Admitting: Emergency Medicine

## 2018-02-04 ENCOUNTER — Encounter (HOSPITAL_COMMUNITY): Payer: Self-pay | Admitting: Emergency Medicine

## 2018-02-04 DIAGNOSIS — Z79899 Other long term (current) drug therapy: Secondary | ICD-10-CM | POA: Diagnosis not present

## 2018-02-04 DIAGNOSIS — R079 Chest pain, unspecified: Secondary | ICD-10-CM | POA: Diagnosis not present

## 2018-02-04 DIAGNOSIS — R0789 Other chest pain: Secondary | ICD-10-CM

## 2018-02-04 LAB — CBC WITH DIFFERENTIAL/PLATELET
Abs Immature Granulocytes: 0.01 10*3/uL (ref 0.00–0.07)
Basophils Absolute: 0 10*3/uL (ref 0.0–0.1)
Basophils Relative: 1 %
EOS PCT: 2 %
Eosinophils Absolute: 0.1 10*3/uL (ref 0.0–0.5)
HEMATOCRIT: 36.6 % (ref 36.0–46.0)
HEMOGLOBIN: 11.9 g/dL — AB (ref 12.0–15.0)
Immature Granulocytes: 0 %
LYMPHS ABS: 0.7 10*3/uL (ref 0.7–4.0)
LYMPHS PCT: 17 %
MCH: 32.1 pg (ref 26.0–34.0)
MCHC: 32.5 g/dL (ref 30.0–36.0)
MCV: 98.7 fL (ref 80.0–100.0)
MONO ABS: 0.4 10*3/uL (ref 0.1–1.0)
Monocytes Relative: 10 %
Neutro Abs: 2.8 10*3/uL (ref 1.7–7.7)
Neutrophils Relative %: 70 %
Platelets: 158 10*3/uL (ref 150–400)
RBC: 3.71 MIL/uL — ABNORMAL LOW (ref 3.87–5.11)
RDW: 11.9 % (ref 11.5–15.5)
WBC: 3.9 10*3/uL — ABNORMAL LOW (ref 4.0–10.5)
nRBC: 0 % (ref 0.0–0.2)

## 2018-02-04 LAB — APTT: aPTT: 25 seconds (ref 24–36)

## 2018-02-04 LAB — COMPREHENSIVE METABOLIC PANEL
ALBUMIN: 4 g/dL (ref 3.5–5.0)
ALK PHOS: 53 U/L (ref 38–126)
ALT: 17 U/L (ref 0–44)
AST: 19 U/L (ref 15–41)
Anion gap: 10 (ref 5–15)
BILIRUBIN TOTAL: 1.4 mg/dL — AB (ref 0.3–1.2)
BUN: 15 mg/dL (ref 8–23)
CALCIUM: 9.5 mg/dL (ref 8.9–10.3)
CO2: 27 mmol/L (ref 22–32)
CREATININE: 0.89 mg/dL (ref 0.44–1.00)
Chloride: 98 mmol/L (ref 98–111)
GFR calc non Af Amer: 59 mL/min — ABNORMAL LOW (ref >60)
GLUCOSE: 123 mg/dL — AB (ref 70–99)
Potassium: 3.7 mmol/L (ref 3.5–5.1)
SODIUM: 135 mmol/L (ref 135–145)
Total Protein: 7.1 g/dL (ref 6.5–8.1)

## 2018-02-04 LAB — TROPONIN I
Troponin I: <0.03 ng/mL (ref <0.03)
Troponin I: <0.03 ng/mL (ref <0.03)

## 2018-02-04 LAB — PROTIME-INR
INR: 1
Prothrombin Time: 13.1 seconds (ref 11.4–15.2)

## 2018-02-04 MED ORDER — HYDROCODONE-ACETAMINOPHEN 5-325 MG PO TABS
1.0000 | ORAL_TABLET | Freq: Once | ORAL | Status: AC
  Administered 2018-02-04: 1 via ORAL
  Filled 2018-02-04: qty 1

## 2018-02-04 NOTE — ED Triage Notes (Signed)
Patient states she was having chest pain around 3 am and called EMS. Patient refused EMS transport. Patient took 324 aspirin at home along with 3 nitro. Patient states no chest pain at this time. Patient states back pain at this time.

## 2018-02-04 NOTE — ED Provider Notes (Signed)
[facility] EMERGENCY DEPARTMENT Provider Note   CSN: [id number] Arrival date & time: 02/04/18  0458  Time seen 5:07 AM   History   Chief Complaint Chief Complaint  Patient presents with  . Chest Pain    HPI Dominique Matthews is a 84 y.o. female.  HPI patient states she woke up from sleep about 3 AM with pain in the center of her chest.  She states it does not radiate.  She denies any nausea or vomiting.  She states she did feel short of breath but that is gone now.  She was diaphoretic.  She states she had this pain before in 1997 when she needed her cardiac stent.  She states her pain at its worst tonight was a 10 and currently it is a 0.  She states estimates her pain lasted about an hour.  She took 3 nitroglycerin and EMS told her to take 4 baby aspirin which she did.  EMS was called and evaluated her however she had her family bring her to the ED.  She states she has been getting chest pains off and on all the time for years.  She states she does not always tell Dr. Purvis Sheffield, her cardiologist about it.  She is usually sees him in January and last saw him in January 2019.  She states she has a history of atrial fibrillation however she is usually in sinus rhythm.  She states she was on a blood thinner that she cannot recall but has been off of it for period of time.  She does not recall if the cardiologist told her to stop it.  She states now she just feels tired.  PCP Selinda Flavin, MD   Past Medical History:  Diagnosis Date  . Atrial fibrillation (HCC)   . CAD (coronary artery disease)   . Cerebrovascular disease    Nonobstructive  . Chronic anxiety   . Chronic benign neutropenia (HCC)   . Diverticulitis   . Diverticulitis   . Dysrhythmia    AFib  . GERD (gastroesophageal reflux disease)   . History of supraventricular tachycardia   . Hypertension   . IBS (irritable bowel syndrome)   . Mixed dyslipidemia   . Obesity   . Osteoarthritis   . Palpitations   . PONV  (postoperative nausea and vomiting)     Patient Active Problem List   Diagnosis Date Noted  . Diverticulitis of colon 04/02/2014  . Dyslipidemia 11/16/2010  . ESSENTIAL HYPERTENSION, BENIGN 04/21/2009  . CAD 04/21/2009  . PALPITATIONS, HX OF 04/21/2009  . IRRITABLE BOWEL SYNDROME, HX OF 04/21/2009  . POSTSURG PERCUT TRANSLUMINAL COR ANGPLSTY STS 04/21/2009    Past Surgical History:  Procedure Laterality Date  . APPENDECTOMY    . BACK SURGERY    . Bone desinty  11/2015  . cardiac stent in 1997     at [facility]  . CARPAL TUNNEL RELEASE     Right; Bilateral trigger thumb release surgery  . CATARACT EXTRACTION W/PHACO Left 12/09/2017   Procedure: CATARACT EXTRACTION PHACO AND INTRAOCULAR LENS PLACEMENT LEFT EYE;  Surgeon: Fabio Pierce, MD;  Location: [facility] ORS;  Service: Ophthalmology;  Laterality: Left;  CDE: 37.61  . CHOLECYSTECTOMY    . CORONARY ANGIOPLASTY     stent  . ESOPHAGOGASTRODUODENOSCOPY N/A 05/17/2014   Procedure: ESOPHAGOGASTRODUODENOSCOPY (EGD);  Surgeon: Malissa Hippo, MD;  Location: [facility] END[facility] SU[facility];  Service: Endoscopy;  Laterality: N/A;  730  . KNEE ARTHROSCOPY Left   . THORACIC DISC  SURGERY  11/03/2015   T 12  . TOTAL KNEE ARTHROPLASTY Right      OB History   None      Home Medications    Prior to Admission medications   Medication Sig Start Date End Date Taking? Authorizing Provider  acetaminophen (TYLENOL) 500 MG tablet Take 500 mg by mouth every 6 (six) hours as needed for moderate pain or headache.     [provider]  dicyclomine (BENTYL) 10 MG capsule Take 20 mg by mouth 2 (two) times daily.     [provider]  esomeprazole (NEXIUM) 20 MG capsule Take 20 mg by mouth daily.     [provider]  gabapentin (NEURONTIN) 300 MG capsule Take 300 mg by mouth 3 (three) times daily.      [provider]  lisinopril (PRINIVIL,ZESTRIL) 40 MG tablet Take 40 mg by mouth daily.      [provider]  metoprolol  (TOPROL-XL) 50 MG 24 hr tablet Take 50 mg by mouth daily.     [provider]  naproxen (NAPROSYN) 500 MG tablet Take 1 tablet (500 mg total) by mouth 2 (two) times daily with a meal. 01/16/18   Triplett, Tammy, PA-C  nitroGLYCERIN (NITROSTAT) 0.4 MG SL tablet DISSOLVE 1 TAB UNDER TOUNGE FOR CHEST PAIN. MAY REPEAT EVERY 5 MINUTES FOR 3 DOSES. IF NO RELIEF CALL 911 OR GO TO ER Patient taking differently: Place 0.4 mg under the tongue every 5 (five) minutes as needed for chest pain.  08/08/17   Laqueta Linden, MD  ondansetron (ZOFRAN) 4 MG tablet Take 1 tablet (4 mg total) by mouth every 6 (six) hours. 01/16/18   Triplett, Tammy, PA-C  predniSONE (DELTASONE) 10 MG tablet Take 3 tablets po qd x 2 days, then 2 tablets po qd x 2 days, then 1 tablet po qd x 2 days 01/16/18   Triplett, Tammy, PA-C  traMADol (ULTRAM) 50 MG tablet Take 1 tablet (50 mg total) by mouth every 6 (six) hours as needed. Patient taking differently: Take 50 mg by mouth 2 (two) times daily.  01/08/16   Long, Arlyss Repress, MD    Family History Family History  Problem Relation Age of Onset  . Coronary artery disease Other     Social History Social History   Tobacco Use  . Smoking status: Never Smoker  . Smokeless tobacco: Never Used  Substance Use Topics  . Alcohol use: No    Alcohol/week: 0.0 standard drinks  . Drug use: No  lives at home Lives with spouse   Allergies   Codeine and Morphine   Review of Systems Review of Systems  All other systems reviewed and are negative.    Physical Exam Updated Vital Signs BP 127/66 (BP Location: Right Arm)   Pulse 76   Temp 98.7 F (37.1 C) (Oral)   Resp 10   Ht 5\' 5"  (1.651 m)   Wt 81.6 kg   SpO2 94%   BMI 29.95 kg/m   Vital signs normal    Physical Exam  Constitutional: She is oriented to person, place, and time. She appears well-developed and well-nourished.  Non-toxic appearance. She does not appear ill. No distress.  HENT:  Head: Normocephalic  and atraumatic.  Right Ear: External ear normal.  Left Ear: External ear normal.  Nose: Nose normal. No mucosal edema or rhinorrhea.  Mouth/Throat: Oropharynx is clear and moist and mucous membranes are normal. No dental abscesses or uvula swelling.  Eyes: Pupils are  equal, round, and reactive to light. Conjunctivae and EOM are normal.  Neck: Normal range of motion and full passive range of motion without pain. Neck supple.  Cardiovascular: Normal rate, regular rhythm and normal heart sounds. Exam reveals no gallop and no friction rub.  No murmur heard. Pulmonary/Chest: Effort normal and breath sounds normal. No respiratory distress. She has no wheezes. She has no rhonchi. She has no rales. She exhibits no tenderness and no crepitus.  Area of pain noted    Abdominal: Soft. Normal appearance and bowel sounds are normal. She exhibits no distension. There is no tenderness. There is no rebound and no guarding.  Musculoskeletal: Normal range of motion. She exhibits no edema or tenderness.  Moves all extremities well.   Neurological: She is alert and oriented to person, place, and time. She has normal strength. No cranial nerve deficit.  Skin: Skin is warm, dry and intact. No rash noted. No erythema. No pallor.  Psychiatric: She has a normal mood and affect. Her speech is normal and behavior is normal. Her mood appears not anxious.  Nursing note and vitals reviewed.    ED Treatments / Results  Labs (all labs ordered are listed, but only abnormal results are displayed) Results for orders placed or performed during the hospital encounter of 02/04/18  Comprehensive metabolic panel  Result Value Ref Range   Sodium 135 135 - 145 mmol/L   Potassium 3.7 3.5 - 5.1 mmol/L   Chloride 98 98 - 111 mmol/L   CO2 27 22 - 32 mmol/L   Glucose, Bld 123 (H) 70 - 99 mg/dL   BUN 15 8 - 23 mg/dL   Creatinine, Ser 4.80 0.44 - 1.00 mg/dL   Calcium 9.5 8.9 - 16.5 mg/dL   Total Protein 7.1 6.5 - 8.1 g/dL    Albumin 4.0 3.5 - 5.0 g/dL   AST 19 15 - 41 U/L   ALT 17 0 - 44 U/L   Alkaline Phosphatase 53 38 - 126 U/L   Total Bilirubin 1.4 (H) 0.3 - 1.2 mg/dL   GFR calc non Af Amer 59 (L) >60 mL/min   GFR calc Af Amer >60 >60 mL/min   Anion gap 10 5 - 15  Troponin I - Once  Result Value Ref Range   Troponin I <0.03 <0.03 ng/mL  CBC with Differential  Result Value Ref Range   WBC 3.9 (L) 4.0 - 10.5 K/uL   RBC 3.71 (L) 3.87 - 5.11 MIL/uL   Hemoglobin 11.9 (L) 12.0 - 15.0 g/dL   HCT 53.7 48.2 - 70.7 %   MCV 98.7 80.0 - 100.0 fL   MCH 32.1 26.0 - 34.0 pg   MCHC 32.5 30.0 - 36.0 g/dL   RDW 86.7 54.4 - 92.0 %   Platelets 158 150 - 400 K/uL   nRBC 0.0 0.0 - 0.2 %   Neutrophils Relative % 70 %   Neutro Abs 2.8 1.7 - 7.7 K/uL   Lymphocytes Relative 17 %   Lymphs Abs 0.7 0.7 - 4.0 K/uL   Monocytes Relative 10 %   Monocytes Absolute 0.4 0.1 - 1.0 K/uL   Eosinophils Relative 2 %   Eosinophils Absolute 0.1 0.0 - 0.5 K/uL   Basophils Relative 1 %   Basophils Absolute 0.0 0.0 - 0.1 K/uL   Immature Granulocytes 0 %   Abs Immature Granulocytes 0.01 0.00 - 0.07 K/uL  Protime-INR  Result Value Ref Range   Prothrombin Time 13.1 11.4 - 15.2 seconds   INR  1.00   APTT  Result Value Ref Range   aPTT 25 24 - 36 seconds   Laboratory interpretation all normal except mild anemia    EKG EKG Interpretation  Date/Time:  Saturday February 04 2018 05:05:59 EST Ventricular Rate:  89 PR Interval:    QRS Duration: 103 QT Interval:  357 QTC Calculation: 435 R Axis:   -9 Text Interpretation:  Normal sinus rhythm Electrode noise Since last tracing 01 Dec 2017 T waves more prominent in lateral chest leads Confirmed by Devoria Albe ([id number]) on 02/04/2018 5:59:35 AM   Radiology Dg Chest Port 1 View  Result Date: 02/04/2018 CLINICAL DATA:  Initial evaluation for acute chest pain. EXAM: PORTABLE CHEST 1 VIEW COMPARISON:  Prior radiograph from 08/29/2015. FINDINGS: Cardiomegaly, unchanged. Mediastinal  silhouette within normal limits. Aortic atherosclerosis. Lungs hypoinflated with chronic elevation of the left hemidiaphragm, similar to previous. Associated left basilar atelectasis. No other focal airspace disease. Mild perihilar vascular congestion without overt pulmonary edema. No definite pleural effusion. No pneumothorax. No acute osseous abnormality. IMPRESSION: 1. Chronic elevation of the left hemidiaphragm with associated left basilar atelectasis, similar to previous. 2. Stable cardiomegaly with mild perihilar vascular congestion without overt pulmonary edema. 3. Aortic atherosclerosis. Electronically Signed   By: Rise Mu M.D.   On: 02/04/2018 06:02    Procedures Procedures (including critical care time)  Medications Ordered in ED Medications  HYDROcodone-acetaminophen (NORCO/VICODIN) 5-325 MG per tablet 1 tablet (1 tablet Oral Given 02/04/18 0704)     Initial Impression / Assessment and Plan / ED Course  I have reviewed the triage vital signs and the nursing notes.  Pertinent labs & imaging results that were available during my care of the patient were reviewed by me and considered in my medical decision making (see chart for details).     Patient was pain-free when she arrived to the ED.  Laboratory testing was done.  Recheck at 6:55 AM patient remains chest pain-free.  Now she is complaining of her usual chronic back pain.  She was given her home pain medication, hydrocodone.  I discussed with her that we need to do a second troponin which will be around 830.  That will be about 5 and half hours after she had her pain start.  If that is negative she will probably be able to be discharged home to follow-up with Dr. Purvis Sheffield.  If its positive she would need admission for further evaluation by the cardiologist.  Signed out to Dr Estell Harpin at 07:45 AM to get the results of her delta troponin and to determine her disposition.   Final Clinical Impressions(s) / ED Diagnoses     Final diagnoses:  Chest pain, unspecified type    Disposition pending  Devoria Albe, MD, Concha Pyo, MD 02/04/18 [phone]

## 2018-02-04 NOTE — ED Notes (Signed)
Patient re-positioned several times for back pain.

## 2018-02-04 NOTE — Discharge Instructions (Addendum)
Follow-up Monday with your doctor as planned

## 2018-03-09 ENCOUNTER — Ambulatory Visit: Payer: Medicare Other | Admitting: Cardiovascular Disease

## 2018-05-24 ENCOUNTER — Telehealth: Payer: Self-pay | Admitting: Cardiovascular Disease

## 2018-05-24 NOTE — Telephone Encounter (Signed)
I spoke with Mrs. Dominique Matthews.  She is doing well from a cardiac standpoint she told me she is actually afraid to come in next week.  Given the COVID19 pandemic, I recommended that her appointment be rescheduled.  She was happy that I called.

## 2018-05-25 NOTE — Telephone Encounter (Signed)
Spoke with patient to re-schedule her appointment per Dr. Purvis Sheffield 07/14/2018 @ 1040AM

## 2018-05-29 ENCOUNTER — Ambulatory Visit: Payer: Medicare Other | Admitting: Cardiovascular Disease

## 2018-07-11 ENCOUNTER — Telehealth: Payer: Self-pay | Admitting: *Deleted

## 2018-07-11 NOTE — Telephone Encounter (Signed)
Patient verbally consented for telehealth visits today with [facility] and understands that her insurance company will be billed for the encounter. Aware to have vitals available

## 2018-07-14 ENCOUNTER — Encounter: Payer: Self-pay | Admitting: Cardiovascular Disease

## 2018-07-14 ENCOUNTER — Telehealth (INDEPENDENT_AMBULATORY_CARE_PROVIDER_SITE_OTHER): Payer: Medicare Other | Admitting: Cardiovascular Disease

## 2018-07-14 VITALS — BP 140/68 | HR 70 | Ht 65.0 in | Wt 180.0 lb

## 2018-07-14 DIAGNOSIS — R002 Palpitations: Secondary | ICD-10-CM

## 2018-07-14 DIAGNOSIS — I10 Essential (primary) hypertension: Secondary | ICD-10-CM

## 2018-07-14 DIAGNOSIS — I25118 Atherosclerotic heart disease of native coronary artery with other forms of angina pectoris: Secondary | ICD-10-CM

## 2018-07-14 DIAGNOSIS — Z955 Presence of coronary angioplasty implant and graft: Secondary | ICD-10-CM

## 2018-07-14 NOTE — Progress Notes (Signed)
Virtual Visit via Telephone Note   This visit type was conducted due to national recommendations for restrictions regarding the COVID-19 Pandemic (e.g. social distancing) in an effort to limit this patient's exposure and mitigate transmission in our community.  Due to her co-morbid illnesses, this patient is at least at moderate risk for complications without adequate follow up.  This format is felt to be most appropriate for this patient at this time.  The patient did not have access to video technology/had technical difficulties with video requiring transitioning to audio format only (telephone).  All issues noted in this document were discussed and addressed.  No physical exam could be performed with this format.  Please refer to the patient's chart for her  consent to telehealth for [facility].   Date:  07/14/2018   ID:  Dominique Matthews, DOB 11-20-1929, MRN [id number]  Patient Location: Home Provider Location: Home  PCP:  Selinda Flavin, MD  Cardiologist:  Prentice Docker, MD  Electrophysiologist:  None   Evaluation Performed:  Follow-Up Visit  Chief Complaint:  CAD  History of Present Illness:    Dominique Matthews is a 85 y.o. female with a history of coronary artery disease with stent placement in 1997.  She also has a history of occasional palpitations controlled with metoprolol.  Her aspirin and statin were previously discontinued by her PCP due to stomach issues.  She has a large hiatal hernia.  She's been having some back problems. She currently denies chest pain. She seldom has shortness of breath. She uses a walker or cane to ambulate. She denies palpitations.  The patient does not have symptoms concerning for COVID-19 infection (fever, chills, cough, or new shortness of breath).    Past Medical History:  Diagnosis Date  . Atrial fibrillation (HCC)   . CAD (coronary artery disease)   . Cerebrovascular disease    Nonobstructive  . Chronic anxiety   . Chronic benign  neutropenia (HCC)   . Diverticulitis   . Diverticulitis   . Dysrhythmia    AFib  . GERD (gastroesophageal reflux disease)   . History of supraventricular tachycardia   . Hypertension   . IBS (irritable bowel syndrome)   . Mixed dyslipidemia   . Obesity   . Osteoarthritis   . Palpitations   . PONV (postoperative nausea and vomiting)    Past Surgical History:  Procedure Laterality Date  . APPENDECTOMY    . BACK SURGERY    . Bone desinty  11/2015  . cardiac stent in 1997     at [facility]  . CARPAL TUNNEL RELEASE     Right; Bilateral trigger thumb release surgery  . CATARACT EXTRACTION W/PHACO Left 12/09/2017   Procedure: CATARACT EXTRACTION PHACO AND INTRAOCULAR LENS PLACEMENT LEFT EYE;  Surgeon: Fabio Pierce, MD;  Location: [facility] OR[facility];  Service: Ophthalmology;  Laterality: Left;  CDE: 37.61  . CHOLECYSTECTOMY    . CORONARY ANGIOPLASTY     stent  . ESOPHAGOGASTRODUODENOSCOPY N/A 05/17/2014   Procedure: ESOPHAGOGASTRODUODENOSCOPY (EGD);  Surgeon: Malissa Hippo, MD;  Location: [facility] ENDO SUITE;  Service: Endoscopy;  Laterality: N/A;  730  . KNEE ARTHROSCOPY Left   . THORACIC DISC SURGERY  11/03/2015   T 12  . TOTAL KNEE ARTHROPLASTY Right      Current Meds  Medication Sig  . acetaminophen (TYLENOL) 500 MG tablet Take 500 mg by mouth every 6 (six) hours as needed for moderate pain or headache.   . dicyclomine (BENTYL) 10 MG capsule  Take 20 mg by mouth 2 (two) times daily.   Marland Kitchen esomeprazole (NEXIUM) 20 MG capsule Take 20 mg by mouth daily.   Marland Kitchen gabapentin (NEURONTIN) 300 MG capsule Take 300 mg by mouth 3 (three) times daily.    Marland Kitchen lisinopril (PRINIVIL,ZESTRIL) 40 MG tablet Take 40 mg by mouth daily.    . metoprolol (TOPROL-XL) 50 MG 24 hr tablet Take 50 mg by mouth daily.   . nitroGLYCERIN (NITROSTAT) 0.4 MG SL tablet DISSOLVE 1 TAB UNDER TOUNGE FOR CHEST PAIN. MAY REPEAT EVERY 5 MINUTES FOR 3 DOSES. IF NO RELIEF CALL 911 OR GO TO ER (Patient taking differently: Place 0.4 mg  under the tongue every 5 (five) minutes as needed for chest pain. )  . ondansetron (ZOFRAN) 4 MG tablet Take 1 tablet (4 mg total) by mouth every 6 (six) hours.     Allergies:   Codeine and Morphine   Social History   Tobacco Use  . Smoking status: Never Smoker  . Smokeless tobacco: Never Used  Substance Use Topics  . Alcohol use: No    Alcohol/week: 0.0 standard drinks  . Drug use: No     Family Hx: The patient's family history includes Coronary artery disease in an other family member.  ROS:   Please see the history of present illness.     All other systems reviewed and are negative.   Prior CV studies:   The following studies were reviewed today:  NA  Labs/Other Tests and Data Reviewed:    EKG:  An ECG dated 02/04/18 was personally reviewed today and demonstrated:  NSR  Recent Labs: 02/04/2018: ALT 17; BUN 15; Creatinine, Ser 0.89; Hemoglobin 11.9; Platelets 158; Potassium 3.7; Sodium 135   Recent Lipid Panel No results found for: CHOL, TRIG, HDL, CHOLHDL, LDLCALC, LDLDIRECT  Wt Readings from Last 3 Encounters:  07/14/18 180 lb (81.6 kg)  02/04/18 180 lb (81.6 kg)  01/16/18 184 lb 1.4 oz (83.5 kg)     Objective:    Vital Signs:  BP 140/68 Comment: vitals from 05/25/18  Pulse 70   Ht 5\' 5"  (1.651 m)   Wt 180 lb (81.6 kg)   BMI 29.95 kg/m    VITAL SIGNS:  reviewed  ASSESSMENT & PLAN:    1.  Coronary artery disease with history of stent placement: Symptomatically stable.  Aspirin and statin previously stopped by her PCP due to stomach issues.  Continue metoprolol succinate.  2.  Palpitations: Controlled on Toprol-XL 50 mg daily.  No changes.  3.  Hypertension: Reasonably controlled on present therapy.  No changes.   COVID-19 Education: The signs and symptoms of COVID-19 were discussed with the patient and how to seek care for testing (follow up with PCP or arrange E-visit).  The importance of social distancing was discussed today.  Time:    Today, I have spent 15 minutes with the patient with telehealth technology discussing the above problems.     Medication Adjustments/Labs and Tests Ordered: Current medicines are reviewed at length with the patient today.  Concerns regarding medicines are outlined above.   Tests Ordered: No orders of the defined types were placed in this encounter.   Medication Changes: No orders of the defined types were placed in this encounter.   Disposition:  Follow up in 1 year(s)  Signed, Prentice Docker, MD  07/14/2018 10:13 AM    [facility]

## 2018-07-14 NOTE — Patient Instructions (Signed)

## 2019-04-11 ENCOUNTER — Other Ambulatory Visit: Payer: Self-pay

## 2019-04-11 ENCOUNTER — Emergency Department (HOSPITAL_COMMUNITY): Payer: Medicare PPO

## 2019-04-11 ENCOUNTER — Emergency Department (HOSPITAL_COMMUNITY)
Admission: EM | Admit: 2019-04-11 | Discharge: 2019-04-11 | Disposition: A | Discharge status: Home / Self Care | Payer: Medicare PPO | Attending: Emergency Medicine | Admitting: Emergency Medicine

## 2019-04-11 ENCOUNTER — Encounter (HOSPITAL_COMMUNITY): Payer: Self-pay | Admitting: Radiology

## 2019-04-11 DIAGNOSIS — G8929 Other chronic pain: Secondary | ICD-10-CM | POA: Diagnosis not present

## 2019-04-11 DIAGNOSIS — Z79899 Other long term (current) drug therapy: Secondary | ICD-10-CM | POA: Insufficient documentation

## 2019-04-11 DIAGNOSIS — I4891 Unspecified atrial fibrillation: Secondary | ICD-10-CM | POA: Diagnosis not present

## 2019-04-11 DIAGNOSIS — I2510 Atherosclerotic heart disease of native coronary artery without angina pectoris: Secondary | ICD-10-CM | POA: Insufficient documentation

## 2019-04-11 DIAGNOSIS — M545 Low back pain: Secondary | ICD-10-CM | POA: Insufficient documentation

## 2019-04-11 DIAGNOSIS — I10 Essential (primary) hypertension: Secondary | ICD-10-CM | POA: Insufficient documentation

## 2019-04-11 DIAGNOSIS — Z20822 Contact with and (suspected) exposure to covid-19: Secondary | ICD-10-CM | POA: Diagnosis not present

## 2019-04-11 DIAGNOSIS — R6883 Chills (without fever): Secondary | ICD-10-CM | POA: Insufficient documentation

## 2019-04-11 DIAGNOSIS — M79605 Pain in left leg: Secondary | ICD-10-CM | POA: Insufficient documentation

## 2019-04-11 DIAGNOSIS — M549 Dorsalgia, unspecified: Secondary | ICD-10-CM | POA: Diagnosis present

## 2019-04-11 LAB — COMPREHENSIVE METABOLIC PANEL
ALT: 17 U/L (ref 0–44)
AST: 20 U/L (ref 15–41)
Albumin: 4.2 g/dL (ref 3.5–5.0)
Alkaline Phosphatase: 54 U/L (ref 38–126)
Anion gap: 11 (ref 5–15)
BUN: 14 mg/dL (ref 8–23)
CO2: 24 mmol/L (ref 22–32)
Calcium: 9.1 mg/dL (ref 8.9–10.3)
Chloride: 96 mmol/L — ABNORMAL LOW (ref 98–111)
Creatinine, Ser: 0.84 mg/dL (ref 0.44–1.00)
GFR calc Af Amer: >60 mL/min (ref >60)
GFR calc non Af Amer: >60 mL/min (ref >60)
Glucose, Bld: 125 mg/dL — ABNORMAL HIGH (ref 70–99)
Potassium: 3.8 mmol/L (ref 3.5–5.1)
Sodium: 131 mmol/L — ABNORMAL LOW (ref 135–145)
Total Bilirubin: 1.1 mg/dL (ref 0.3–1.2)
Total Protein: 7 g/dL (ref 6.5–8.1)

## 2019-04-11 LAB — CBC WITH DIFFERENTIAL/PLATELET
Abs Immature Granulocytes: 0.01 10*3/uL (ref 0.00–0.07)
Basophils Absolute: 0 10*3/uL (ref 0.0–0.1)
Basophils Relative: 1 %
Eosinophils Absolute: 0 10*3/uL (ref 0.0–0.5)
Eosinophils Relative: 1 %
HCT: 36.6 % (ref 36.0–46.0)
Hemoglobin: 12.3 g/dL (ref 12.0–15.0)
Immature Granulocytes: 0 %
Lymphocytes Relative: 26 %
Lymphs Abs: 0.9 10*3/uL (ref 0.7–4.0)
MCH: 33.4 pg (ref 26.0–34.0)
MCHC: 33.6 g/dL (ref 30.0–36.0)
MCV: 99.5 fL (ref 80.0–100.0)
Monocytes Absolute: 0.3 10*3/uL (ref 0.1–1.0)
Monocytes Relative: 9 %
Neutro Abs: 2.3 10*3/uL (ref 1.7–7.7)
Neutrophils Relative %: 63 %
Platelets: 191 10*3/uL (ref 150–400)
RBC: 3.68 MIL/uL — ABNORMAL LOW (ref 3.87–5.11)
RDW: 11.9 % (ref 11.5–15.5)
WBC: 3.6 10*3/uL — ABNORMAL LOW (ref 4.0–10.5)
nRBC: 0 % (ref 0.0–0.2)

## 2019-04-11 LAB — TSH: TSH: 0.885 u[IU]/mL (ref 0.350–4.500)

## 2019-04-11 LAB — URINALYSIS, ROUTINE W REFLEX MICROSCOPIC
Bilirubin Urine: NEGATIVE
Glucose, UA: NEGATIVE mg/dL
Hgb urine dipstick: NEGATIVE
Ketones, ur: 5 mg/dL — AB
Leukocytes,Ua: NEGATIVE
Nitrite: NEGATIVE
Protein, ur: NEGATIVE mg/dL
Specific Gravity, Urine: 1.013 (ref 1.005–1.030)
pH: 7 (ref 5.0–8.0)

## 2019-04-11 LAB — RESPIRATORY PANEL BY RT PCR (FLU A&B, COVID)
Influenza A by PCR: NEGATIVE
Influenza B by PCR: NEGATIVE
SARS Coronavirus 2 by RT PCR: NEGATIVE

## 2019-04-11 LAB — TROPONIN I (HIGH SENSITIVITY)
Troponin I (High Sensitivity): <2 ng/L (ref <18)
Troponin I (High Sensitivity): <2 ng/L (ref <18)

## 2019-04-11 MED ORDER — OXYCODONE-ACETAMINOPHEN 5-325 MG PO TABS
1.0000 | ORAL_TABLET | Freq: Once | ORAL | Status: AC
  Administered 2019-04-11: 14:00:00 1 via ORAL
  Filled 2019-04-11: qty 1

## 2019-04-11 MED ORDER — GABAPENTIN 300 MG PO CAPS
300.0000 mg | ORAL_CAPSULE | Freq: Once | ORAL | Status: AC
  Administered 2019-04-11: 300 mg via ORAL
  Filled 2019-04-11: qty 1

## 2019-04-11 MED ORDER — SODIUM CHLORIDE 0.9 % IV BOLUS (SEPSIS)
500.0000 mL | Freq: Once | INTRAVENOUS | Status: AC
  Administered 2019-04-11: 18:00:00 500 mL via INTRAVENOUS

## 2019-04-11 NOTE — ED Notes (Signed)
Pt minimally able to bear weight with assistance. PT oxygen level remained at 97% while standing. Assisted pt back into bed and repositioned pt.

## 2019-04-11 NOTE — ED Triage Notes (Addendum)
Per EMS, pt reports chronic back pain flare-up. Pt denies injury. Pt denies pain at rest.  EMS states pt is also 85% on room air. EMS placed pt on 2 liters, with Freedom o2 saturation 95%. Pt also reports chills, body aches since hydrocodone dosage was changed. nad noted during triage.

## 2019-04-11 NOTE — Discharge Instructions (Addendum)
Your work-up was very reassuring today.  We checked your electrolytes, metabolic panel, complete blood counts, thyroid function, flu, RSV, Covid, chest x-ray, EKG, cardiac enzymes.  We scanned your leg to look for DVT.  We got an x-ray of your hip which showed some arthritis.  You should follow-up with your primary care doctor as well as your pain management doctor.  You can continue taking your pain medication as prescribed. Thank you for allowing me to care for you today. Please return to the emergency department if you have new or worsening symptoms.

## 2019-04-11 NOTE — ED Provider Notes (Addendum)
[facility] EMERGENCY DEPARTMENT Provider Note   CSN: [id number] Arrival date & time: 04/11/19  1213     History Chief Complaint  Patient presents with  . Back Pain    Dominique Matthews is a 86 y.o. female.  Patient is an 86 year old female who lives at home with her husband who has chronic back pain as well as hypertension, A. fib, coronary artery disease, the, diverticulitis presenting to the emergency department for complaints of chills and back pain.  Patient reports that her biggest complaint today is that she is having chills and sharp pains throughout her whole body over the last 1 week. This usually occurs at night and prevents her from sleeping. She reports that her back pain is her same usual chronic back pain and has not had any new injury or trauma.  She reports that she takes hydrocodone chronically from the pain clinic.  She is concerned that may be it is her pain medication that is causing her chills but she is not sure.  Per EMS, she had an oxygen saturation of 85% on room air.  However, patient is 98% on room air on my evaluation.  Patient is not complaining of any shortness of breath,  nausea, vomiting, diarrhea, dysuria, fever, change in appetite.  She reports that the sharp pains are mostly in her shoulders and chest and then radiate into the rest of her body.  Denies any syncope, lightheadedness or headaches.  She reports that she tried to call her pain management doctor today but was unable to get a hold of them.  She did take her hydrocodone this morning.         Past Medical History:  Diagnosis Date  . Atrial fibrillation (HCC)   . CAD (coronary artery disease)   . Cerebrovascular disease    Nonobstructive  . Chronic anxiety   . Chronic benign neutropenia (HCC)   . Diverticulitis   . Diverticulitis   . Dysrhythmia    AFib  . GERD (gastroesophageal reflux disease)   . History of supraventricular tachycardia   . Hypertension   . IBS (irritable bowel syndrome)     . Mixed dyslipidemia   . Obesity   . Osteoarthritis   . Palpitations   . PONV (postoperative nausea and vomiting)     Patient Active Problem List   Diagnosis Date Noted  . Diverticulitis of colon 04/02/2014  . Dyslipidemia 11/16/2010  . ESSENTIAL HYPERTENSION, BENIGN 04/21/2009  . CAD 04/21/2009  . PALPITATIONS, HX OF 04/21/2009  . IRRITABLE BOWEL SYNDROME, HX OF 04/21/2009  . POSTSURG PERCUT TRANSLUMINAL COR ANGPLSTY STS 04/21/2009    Past Surgical History:  Procedure Laterality Date  . APPENDECTOMY    . BACK SURGERY    . Bone desinty  11/2015  . cardiac stent in 1997     at [facility]  . CARPAL TUNNEL RELEASE     Right; Bilateral trigger thumb release surgery  . CATARACT EXTRACTION W/PHACO Left 12/09/2017   Procedure: CATARACT EXTRACTION PHACO AND INTRAOCULAR LENS PLACEMENT LEFT EYE;  Surgeon: Fabio Pierce, MD;  Location: [facility] ORS;  Service: Ophthalmology;  Laterality: Left;  CDE: 37.61  . CHOLECYSTECTOMY    . CORONARY ANGIOPLASTY     stent  . ESOPHAGOGASTRODUODENOSCOPY N/A 05/17/2014   Procedure: ESOPHAGOGASTRODUODENOSCOPY (EGD);  Surgeon: Malissa Hippo, MD;  Location: [facility] ENDO SUITE;  Service: Endoscopy;  Laterality: N/A;  730  . KNEE ARTHROSCOPY Left   . THORACIC DISC SURGERY  11/03/2015   T 12  .  TOTAL KNEE ARTHROPLASTY Right      OB History   No obstetric history on file.     Family History  Problem Relation Age of Onset  . Coronary artery disease Other     Social History   Tobacco Use  . Smoking status: Never Smoker  . Smokeless tobacco: Never Used  Substance Use Topics  . Alcohol use: No    Alcohol/week: 0.0 standard drinks  . Drug use: No    Home Medications Prior to Admission medications   Medication Sig Start Date End Date Taking? Authorizing Provider  acetaminophen (TYLENOL) 500 MG tablet Take 500 mg by mouth every 6 (six) hours as needed for moderate pain or headache.    Yes [provider]  dicyclomine (BENTYL) 10 MG  capsule Take 20 mg by mouth 2 (two) times daily.    Yes [provider]  esomeprazole (NEXIUM) 20 MG capsule Take 20 mg by mouth daily.    Yes [provider]  gabapentin (NEURONTIN) 300 MG capsule Take 300 mg by mouth 3 (three) times daily.     Yes [provider]  HYDROcodone-acetaminophen (NORCO/VICODIN) 5-325 MG tablet Take 1 tablet by mouth 3 (three) times daily as needed for moderate pain.   Yes [provider]  lisinopril (PRINIVIL,ZESTRIL) 40 MG tablet Take 40 mg by mouth daily.     Yes [provider]  metoprolol (TOPROL-XL) 50 MG 24 hr tablet Take 50 mg by mouth daily.    Yes [provider]  nitroGLYCERIN (NITROSTAT) 0.4 MG SL tablet DISSOLVE 1 TAB UNDER TOUNGE FOR CHEST PAIN. MAY REPEAT EVERY 5 MINUTES FOR 3 DOSES. IF NO RELIEF CALL 911 OR GO TO ER Patient taking differently: Place 0.4 mg under the tongue every 5 (five) minutes as needed for chest pain.  08/08/17  Yes Laqueta Linden, MD  ondansetron (ZOFRAN) 4 MG tablet Take 1 tablet (4 mg total) by mouth every 6 (six) hours. Patient not taking: Reported on 04/11/2019 01/16/18   Pauline Aus, PA-C    Allergies    Codeine and Morphine  Review of Systems   Review of Systems  Constitutional: Positive for chills. Negative for activity change, appetite change, diaphoresis, fatigue, fever and unexpected weight change.  HENT: Negative for congestion, rhinorrhea and sore throat.   Eyes: Negative for visual disturbance.  Respiratory: Negative for cough, chest tightness and shortness of breath.   Cardiovascular: Positive for chest pain. Negative for palpitations and leg swelling.  Gastrointestinal: Negative for abdominal pain, constipation, diarrhea, nausea and vomiting.  Endocrine: Negative for polyuria.  Genitourinary: Negative for dysuria.  Musculoskeletal: Positive for arthralgias, gait problem (Chronic, uses walker) and myalgias.  Neurological: Negative for dizziness and  headaches.    Physical Exam Updated Vital Signs BP 132/62 (BP Location: Right Arm)   Pulse 80   Temp 98.3 F (36.8 C) (Oral)   Resp 20   Ht 5\' 6"  (1.676 m)   Wt 81 kg   SpO2 94%   BMI 28.82 kg/m   Physical Exam Vitals and nursing note reviewed.  Constitutional:      General: She is not in acute distress.    Appearance: Normal appearance. She is not ill-appearing, toxic-appearing or diaphoretic.     Comments: Pleasant, elderly female  HENT:     Head: Normocephalic.     Nose: Nose normal.     Mouth/Throat:     Mouth: Mucous membranes are moist.  Eyes:     Conjunctiva/sclera: Conjunctivae normal.  Cardiovascular:     Rate and Rhythm: Normal rate and regular rhythm.     Pulses: Normal pulses.  Pulmonary:     Effort: Pulmonary effort is normal.  Abdominal:     General: Abdomen is flat.     Palpations: Abdomen is soft.     Tenderness: There is no abdominal tenderness. There is no guarding.  Musculoskeletal:        General: Normal range of motion.  Skin:    General: Skin is warm and dry.     Capillary Refill: Capillary refill takes less than 2 seconds.  Neurological:     General: No focal deficit present.     Mental Status: She is alert.  Psychiatric:        Mood and Affect: Mood normal.     ED Results / Procedures / Treatments   Labs (all labs ordered are listed, but only abnormal results are displayed) Labs Reviewed  URINALYSIS, ROUTINE W REFLEX MICROSCOPIC - Abnormal; Notable for the following components:      Result Value   Ketones, ur 5 (*)    All other components within normal limits  CBC WITH DIFFERENTIAL/PLATELET - Abnormal; Notable for the following components:   WBC 3.6 (*)    RBC 3.68 (*)    All other components within normal limits  COMPREHENSIVE METABOLIC PANEL - Abnormal; Notable for the following components:   Sodium 131 (*)    Chloride 96 (*)    Glucose, Bld 125 (*)    All other components within normal limits  RESPIRATORY PANEL BY RT PCR  (FLU A&B, COVID)  URINE CULTURE  TSH  TROPONIN I (HIGH SENSITIVITY)  TROPONIN I (HIGH SENSITIVITY)    EKG EKG Interpretation  Date/Time:  Wednesday April 11 2019 14:01:10 EST Ventricular Rate:  74 PR Interval:    QRS Duration: 91 QT Interval:  377 QTC Calculation: 419 R Axis:   21 Text Interpretation: Sinus rhythm Low voltage, precordial leads Abnormal R-wave progression, early transition Confirmed by Bethann Berkshire [phone]) on 04/11/2019 2:22:24 PM   Radiology US Venous Img Lower  Left (DVT Study)  Result Date: 04/11/2019 CLINICAL DATA:  Left leg pain swelling EXAM: LEFT LOWER EXTREMITY VENOUS DOPPLER ULTRASOUND TECHNIQUE: Gray-scale sonography with graded compression, as well as color Doppler and duplex ultrasound were performed to evaluate the lower extremity deep venous systems from the level of the common femoral vein and including the common femoral, femoral, profunda femoral, popliteal and calf veins including the posterior tibial, peroneal and gastrocnemius veins when visible. The superficial great saphenous vein was also interrogated. Spectral Doppler was utilized to evaluate flow at rest and with distal augmentation maneuvers in the common femoral, femoral and popliteal veins. COMPARISON:  None. FINDINGS: Contralateral Common Femoral Vein: Respiratory phasicity is normal and symmetric with the symptomatic side. No evidence of thrombus. Normal compressibility. Common Femoral Vein: No evidence of thrombus. Normal compressibility, respiratory phasicity and response to augmentation. Saphenofemoral Junction: No evidence of thrombus. Normal compressibility and flow on color Doppler imaging. Profunda Femoral Vein: No evidence of thrombus. Normal compressibility and flow on color Doppler imaging. Femoral Vein: No evidence of thrombus. Normal compressibility, respiratory phasicity and response to augmentation. Popliteal Vein: No evidence of thrombus. Normal compressibility, respiratory  phasicity and response to augmentation. Calf Veins: No evidence of thrombus. Normal compressibility and flow on color Doppler imaging. Other Findings: Popliteal fossa Baker's cyst measures 2.5 x 0.8 x 2.4 cm IMPRESSION: No evidence of deep venous thrombosis. Electronically Signed   By:  M.  Shick M.D.   On: 04/11/2019 16:42   DG Chest Portable 1 View  Result Date: 04/11/2019 CLINICAL DATA:  Chills and body aches. EXAM: PORTABLE CHEST 1 VIEW COMPARISON:  Chest x-ray dated February 04, 2018. FINDINGS: Normal heart size. Unchanged large hiatal hernia with the majority of the stomach in the left chest. Normal pulmonary vascularity. No focal consolidation, pleural effusion, or pneumothorax. No acute osseous abnormality. Prior T12 cement augmentation. IMPRESSION: 1. No active disease. 2. Unchanged large hiatal hernia. Electronically Signed   By: Titus Dubin M.D.   On: 04/11/2019 13:24   DG Hip Unilat W or Wo Pelvis 2-3 Views Left  Result Date: 04/11/2019 CLINICAL DATA:  Left lower extremity swelling. EXAM: DG HIP (WITH OR WITHOUT PELVIS) 2-3V LEFT COMPARISON:  None. FINDINGS: There is no evidence of hip fracture or dislocation. Mild narrowing of left hip joint is noted. IMPRESSION: Mild degenerative joint disease of left hip. No acute abnormality seen. Electronically Signed   By: Marijo Conception M.D.   On: 04/11/2019 16:46    Procedures Procedures (including critical care time)  Medications Ordered in ED Medications  oxyCODONE-acetaminophen (PERCOCET/ROXICET) 5-325 MG per tablet 1 tablet (1 tablet Oral Given 04/11/19 1347)  gabapentin (NEURONTIN) capsule 300 mg (300 mg Oral Given 04/11/19 1659)  sodium chloride 0.9 % bolus 500 mL (0 mLs Intravenous Stopped 04/11/19 1928)    ED Course  I have reviewed the triage vital signs and the nursing notes.  Pertinent labs & imaging results that were available during my care of the patient were reviewed by me and considered in my medical decision making (see chart  for details).  Clinical Course as of Apr 10 2046  Wed Apr 11, 2019  1508 Elderly female with chronic back pain presenting with chills and hot flashes at night time for 1 week. She believes it may be from her hydrocodone. No recent change in medication. No change in her back pain. No fever or URI symptoms. Normal vitals on arrival   [KM]  1550 Workup including trop x2, CBC, CMP, chest xray are without acute findings. When I re-evaluate the patient she tells me she has also been having LLE swelling and pain as well as hip and groin pain. Further imaging ordered to assess this pain   [KM]  1717 The remainder of patient's work-up is unremarkable.  No etiology for the patient's chills today.  Patient is relatively bedbound at baseline.  Able to stand up at her baseline today.  She remained 95 to 100% on room air even with ambulation. Urinalysis revealed 5 ketones but otherwise normal. Advised patient she needs to follow-up with her primary care doctor for further work-up and evaluation. Dr. Rogene Houston did also evaluated the patient and agrees with plan   [KM]    Clinical Course User Index [KM] Kristine Royal   MDM Rules/Calculators/A&P                      Based on review of vitals, medical screening exam, lab work and/or imaging, there does not appear to be an acute, emergent etiology for the patient's symptoms. Counseled pt on good return precautions and encouraged both PCP and ED follow-up as needed.  Prior to discharge, I also discussed incidental imaging findings with patient in detail and advised appropriate, recommended follow-up in detail.  Clinical Impression: 1. Chills   2. Chronic low back pain, unspecified back pain laterality, unspecified whether sciatica present  Disposition: Discharge  Prior to providing a prescription for a controlled substance, I independently reviewed the patient's recent prescription history on the [place] Controlled Substance Reporting  System. The patient had no recent or regular prescriptions and was deemed appropriate for a brief, less than 3 day prescription of narcotic for acute analgesia.  This note was prepared with assistance of Conservation officer, historic buildings. Occasional wrong-word or sound-a-like substitutions may have occurred due to the inherent limitations of voice recognition software.  Final Clinical Impression(s) / ED Diagnoses Final diagnoses:  Chills  Chronic low back pain, unspecified back pain laterality, unspecified whether sciatica present    Rx / DC Orders ED Discharge Orders    None       Jeral Pinch 04/11/19 Romilda Garret, MD 04/11/19 1946    Arlyn Dunning, PA-C 04/11/19 [id number]    Vanetta Mulders, MD 04/18/19 [phone]

## 2019-04-13 LAB — URINE CULTURE: Culture: NO GROWTH

## 2019-07-20 ENCOUNTER — Telehealth: Payer: Self-pay | Admitting: Cardiovascular Disease

## 2019-07-20 NOTE — Telephone Encounter (Signed)
  Patient Consent for Virtual Visit         Dominique Matthews has provided verbal consent on 07/20/2019 for a virtual visit (video or telephone).   CONSENT FOR VIRTUAL VISIT FOR:  Dominique Matthews  By participating in this virtual visit I agree to the following:  I hereby voluntarily request, consent and authorize [facility] and its employed or contracted physicians, physician assistants, nurse practitioners or other licensed health care professionals (the Practitioner), to provide me with telemedicine health care services (the "Services") as deemed necessary by the treating Practitioner. I acknowledge and consent to receive the Services by the Practitioner via telemedicine. I understand that the telemedicine visit will involve communicating with the Practitioner through live audiovisual communication technology and the disclosure of certain medical information by electronic transmission. I acknowledge that I have been given the opportunity to request an in-person assessment or other available alternative prior to the telemedicine visit and am voluntarily participating in the telemedicine visit.  I understand that I have the right to withhold or withdraw my consent to the use of telemedicine in the course of my care at any time, without affecting my right to future care or treatment, and that the Practitioner or I may terminate the telemedicine visit at any time. I understand that I have the right to inspect all information obtained and/or recorded in the course of the telemedicine visit and may receive copies of available information for a reasonable fee.  I understand that some of the potential risks of receiving the Services via telemedicine include:  Marland Kitchen Delay or interruption in medical evaluation due to technological equipment failure or disruption; . Information transmitted may not be sufficient (e.g. poor resolution of images) to allow for appropriate medical decision making by the Practitioner;  and/or  . In rare instances, security protocols could fail, causing a breach of personal health information.  Furthermore, I acknowledge that it is my responsibility to provide information about my medical history, conditions and care that is complete and accurate to the best of my ability. I acknowledge that Practitioner's advice, recommendations, and/or decision may be based on factors not within their control, such as incomplete or inaccurate data provided by me or distortions of diagnostic images or specimens that may result from electronic transmissions. I understand that the practice of medicine is not an exact science and that Practitioner makes no warranties or guarantees regarding treatment outcomes. I acknowledge that a copy of this consent can be made available to me via my patient portal [facility] MyChart), or I can request a printed copy by calling the office of [facility].    I understand that my insurance will be billed for this visit.   I have read or had this consent read to me. . I understand the contents of this consent, which adequately explains the benefits and risks of the Services being provided via telemedicine.  . I have been provided ample opportunity to ask questions regarding this consent and the Services and have had my questions answered to my satisfaction. . I give my informed consent for the services to be provided through the use of telemedicine in my medical care

## 2019-07-23 ENCOUNTER — Telehealth (INDEPENDENT_AMBULATORY_CARE_PROVIDER_SITE_OTHER): Payer: Medicare PPO | Admitting: Cardiovascular Disease

## 2019-07-23 ENCOUNTER — Encounter: Payer: Self-pay | Admitting: Cardiovascular Disease

## 2019-07-23 VITALS — HR 72 | Temp 96.3°F | Ht 65.0 in | Wt 180.0 lb

## 2019-07-23 DIAGNOSIS — I25118 Atherosclerotic heart disease of native coronary artery with other forms of angina pectoris: Secondary | ICD-10-CM | POA: Diagnosis not present

## 2019-07-23 DIAGNOSIS — Z955 Presence of coronary angioplasty implant and graft: Secondary | ICD-10-CM | POA: Diagnosis not present

## 2019-07-23 DIAGNOSIS — I10 Essential (primary) hypertension: Secondary | ICD-10-CM

## 2019-07-23 DIAGNOSIS — R002 Palpitations: Secondary | ICD-10-CM

## 2019-07-23 NOTE — Progress Notes (Signed)
Virtual Visit via Telephone Note   This visit type was conducted due to national recommendations for restrictions regarding the COVID-19 Pandemic (e.g. social distancing) in an effort to limit this patient's exposure and mitigate transmission in our community.  Due to her co-morbid illnesses, this patient is at least at moderate risk for complications without adequate follow up.  This format is felt to be most appropriate for this patient at this time.  The patient did not have access to video technology/had technical difficulties with video requiring transitioning to audio format only (telephone).  All issues noted in this document were discussed and addressed.  No physical exam could be performed with this format.  Please refer to the patient's chart for her  consent to telehealth for [facility].   Date:  07/23/2019   ID:  Dominique Matthews, DOB [birth year], MRN [id number]  Patient Location: Home Provider Location: Home  PCP:  Selinda Flavin, MD  Cardiologist:  Prentice Docker, MD  Electrophysiologist:  None   Evaluation Performed:  Follow-Up Visit  Chief Complaint:  CAD  History of Present Illness:    Dominique Matthews is a 86 y.o. female with a history of coronary artery disease with stent placement in 1997.  She also has a history of occasional palpitations controlled with metoprolol.  Her aspirin and statin were previously discontinued by her PCP due to stomach issues.  She has a large hiatal hernia.  She sees pain management in [place].  She seldom has palpitations. She denies chest pain.  Her primary issues relate to chronic back pain.  She has issues with anxiety as well.   Past Medical History:  Diagnosis Date  . Atrial fibrillation (HCC)   . CAD (coronary artery disease)   . Cerebrovascular disease    Nonobstructive  . Chronic anxiety   . Chronic benign neutropenia (HCC)   . Diverticulitis   . Diverticulitis   . Dysrhythmia    AFib  . GERD (gastroesophageal  reflux disease)   . History of supraventricular tachycardia   . Hypertension   . IBS (irritable bowel syndrome)   . Mixed dyslipidemia   . Obesity   . Osteoarthritis   . Palpitations   . PONV (postoperative nausea and vomiting)    Past Surgical History:  Procedure Laterality Date  . APPENDECTOMY    . BACK SURGERY    . Bone desinty  11/2015  . cardiac stent in 1997     at [facility]  . CARPAL TUNNEL RELEASE     Right; Bilateral trigger thumb release surgery  . CATARACT EXTRACTION W/PHACO Left 12/09/2017   Procedure: CATARACT EXTRACTION PHACO AND INTRAOCULAR LENS PLACEMENT LEFT EYE;  Surgeon: Fabio Pierce, MD;  Location: [facility] OR[facility];  Service: Ophthalmology;  Laterality: Left;  CDE: 37.61  . CHOLECYSTECTOMY    . CORONARY ANGIOPLASTY     stent  . ESOPHAGOGASTRODUODENOSCOPY N/A 05/17/2014   Procedure: ESOPHAGOGASTRODUODENOSCOPY (EGD);  Surgeon: Malissa Hippo, MD;  Location: [facility] END[facility] SU[facility];  Service: Endoscopy;  Laterality: N/A;  730  . KNEE ARTHROSCOPY Left   . THORACIC DISC SURGERY  11/03/2015   T 12  . TOTAL KNEE ARTHROPLASTY Right      Current Meds  Medication Sig  . acetaminophen (TYLENOL) 500 MG tablet Take 500 mg by mouth every 6 (six) hours as needed for moderate pain or headache.   . dicyclomine (BENTYL) 10 MG capsule Take 20 mg by mouth 2 (two) times daily.   Marland Kitchen esomeprazole (NEXIUM) 20 MG  capsule Take 20 mg by mouth daily.   Marland Kitchen gabapentin (NEURONTIN) 300 MG capsule Take 300 mg by mouth 3 (three) times daily.    Marland Kitchen HYDROcodone-acetaminophen (NORCO/VICODIN) 5-325 MG tablet Take 1 tablet by mouth 3 (three) times daily as needed for moderate pain.  Marland Kitchen lisinopril (PRINIVIL,ZESTRIL) 40 MG tablet Take 40 mg by mouth daily.    . metoprolol (TOPROL-XL) 50 MG 24 hr tablet Take 50 mg by mouth daily.   . nitroGLYCERIN (NITROSTAT) 0.4 MG SL tablet DISSOLVE 1 TAB UNDER TOUNGE FOR CHEST PAIN. MAY REPEAT EVERY 5 MINUTES FOR 3 DOSES. IF NO RELIEF CALL 911 OR GO TO ER (Patient taking  differently: Place 0.4 mg under the tongue every 5 (five) minutes as needed for chest pain. )  . ondansetron (ZOFRAN) 4 MG tablet Take 1 tablet (4 mg total) by mouth every 6 (six) hours.     Allergies:   Codeine and Morphine   Social History   Tobacco Use  . Smoking status: Never Smoker  . Smokeless tobacco: Never Used  Substance Use Topics  . Alcohol use: No    Alcohol/week: 0.0 standard drinks  . Drug use: No     Family Hx: The patient's family history includes Coronary artery disease in an other family member.  ROS:   Please see the history of present illness.     All other systems reviewed and are negative.   Prior CV studies:   The following studies were reviewed today:  NA  Labs/Other Tests and Data Reviewed:    EKG:  An ECG dated 04/11/19 which I personally reviewed demonstrated sinus rhythm with no ischemic abnormalities.   Recent Labs: 04/11/2019: ALT 17; BUN 14; Creatinine, Ser 0.84; Hemoglobin 12.3; Platelets 191; Potassium 3.8; Sodium 131; TSH 0.885   Recent Lipid Panel No results found for: CHOL, TRIG, HDL, CHOLHDL, LDLCALC, LDLDIRECT  Wt Readings from Last 3 Encounters:  07/23/19 180 lb (81.6 kg)  04/11/19 178 lb 9.2 oz (81 kg)  07/14/18 180 lb (81.6 kg)     Objective:    Vital Signs:  Pulse 72   Temp (!) 96.3 F (35.7 C)   Ht 5\' 5"  (1.651 m)   Wt 180 lb (81.6 kg)   BMI 29.95 kg/m    VITAL SIGNS:  reviewed  ASSESSMENT & PLAN:    1.  Coronary artery disease with history of stent placement: Symptomatically stable.  Aspirin and statin previously stopped by her PCP due to stomach issues.  Continue metoprolol succinate.  2.  Palpitations: Controlled on Toprol-XL 50 mg daily.  No changes.  3.  Hypertension: No changes.   COVID-19 Education: The signs and symptoms of COVID-19 were discussed with the patient and how to seek care for testing (follow up with PCP or arrange E-visit).  The importance of social distancing was discussed  today.  Time:   Today, I have spent 15 minutes with the patient with telehealth technology discussing the above problems.     Medication Adjustments/Labs and Tests Ordered: Current medicines are reviewed at length with the patient today.  Concerns regarding medicines are outlined above.   Tests Ordered: No orders of the defined types were placed in this encounter.   Medication Changes: No orders of the defined types were placed in this encounter.   Disposition:  Follow up in 1 year(s) office visit  Signed, Kate Sable, MD  07/23/2019 10:26 AM    [place]

## 2019-07-23 NOTE — Patient Instructions (Signed)

## 2019-09-18 ENCOUNTER — Emergency Department (HOSPITAL_COMMUNITY): Payer: Medicare PPO

## 2019-09-18 ENCOUNTER — Inpatient Hospital Stay (HOSPITAL_COMMUNITY)
Admission: EM | Admit: 2019-09-18 | Discharge: 2019-09-21 | DRG: 093 | Disposition: A | Discharge status: Skilled Nursing Facility | Payer: Medicare PPO | Attending: Family Medicine | Admitting: Family Medicine

## 2019-09-18 ENCOUNTER — Encounter (HOSPITAL_COMMUNITY): Payer: Self-pay | Admitting: Emergency Medicine

## 2019-09-18 DIAGNOSIS — Z79899 Other long term (current) drug therapy: Secondary | ICD-10-CM

## 2019-09-18 DIAGNOSIS — K219 Gastro-esophageal reflux disease without esophagitis: Secondary | ICD-10-CM | POA: Diagnosis present

## 2019-09-18 DIAGNOSIS — G894 Chronic pain syndrome: Principal | ICD-10-CM | POA: Diagnosis present

## 2019-09-18 DIAGNOSIS — K589 Irritable bowel syndrome without diarrhea: Secondary | ICD-10-CM | POA: Diagnosis present

## 2019-09-18 DIAGNOSIS — Z20822 Contact with and (suspected) exposure to covid-19: Secondary | ICD-10-CM | POA: Diagnosis present

## 2019-09-18 DIAGNOSIS — R1032 Left lower quadrant pain: Secondary | ICD-10-CM | POA: Diagnosis present

## 2019-09-18 DIAGNOSIS — I679 Cerebrovascular disease, unspecified: Secondary | ICD-10-CM | POA: Diagnosis present

## 2019-09-18 DIAGNOSIS — Z96651 Presence of right artificial knee joint: Secondary | ICD-10-CM | POA: Diagnosis present

## 2019-09-18 DIAGNOSIS — Z9049 Acquired absence of other specified parts of digestive tract: Secondary | ICD-10-CM

## 2019-09-18 DIAGNOSIS — R330 Drug induced retention of urine: Secondary | ICD-10-CM | POA: Diagnosis present

## 2019-09-18 DIAGNOSIS — M48061 Spinal stenosis, lumbar region without neurogenic claudication: Secondary | ICD-10-CM | POA: Diagnosis present

## 2019-09-18 DIAGNOSIS — Z885 Allergy status to narcotic agent status: Secondary | ICD-10-CM

## 2019-09-18 DIAGNOSIS — R002 Palpitations: Secondary | ICD-10-CM | POA: Diagnosis present

## 2019-09-18 DIAGNOSIS — I10 Essential (primary) hypertension: Secondary | ICD-10-CM | POA: Diagnosis present

## 2019-09-18 DIAGNOSIS — Z638 Other specified problems related to primary support group: Secondary | ICD-10-CM

## 2019-09-18 DIAGNOSIS — T400X5A Adverse effect of opium, initial encounter: Secondary | ICD-10-CM | POA: Diagnosis present

## 2019-09-18 DIAGNOSIS — Z8679 Personal history of other diseases of the circulatory system: Secondary | ICD-10-CM

## 2019-09-18 DIAGNOSIS — K5903 Drug induced constipation: Secondary | ICD-10-CM | POA: Diagnosis present

## 2019-09-18 DIAGNOSIS — Z8673 Personal history of transient ischemic attack (TIA), and cerebral infarction without residual deficits: Secondary | ICD-10-CM

## 2019-09-18 DIAGNOSIS — I2510 Atherosclerotic heart disease of native coronary artery without angina pectoris: Secondary | ICD-10-CM | POA: Diagnosis present

## 2019-09-18 DIAGNOSIS — E782 Mixed hyperlipidemia: Secondary | ICD-10-CM | POA: Diagnosis present

## 2019-09-18 DIAGNOSIS — Z955 Presence of coronary angioplasty implant and graft: Secondary | ICD-10-CM

## 2019-09-18 DIAGNOSIS — M8938 Hypertrophy of bone, other site: Secondary | ICD-10-CM | POA: Diagnosis present

## 2019-09-18 DIAGNOSIS — R531 Weakness: Secondary | ICD-10-CM | POA: Diagnosis present

## 2019-09-18 DIAGNOSIS — R269 Unspecified abnormalities of gait and mobility: Secondary | ICD-10-CM | POA: Diagnosis present

## 2019-09-18 DIAGNOSIS — G8929 Other chronic pain: Secondary | ICD-10-CM | POA: Diagnosis not present

## 2019-09-18 DIAGNOSIS — Z8249 Family history of ischemic heart disease and other diseases of the circulatory system: Secondary | ICD-10-CM

## 2019-09-18 LAB — COMPREHENSIVE METABOLIC PANEL
ALT: 16 U/L (ref 0–44)
AST: 15 U/L (ref 15–41)
Albumin: 4.2 g/dL (ref 3.5–5.0)
Alkaline Phosphatase: 58 U/L (ref 38–126)
Anion gap: 11 (ref 5–15)
BUN: 16 mg/dL (ref 8–23)
CO2: 27 mmol/L (ref 22–32)
Calcium: 9.6 mg/dL (ref 8.9–10.3)
Chloride: 97 mmol/L — ABNORMAL LOW (ref 98–111)
Creatinine, Ser: 0.9 mg/dL (ref 0.44–1.00)
GFR calc Af Amer: >60 mL/min (ref >60)
GFR calc non Af Amer: 57 mL/min — ABNORMAL LOW (ref >60)
Glucose, Bld: 104 mg/dL — ABNORMAL HIGH (ref 70–99)
Potassium: 4.3 mmol/L (ref 3.5–5.1)
Sodium: 135 mmol/L (ref 135–145)
Total Bilirubin: 1.2 mg/dL (ref 0.3–1.2)
Total Protein: 7.5 g/dL (ref 6.5–8.1)

## 2019-09-18 LAB — CBC WITH DIFFERENTIAL/PLATELET
Abs Immature Granulocytes: 0.01 10*3/uL (ref 0.00–0.07)
Basophils Absolute: 0 10*3/uL (ref 0.0–0.1)
Basophils Relative: 1 %
Eosinophils Absolute: 0.1 10*3/uL (ref 0.0–0.5)
Eosinophils Relative: 1 %
HCT: 37.7 % (ref 36.0–46.0)
Hemoglobin: 13 g/dL (ref 12.0–15.0)
Immature Granulocytes: 0 %
Lymphocytes Relative: 27 %
Lymphs Abs: 1.2 10*3/uL (ref 0.7–4.0)
MCH: 33.9 pg (ref 26.0–34.0)
MCHC: 34.5 g/dL (ref 30.0–36.0)
MCV: 98.2 fL (ref 80.0–100.0)
Monocytes Absolute: 0.3 10*3/uL (ref 0.1–1.0)
Monocytes Relative: 8 %
Neutro Abs: 2.7 10*3/uL (ref 1.7–7.7)
Neutrophils Relative %: 63 %
Platelets: 200 10*3/uL (ref 150–400)
RBC: 3.84 MIL/uL — ABNORMAL LOW (ref 3.87–5.11)
RDW: 11.8 % (ref 11.5–15.5)
WBC: 4.3 10*3/uL (ref 4.0–10.5)
nRBC: 0 % (ref 0.0–0.2)

## 2019-09-18 LAB — URINALYSIS, ROUTINE W REFLEX MICROSCOPIC
Bilirubin Urine: NEGATIVE
Glucose, UA: NEGATIVE mg/dL
Hgb urine dipstick: NEGATIVE
Ketones, ur: NEGATIVE mg/dL
Leukocytes,Ua: NEGATIVE
Nitrite: NEGATIVE
Protein, ur: NEGATIVE mg/dL
Specific Gravity, Urine: 1.013 (ref 1.005–1.030)
pH: 6 (ref 5.0–8.0)

## 2019-09-18 LAB — LIPASE, BLOOD: Lipase: 28 U/L (ref 11–51)

## 2019-09-18 LAB — SARS CORONAVIRUS 2 BY RT PCR (HOSPITAL ORDER, PERFORMED IN ~~LOC~~ HOSPITAL LAB): SARS Coronavirus 2: NEGATIVE

## 2019-09-18 MED ORDER — DICYCLOMINE HCL 10 MG PO CAPS
20.0000 mg | ORAL_CAPSULE | Freq: Two times a day (BID) | ORAL | Status: DC
  Administered 2019-09-19 – 2019-09-21 (×6): 20 mg via ORAL
  Filled 2019-09-18 (×6): qty 2

## 2019-09-18 MED ORDER — POLYETHYLENE GLYCOL 3350 17 G PO PACK: 17.0000 g | PACK | Freq: Every day | ORAL | Status: DC | PRN

## 2019-09-18 MED ORDER — IOHEXOL 300 MG/ML  SOLN
100.0000 mL | Freq: Once | INTRAMUSCULAR | Status: AC | PRN
  Administered 2019-09-18: 100 mL via INTRAVENOUS

## 2019-09-18 MED ORDER — HYDROMORPHONE HCL 1 MG/ML IJ SOLN
0.5000 mg | INTRAMUSCULAR | Status: DC | PRN
  Administered 2019-09-19 – 2019-09-21 (×11): 0.5 mg via INTRAVENOUS
  Filled 2019-09-18 (×11): qty 0.5

## 2019-09-18 MED ORDER — HYDROMORPHONE HCL 1 MG/ML IJ SOLN
0.5000 mg | Freq: Once | INTRAMUSCULAR | Status: AC
  Administered 2019-09-18: 0.5 mg via INTRAVENOUS
  Filled 2019-09-18: qty 1

## 2019-09-18 MED ORDER — METOPROLOL SUCCINATE ER 50 MG PO TB24
50.0000 mg | ORAL_TABLET | Freq: Every day | ORAL | Status: DC
  Administered 2019-09-19 – 2019-09-21 (×3): 50 mg via ORAL
  Filled 2019-09-18 (×3): qty 1

## 2019-09-18 MED ORDER — LISINOPRIL 10 MG PO TABS
40.0000 mg | ORAL_TABLET | Freq: Every day | ORAL | Status: DC
  Administered 2019-09-19 – 2019-09-21 (×3): 40 mg via ORAL
  Filled 2019-09-18 (×3): qty 4

## 2019-09-18 MED ORDER — ACETAMINOPHEN 325 MG PO TABS
650.0000 mg | ORAL_TABLET | Freq: Four times a day (QID) | ORAL | Status: DC | PRN
  Administered 2019-09-19: 650 mg via ORAL
  Filled 2019-09-18: qty 2

## 2019-09-18 MED ORDER — LORAZEPAM 2 MG/ML IJ SOLN
1.0000 mg | Freq: Once | INTRAMUSCULAR | Status: AC
  Administered 2019-09-18: 1 mg via INTRAVENOUS
  Filled 2019-09-18: qty 1

## 2019-09-18 MED ORDER — FENTANYL CITRATE (PF) 100 MCG/2ML IJ SOLN
50.0000 ug | Freq: Once | INTRAMUSCULAR | Status: AC
  Administered 2019-09-18: 50 ug via INTRAVENOUS
  Filled 2019-09-18: qty 2

## 2019-09-18 MED ORDER — ENOXAPARIN SODIUM 40 MG/0.4ML ~~LOC~~ SOLN
40.0000 mg | Freq: Every day | SUBCUTANEOUS | Status: DC
  Administered 2019-09-19 – 2019-09-20 (×3): 40 mg via SUBCUTANEOUS
  Filled 2019-09-18 (×3): qty 0.4

## 2019-09-18 MED ORDER — ACETAMINOPHEN 650 MG RE SUPP: 650.0000 mg | Freq: Four times a day (QID) | RECTAL | Status: DC | PRN

## 2019-09-18 MED ORDER — PANTOPRAZOLE SODIUM 40 MG PO TBEC
40.0000 mg | DELAYED_RELEASE_TABLET | Freq: Every day | ORAL | Status: DC
  Administered 2019-09-19 – 2019-09-21 (×3): 40 mg via ORAL
  Filled 2019-09-18 (×3): qty 1

## 2019-09-18 MED ORDER — GABAPENTIN 300 MG PO CAPS
300.0000 mg | ORAL_CAPSULE | Freq: Three times a day (TID) | ORAL | Status: DC
  Administered 2019-09-19 – 2019-09-21 (×8): 300 mg via ORAL
  Filled 2019-09-18 (×8): qty 1

## 2019-09-18 MED ORDER — ONDANSETRON HCL 4 MG/2ML IJ SOLN
4.0000 mg | Freq: Four times a day (QID) | INTRAMUSCULAR | Status: DC | PRN
  Administered 2019-09-19: 4 mg via INTRAVENOUS
  Filled 2019-09-18: qty 2

## 2019-09-18 MED ORDER — ONDANSETRON HCL 4 MG PO TABS: 4.0000 mg | ORAL_TABLET | Freq: Four times a day (QID) | ORAL | Status: DC | PRN

## 2019-09-18 NOTE — ED Notes (Signed)
Spoke to daughter in law and updated on status of pt.

## 2019-09-18 NOTE — H&P (Signed)
History and Physical    Dominique Matthews BZJ:[id number] DOB: 12-Apr-1929 DOA: 09/18/2019  PCP: Selinda Flavin, MD   Patient coming from: Home  I have personally briefly reviewed patient's old medical records in [facility]  Chief Complaint: Left groin pain   HPI: Dominique Matthews is a 86 y.o. female with medical history significant for hypertension, atrial fibrillation, CVA, coronary artery disease, chronic pain. Patient presented to the ED with complaints of left groin pain ongoing over the past 3 days, that radiates down her left lower extremity.  Complains of some redness to her left heel.  Patient reported that she took a total of 3 tablets Q8h of her oxycodone, without significant improvement in her pain.  She denies falls.  Patient has chronic back pain, which is uncontrolled with her home pain regimen.  She has had several back injections.  Spouse is present at bedside, he says he has to help patient out of bed in the mornings, but this is not new.  At baseline patient ambulates with a walker, and she is able to move around her house.  ED Course: Stable vitals.  Unremarkable CBC, CMP.  UA Not suggestive of infection.  EKG No change from prior.  Abdominal pelvic CT with contrast-negative for acute abnormalities, diverticulosis without diverticulitis.  In the ED , on ambulation, patient was only able to walk 4 steps with her walker, and required 2 person full assist.  Review of Systems: As per HPI all other systems reviewed and negative.  Past Medical History:  Diagnosis Date  . Atrial fibrillation (HCC)   . CAD (coronary artery disease)   . Cerebrovascular disease    Nonobstructive  . Chronic anxiety   . Chronic benign neutropenia (HCC)   . Diverticulitis   . Diverticulitis   . Dysrhythmia    AFib  . GERD (gastroesophageal reflux disease)   . History of supraventricular tachycardia   . Hypertension   . IBS (irritable bowel syndrome)   . Mixed dyslipidemia   . Obesity   .  Osteoarthritis   . Palpitations   . PONV (postoperative nausea and vomiting)     Past Surgical History:  Procedure Laterality Date  . APPENDECTOMY    . BACK SURGERY    . Bone desinty  11/2015  . cardiac stent in 1997     at [facility]  . CARPAL TUNNEL RELEASE     Right; Bilateral trigger thumb release surgery  . CATARACT EXTRACTION W/PHACO Left 12/09/2017   Procedure: CATARACT EXTRACTION PHACO AND INTRAOCULAR LENS PLACEMENT LEFT EYE;  Surgeon: Fabio Pierce, MD;  Location: [facility];  Service: Ophthalmology;  Laterality: Left;  CDE: 37.61  . CHOLECYSTECTOMY    . CORONARY ANGIOPLASTY     stent  . ESOPHAGOGASTRODUODENOSCOPY N/A 05/17/2014   Procedure: ESOPHAGOGASTRODUODENOSCOPY (EGD);  Surgeon: Malissa Hippo, MD;  Location: [facility];  Service: Endoscopy;  Laterality: N/A;  730  . KNEE ARTHROSCOPY Left   . THORACIC DISC SURGERY  11/03/2015   T 12  . TOTAL KNEE ARTHROPLASTY Right      reports that she has never smoked. She has never used smokeless tobacco. She reports that she does not drink alcohol and does not use drugs.  Allergies  Allergen Reactions  . Codeine Other (See Comments)    Sweating  . Morphine Other (See Comments)    Sweating    Family History  Problem Relation Age of Onset  . Coronary artery disease Other  Prior to Admission medications   Medication Sig Start Date End Date Taking? Authorizing Provider  acetaminophen (TYLENOL) 500 MG tablet Take 500 mg by mouth every 6 (six) hours as needed for moderate pain or headache.    Yes [provider]  dicyclomine (BENTYL) 10 MG capsule Take 20 mg by mouth 2 (two) times daily.    Yes [provider]  esomeprazole (NEXIUM) 20 MG capsule Take 20 mg by mouth daily.    Yes [provider]  gabapentin (NEURONTIN) 300 MG capsule Take 300 mg by mouth 3 (three) times daily.     Yes [provider]  HYDROcodone-acetaminophen (NORCO/VICODIN) 5-325 MG tablet Take 1 tablet by  mouth 3 (three) times daily as needed for moderate pain.   Yes [provider]  lisinopril (PRINIVIL,ZESTRIL) 40 MG tablet Take 40 mg by mouth daily.     Yes [provider]  metoprolol (TOPROL-XL) 50 MG 24 hr tablet Take 50 mg by mouth daily.    Yes [provider]  nitroGLYCERIN (NITROSTAT) 0.4 MG SL tablet DISSOLVE 1 TAB UNDER TOUNGE FOR CHEST PAIN. MAY REPEAT EVERY 5 MINUTES FOR 3 DOSES. IF NO RELIEF CALL 911 OR GO TO ER Patient taking differently: Place 0.4 mg under the tongue every 5 (five) minutes as needed for chest pain.  08/08/17  Yes Laqueta Linden, MD  ondansetron (ZOFRAN) 4 MG tablet Take 1 tablet (4 mg total) by mouth every 6 (six) hours. 01/16/18  Yes Triplett, Babette Relic, PA-C    Physical Exam: Vitals:   09/18/19 1147 09/18/19 1152  BP:  (!) 143/78  Pulse:  75  Resp:  18  Temp:  98.5 F (36.9 C)  TempSrc:  Oral  SpO2:  97%  Weight: 82.6 kg   Height: 5\' 5"  (1.651 m)     Constitutional: NAD, calm, comfortable Vitals:   09/18/19 1147 09/18/19 1152  BP:  (!) 143/78  Pulse:  75  Resp:  18  Temp:  98.5 F (36.9 C)  TempSrc:  Oral  SpO2:  97%  Weight: 82.6 kg   Height: 5\' 5"  (1.651 m)    Eyes: PERRL, lids and conjunctivae normal ENMT: Mucous membranes are moist.   Neck: normal, no masses, no thyromegaly Respiratory: clear to auscultation bilaterally, no wheezing, no crackles. Normal respiratory effort. No accessory muscle use.  Cardiovascular: Regular rate and rhythm, no murmurs / rubs / gallops. No extremity edema. 2+ pedal pulses.  Abdomen: no tenderness, no masses palpated. No hepatosplenomegaly. Bowel sounds positive.  Left Groin pain not worse with palpation, no swelling, masses, erythema warmth or discoloration. Musculoskeletal: no clubbing / cyanosis. No joint deformity upper and lower extremities. Good ROM, no contractures. Normal muscle tone.  Skin: Mild redness to left heel area, no erythema, no tenderness, no warmth, Neurologic:  No apparent cranial nerve abnormality, 5/5 strength bilateral upper extremity, right lower extremity 4/5, right lower extremity-patient unable to lift leg due to pain. Psychiatric: Normal judgment and insight. Alert and oriented x 3. Normal mood.   Labs on Admission: I have personally reviewed following labs and imaging studies  CBC: Recent Labs  Lab 09/18/19 1448  WBC 4.3  NEUTROABS 2.7  HGB 13.0  HCT 37.7  MCV 98.2  PLT 200   Basic Metabolic Panel: Recent Labs  Lab 09/18/19 1448  NA 135  K 4.3  CL 97*  CO2 27  GLUCOSE 104*  BUN 16  CREATININE 0.90  CALCIUM 9.6   Liver Function Tests: Recent Labs  Lab 09/18/19 1448  AST 15  ALT 16  ALKPHOS 58  BILITOT 1.2  PROT 7.5  ALBUMIN 4.2   Recent Labs  Lab 09/18/19 1448  LIPASE 28   Urine analysis:    Component Value Date/Time   COLORURINE YELLOW 09/18/2019 1437   APPEARANCEUR CLEAR 09/18/2019 1437   LABSPEC 1.013 09/18/2019 1437   PHURINE 6.0 09/18/2019 1437   GLUCOSEU NEGATIVE 09/18/2019 1437   HGBUR NEGATIVE 09/18/2019 1437   BILIRUBINUR NEGATIVE 09/18/2019 1437   KETONESUR NEGATIVE 09/18/2019 1437   PROTEINUR NEGATIVE 09/18/2019 1437   UROBILINOGEN 0.2 08/08/2014 0944   NITRITE NEGATIVE 09/18/2019 1437   LEUKOCYTESUR NEGATIVE 09/18/2019 1437    Radiological Exams on Admission: CT ABDOMEN PELVIS W CONTRAST  Result Date: 09/18/2019 CLINICAL DATA:  Left inguinal pain for 3 days EXAM: CT ABDOMEN AND PELVIS WITH CONTRAST TECHNIQUE: Multidetector CT imaging of the abdomen and pelvis was performed using the standard protocol following bolus administration of intravenous contrast. CONTRAST:  OMNIPAQUE IOHEXOL 300 MG/ML  SOLN COMPARISON:  01/08/2016 FINDINGS: Lower chest: No acute pleural or parenchymal lung disease. Hiatal and left diaphragmatic hernia unchanged. Hepatobiliary: No focal liver abnormality is seen. Status post cholecystectomy. No biliary dilatation. Pancreas: Unremarkable. No pancreatic ductal  dilatation or surrounding inflammatory changes. Spleen: Normal in size without focal abnormality. Adrenals/Urinary Tract: Adrenal glands are unremarkable. Kidneys are normal, without renal calculi, focal lesion, or hydronephrosis. Bladder is unremarkable. Stomach/Bowel: No bowel obstruction or ileus. Diverticulosis of the sigmoid colon without diverticulitis. No bowel wall thickening or inflammatory change. Vascular/Lymphatic: Aortic atherosclerosis. No enlarged abdominal or pelvic lymph nodes. Reproductive: Uterus and bilateral adnexa are unremarkable. Other: Stable bilateral fat containing inguinal hernias. No free fluid or free gas. Musculoskeletal: There are chronic compression deformities at the thoracolumbar junction, with previous vertebral augmentation at T12. Chronic appearing compression deformity through the inferior endplate of the L1 vertebral body is new since previous imaging. No acute fractures. Reconstructed images demonstrate no additional findings. IMPRESSION: 1. No acute intra-abdominal or intrapelvic process. 2. Sigmoid diverticulosis without diverticulitis. 3. Stable bilateral fat containing inguinal hernias as well as a large hiatal/left diaphragmatic hernia. 4. Aortic Atherosclerosis (ICD10-I70.0). Electronically Signed   By: Sharlet Salina M.D.   On: 09/18/2019 17:25    EKG: Independently reviewed.  Sinus rhythm, rate 69.  QTC 417.  No significant ST or T wave abnormalities compared to prior.  Assessment/Plan Principal Problem:   Groin pain, chronic, left Active Problems:   Essential hypertension, benign   Coronary atherosclerosis    Left groin pain-no improvement with home Norco, abdominal CT with contrast -no acute abnormality-abdomen no visualized vertebrae, stable bilateral fat-containing inguinal hernias, diverticulosis without diverticulitis.  Pelvic x-ray also without acute abnormality.  Requiring full 2 person assist in the ED, and still barely able to ambulate 4 steps  while using her walker.  Poor social support, patient lives with spouse who is 70 years old. -PT evaluation, may benefit from rehab -IV morphine as needed for pain -Lumbar spine MRI  -IV Dilaudid 0.5 every 4 hourly as needed for pain -Resume home gabapentin  Hypertension-stable. -Resume home lisinopril, metoprolol.  ?? Hx of atrial fibrillation, palpitations- listed on problem list, but no documentation of same.  Follows with cardiology.  Currently in in sinus rhythm.  Not on anticoagulation.  On metoprolol for palpitations. -Resume home metoprolol   History of CAD- s/p stent placement 1997.  Chronic back pain-  -Hold home Norco 5/325 3 times daily as needed, patient reports home pain  regimen not helping with pain.  May need dose adjustment on discharge. -Resume home gabapentin  IBS-- -resume dicyclomine   DVT prophylaxis: Lovenox Code Status: Full code, confirmed with patient at bedside. Family Communication: Spouse at bedside. Disposition Plan: 1 - 2 days Consults called: none Admission status:  Obs, med- surg.   Onnie Boer MD [facility]  09/18/2019, 9:59 PM

## 2019-09-18 NOTE — ED Provider Notes (Signed)
[facility] EMERGENCY DEPARTMENT Provider Note   CSN: [id number] Arrival date & time: 09/18/19  1138     History Chief Complaint  Patient presents with  . Groin Pain    Left    Dominique Matthews is a 86 y.o. female.  HPI      Dominique Matthews is a 85 y.o. female with past medical history of coronary artery disease, atrial fibrillation, diverticulitis, and chronic back pain.  She presents to the Emergency Department complaining of left groin and hip pain for 3 days.  She states that she has chronic low back pain and takes hydrocodone 3 times daily.  She states medication has not been helping her groin pain.  She complains of pain with movement of her left leg.  Pain radiates to her hip but not into her leg.  She denies fall, known injury, abdominal pain, fever, chills, urine or bowel changes, numbness of the leg.  She also reports some redness of her left heel and wishes to have this evaluated.  She states that she lives at home with her elderly husband who cares for her.  She states that she makes only a few steps at home with a walker at her baseline and mostly stays in bed.  She sees pain management in [place] for her back pain.      Past Medical History:  Diagnosis Date  . Atrial fibrillation (HCC)   . CAD (coronary artery disease)   . Cerebrovascular disease    Nonobstructive  . Chronic anxiety   . Chronic benign neutropenia (HCC)   . Diverticulitis   . Diverticulitis   . Dysrhythmia    AFib  . GERD (gastroesophageal reflux disease)   . History of supraventricular tachycardia   . Hypertension   . IBS (irritable bowel syndrome)   . Mixed dyslipidemia   . Obesity   . Osteoarthritis   . Palpitations   . PONV (postoperative nausea and vomiting)     Patient Active Problem List   Diagnosis Date Noted  . Groin pain, chronic, left 09/18/2019  . Diverticulitis of colon 04/02/2014  . Dyslipidemia 11/16/2010  . Essential hypertension, benign 04/21/2009  . Coronary  atherosclerosis 04/21/2009  . PALPITATIONS, HX OF 04/21/2009  . IRRITABLE BOWEL SYNDROME, HX OF 04/21/2009  . POSTSURG PERCUT TRANSLUMINAL COR ANGPLSTY STS 04/21/2009    Past Surgical History:  Procedure Laterality Date  . APPENDECTOMY    . BACK SURGERY    . Bone desinty  11/2015  . cardiac stent in 1997     at [facility]  . CARPAL TUNNEL RELEASE     Right; Bilateral trigger thumb release surgery  . CATARACT EXTRACTION W/PHACO Left 12/09/2017   Procedure: CATARACT EXTRACTION PHACO AND INTRAOCULAR LENS PLACEMENT LEFT EYE;  Surgeon: Fabio Pierce, MD;  Location: [facility];  Service: Ophthalmology;  Laterality: Left;  CDE: 37.61  . CHOLECYSTECTOMY    . CORONARY ANGIOPLASTY     stent  . ESOPHAGOGASTRODUODENOSCOPY N/A 05/17/2014   Procedure: ESOPHAGOGASTRODUODENOSCOPY (EGD);  Surgeon: Malissa Hippo, MD;  Location: [facility] SUITE;  Service: Endoscopy;  Laterality: N/A;  730  . KNEE ARTHROSCOPY Left   . THORACIC DISC SURGERY  11/03/2015   T 12  . TOTAL KNEE ARTHROPLASTY Right      OB History   No obstetric history on file.     Family History  Problem Relation Age of Onset  . Coronary artery disease Other     Social History   Tobacco Use  .  Smoking status: Never Smoker  . Smokeless tobacco: Never Used  Vaping Use  . Vaping Use: Never used  Substance Use Topics  . Alcohol use: No    Alcohol/week: 0.0 standard drinks  . Drug use: No    Home Medications Prior to Admission medications   Medication Sig Start Date End Date Taking? Authorizing Provider  acetaminophen (TYLENOL) 500 MG tablet Take 500 mg by mouth every 6 (six) hours as needed for moderate pain or headache.    Yes [provider]  dicyclomine (BENTYL) 10 MG capsule Take 20 mg by mouth 2 (two) times daily.    Yes [provider]  esomeprazole (NEXIUM) 20 MG capsule Take 20 mg by mouth daily.    Yes [provider]  gabapentin (NEURONTIN) 300 MG capsule Take 300 mg by mouth 3 (three)  times daily.     Yes [provider]  HYDROcodone-acetaminophen (NORCO/VICODIN) 5-325 MG tablet Take 1 tablet by mouth 3 (three) times daily as needed for moderate pain.   Yes [provider]  lisinopril (PRINIVIL,ZESTRIL) 40 MG tablet Take 40 mg by mouth daily.     Yes [provider]  metoprolol (TOPROL-XL) 50 MG 24 hr tablet Take 50 mg by mouth daily.    Yes [provider]  nitroGLYCERIN (NITROSTAT) 0.4 MG SL tablet DISSOLVE 1 TAB UNDER TOUNGE FOR CHEST PAIN. MAY REPEAT EVERY 5 MINUTES FOR 3 DOSES. IF NO RELIEF CALL 911 OR GO TO ER Patient taking differently: Place 0.4 mg under the tongue every 5 (five) minutes as needed for chest pain.  08/08/17  Yes Laqueta Linden, MD  ondansetron (ZOFRAN) 4 MG tablet Take 1 tablet (4 mg total) by mouth every 6 (six) hours. 01/16/18  Yes Timmy Bubeck, PA-C    Allergies    Codeine and Morphine  Review of Systems   Review of Systems  Constitutional: Negative for chills and fever.  Respiratory: Negative for shortness of breath.   Cardiovascular: Negative for chest pain.  Gastrointestinal: Negative for abdominal pain, diarrhea, nausea and vomiting.  Genitourinary: Negative for difficulty urinating, dysuria and flank pain.  Musculoskeletal: Positive for arthralgias (left hip and groin pain) and back pain. Negative for joint swelling.  Skin: Negative for color change and wound.  Neurological: Negative for dizziness, syncope, weakness, numbness and headaches.    Physical Exam Updated Vital Signs BP (!) 143/78 (BP Location: Right Arm)   Pulse 75   Temp 98.5 F (36.9 C) (Oral)   Resp 18   Ht 5\' 5"  (1.651 m)   Wt 82.6 kg   SpO2 97%   BMI 30.29 kg/m   Physical Exam Vitals and nursing note reviewed.  Constitutional:      General: She is not in acute distress.    Appearance: Normal appearance.  HENT:     Head: Atraumatic.     Mouth/Throat:     Mouth: Mucous membranes are moist.  Cardiovascular:      Rate and Rhythm: Normal rate and regular rhythm.     Pulses: Normal pulses.  Pulmonary:     Effort: Pulmonary effort is normal.     Breath sounds: Normal breath sounds.  Chest:     Chest wall: No tenderness.  Abdominal:     General: There is no distension.     Palpations: Abdomen is soft.     Tenderness: There is no abdominal tenderness. There is no guarding.  Musculoskeletal:        General: Tenderness present.  Cervical back: Normal range of motion.     Comments: ttp of the left lower lumbar paraspinal muscles and SI joint space.  Tender along the left groin.  No rash or edema.    Skin:    General: Skin is warm.     Capillary Refill: Capillary refill takes less than 2 seconds.     Findings: No rash.     Comments: Patient has some mild erythema of the left heel.  No ulcer.  Dorsalis pedis and posterior tibial pulses are palpable.  Foot is warm and pink.  Neurological:     General: No focal deficit present.     Mental Status: She is alert.     Sensory: No sensory deficit.     Motor: No weakness.     ED Results / Procedures / Treatments   Labs (all labs ordered are listed, but only abnormal results are displayed) Labs Reviewed  COMPREHENSIVE METABOLIC PANEL - Abnormal; Notable for the following components:      Result Value   Chloride 97 (*)    Glucose, Bld 104 (*)    GFR calc non Af Amer 57 (*)    All other components within normal limits  CBC WITH DIFFERENTIAL/PLATELET - Abnormal; Notable for the following components:   RBC 3.84 (*)    All other components within normal limits  SARS CORONAVIRUS 2 BY RT PCR (HOSPITAL ORDER, PERFORMED IN [facility])  LIPASE, BLOOD  URINALYSIS, ROUTINE W REFLEX MICROSCOPIC    EKG None  Radiology CT ABDOMEN PELVIS W CONTRAST  Result Date: 09/18/2019 CLINICAL DATA:  Left inguinal pain for 3 days EXAM: CT ABDOMEN AND PELVIS WITH CONTRAST TECHNIQUE: Multidetector CT imaging of the abdomen and pelvis was performed using  the standard protocol following bolus administration of intravenous contrast. CONTRAST:  OMNIPAQUE IOHEXOL 300 MG/ML  SOLN COMPARISON:  01/08/2016 FINDINGS: Lower chest: No acute pleural or parenchymal lung disease. Hiatal and left diaphragmatic hernia unchanged. Hepatobiliary: No focal liver abnormality is seen. Status post cholecystectomy. No biliary dilatation. Pancreas: Unremarkable. No pancreatic ductal dilatation or surrounding inflammatory changes. Spleen: Normal in size without focal abnormality. Adrenals/Urinary Tract: Adrenal glands are unremarkable. Kidneys are normal, without renal calculi, focal lesion, or hydronephrosis. Bladder is unremarkable. Stomach/Bowel: No bowel obstruction or ileus. Diverticulosis of the sigmoid colon without diverticulitis. No bowel wall thickening or inflammatory change. Vascular/Lymphatic: Aortic atherosclerosis. No enlarged abdominal or pelvic lymph nodes. Reproductive: Uterus and bilateral adnexa are unremarkable. Other: Stable bilateral fat containing inguinal hernias. No free fluid or free gas. Musculoskeletal: There are chronic compression deformities at the thoracolumbar junction, with previous vertebral augmentation at T12. Chronic appearing compression deformity through the inferior endplate of the L1 vertebral body is new since previous imaging. No acute fractures. Reconstructed images demonstrate no additional findings. IMPRESSION: 1. No acute intra-abdominal or intrapelvic process. 2. Sigmoid diverticulosis without diverticulitis. 3. Stable bilateral fat containing inguinal hernias as well as a large hiatal/left diaphragmatic hernia. 4. Aortic Atherosclerosis (ICD10-I70.0). Electronically Signed   By: Sharlet Salina M.D.   On: 09/18/2019 17:25   DG Hip Unilat W or Wo Pelvis 2-3 Views Left  Result Date: 09/18/2019 CLINICAL DATA:  86 year old female with left hip and groin pain for 3 days, no known injury. EXAM: DG HIP (WITH OR WITHOUT PELVIS) 2-3V LEFT  COMPARISON:  CT Abdomen and Pelvis 1655 hours today. Left hip series 04/11/2019. FINDINGS: Supine views. Excreted IV contrast in the urinary bladder from recent CT. Osteopenia. Femoral heads normally located,  and pelvis and proximal femurs appear intact as on the earlier CT today. Symmetric hip joint space loss. No acute osseous abnormality identified. Stable visible bowel gas pattern. IMPRESSION: No acute osseous abnormality identified. Electronically Signed   By: Odessa Fleming M.D.   On: 09/18/2019 21:42    Procedures Procedures (including critical care time)  Medications Ordered in ED Medications  fentaNYL (SUBLIMAZE) injection 50 mcg (50 mcg Intravenous Given 09/18/19 1509)  iohexol (OMNIPAQUE) 300 MG/ML solution 100 mL (100 mLs Intravenous Contrast Given 09/18/19 1652)  LORazepam (ATIVAN) injection 1 mg (1 mg Intravenous Given 09/18/19 1721)  HYDROmorphone (DILAUDID) injection 0.5 mg (0.5 mg Intravenous Given 09/18/19 2202)    ED Course  I have reviewed the triage vital signs and the nursing notes.  Pertinent labs & imaging results that were available during my care of the patient were reviewed by me and considered in my medical decision making (see chart for details).    MDM Rules/Calculators/A&P                          Patient is elderly female with history of chronic back pain, follows pain management in [place].  Here with 3-day history of pain of her left hip and groin area.  Patient is mostly nonambulatory at baseline although she does admit to occasionally making few steps with her walker.  She is cared for by her elderly husband.  No history of fall.  She is neurovascularly intact.  Will obtain labs and CT of the abdomen pelvis.  Labs unremarkable.  CT of the abdomen pelvis is without acute findings.  No UTI.  Patient well-appearing and nontoxic.  Patient unable to ambulate without significant pain.  Plain film of the hip is negative for acute bony process.  Discussed admission for  possible SNF placement but patient and spouse do not prefer nursing home placement.  Consulted hospitalist who agrees to admit   Final Clinical Impression(s) / ED Diagnoses Final diagnoses:  Left groin pain    Rx / DC Orders ED Discharge Orders    None       Rosey Bath 09/18/19 2235    Bethann Berkshire, MD 09/19/19 [phone]

## 2019-09-18 NOTE — ED Triage Notes (Signed)
Patient also has red area to left heel, she would like examined.

## 2019-09-18 NOTE — ED Triage Notes (Signed)
Patient is having left groin pain x 3 days, currently in Pain Management clinic in [place], took 3 Hydrocodone without any relief.

## 2019-09-18 NOTE — ED Notes (Signed)
Pt ambulated 4 small steps with 2 person assist and use of a walker.

## 2019-09-19 ENCOUNTER — Observation Stay (HOSPITAL_COMMUNITY): Payer: Medicare PPO

## 2019-09-19 ENCOUNTER — Other Ambulatory Visit: Payer: Self-pay

## 2019-09-19 DIAGNOSIS — K219 Gastro-esophageal reflux disease without esophagitis: Secondary | ICD-10-CM | POA: Diagnosis present

## 2019-09-19 DIAGNOSIS — R330 Drug induced retention of urine: Secondary | ICD-10-CM | POA: Diagnosis present

## 2019-09-19 DIAGNOSIS — E782 Mixed hyperlipidemia: Secondary | ICD-10-CM | POA: Diagnosis present

## 2019-09-19 DIAGNOSIS — G8929 Other chronic pain: Secondary | ICD-10-CM

## 2019-09-19 DIAGNOSIS — R531 Weakness: Secondary | ICD-10-CM | POA: Diagnosis present

## 2019-09-19 DIAGNOSIS — Z20822 Contact with and (suspected) exposure to covid-19: Secondary | ICD-10-CM | POA: Diagnosis present

## 2019-09-19 DIAGNOSIS — I10 Essential (primary) hypertension: Secondary | ICD-10-CM | POA: Diagnosis present

## 2019-09-19 DIAGNOSIS — R1032 Left lower quadrant pain: Secondary | ICD-10-CM | POA: Diagnosis present

## 2019-09-19 DIAGNOSIS — R269 Unspecified abnormalities of gait and mobility: Secondary | ICD-10-CM | POA: Diagnosis present

## 2019-09-19 DIAGNOSIS — M8938 Hypertrophy of bone, other site: Secondary | ICD-10-CM | POA: Diagnosis present

## 2019-09-19 DIAGNOSIS — K5903 Drug induced constipation: Secondary | ICD-10-CM | POA: Diagnosis present

## 2019-09-19 DIAGNOSIS — T400X5A Adverse effect of opium, initial encounter: Secondary | ICD-10-CM | POA: Diagnosis present

## 2019-09-19 DIAGNOSIS — Z638 Other specified problems related to primary support group: Secondary | ICD-10-CM | POA: Diagnosis not present

## 2019-09-19 DIAGNOSIS — Z955 Presence of coronary angioplasty implant and graft: Secondary | ICD-10-CM | POA: Diagnosis not present

## 2019-09-19 DIAGNOSIS — Z885 Allergy status to narcotic agent status: Secondary | ICD-10-CM | POA: Diagnosis not present

## 2019-09-19 DIAGNOSIS — M48061 Spinal stenosis, lumbar region without neurogenic claudication: Secondary | ICD-10-CM | POA: Diagnosis present

## 2019-09-19 DIAGNOSIS — G894 Chronic pain syndrome: Secondary | ICD-10-CM | POA: Diagnosis present

## 2019-09-19 DIAGNOSIS — I2510 Atherosclerotic heart disease of native coronary artery without angina pectoris: Secondary | ICD-10-CM | POA: Diagnosis present

## 2019-09-19 DIAGNOSIS — I679 Cerebrovascular disease, unspecified: Secondary | ICD-10-CM | POA: Diagnosis present

## 2019-09-19 DIAGNOSIS — Z96651 Presence of right artificial knee joint: Secondary | ICD-10-CM | POA: Diagnosis present

## 2019-09-19 DIAGNOSIS — K589 Irritable bowel syndrome without diarrhea: Secondary | ICD-10-CM | POA: Diagnosis present

## 2019-09-19 DIAGNOSIS — Z79899 Other long term (current) drug therapy: Secondary | ICD-10-CM | POA: Diagnosis not present

## 2019-09-19 DIAGNOSIS — Z8249 Family history of ischemic heart disease and other diseases of the circulatory system: Secondary | ICD-10-CM | POA: Diagnosis not present

## 2019-09-19 DIAGNOSIS — Z8673 Personal history of transient ischemic attack (TIA), and cerebral infarction without residual deficits: Secondary | ICD-10-CM | POA: Diagnosis not present

## 2019-09-19 DIAGNOSIS — R002 Palpitations: Secondary | ICD-10-CM | POA: Diagnosis present

## 2019-09-19 LAB — SEDIMENTATION RATE: Sed Rate: 17 mm/hr (ref 0–22)

## 2019-09-19 LAB — C-REACTIVE PROTEIN: CRP: 0.5 mg/dL (ref <1.0)

## 2019-09-19 LAB — CK: Total CK: 181 U/L (ref 38–234)

## 2019-09-19 MED ORDER — LORAZEPAM 2 MG/ML IJ SOLN
0.5000 mg | Freq: Once | INTRAMUSCULAR | Status: AC
  Administered 2019-09-19: 0.5 mg via INTRAVENOUS
  Filled 2019-09-19: qty 1

## 2019-09-19 MED ORDER — HYDROCODONE-ACETAMINOPHEN 7.5-325 MG PO TABS
1.0000 | ORAL_TABLET | ORAL | Status: DC | PRN
  Administered 2019-09-19 – 2019-09-20 (×4): 1 via ORAL
  Filled 2019-09-19 (×4): qty 1

## 2019-09-19 MED ORDER — ASPIRIN EC 81 MG PO TBEC
81.0000 mg | DELAYED_RELEASE_TABLET | Freq: Every day | ORAL | Status: DC
  Administered 2019-09-19 – 2019-09-21 (×3): 81 mg via ORAL
  Filled 2019-09-19 (×3): qty 1

## 2019-09-19 MED ORDER — METHOCARBAMOL 500 MG PO TABS
500.0000 mg | ORAL_TABLET | Freq: Three times a day (TID) | ORAL | Status: DC
  Administered 2019-09-19 – 2019-09-21 (×7): 500 mg via ORAL
  Filled 2019-09-19 (×7): qty 1

## 2019-09-19 NOTE — Evaluation (Signed)
Physical Therapy Evaluation Patient Details Name: Dominique Matthews MRN: [id number] DOB: 04-Mar-1930 Today's Date: 09/19/2019   History of Present Illness  Dominique Matthews is a 86 y.o. female with medical history significant for hypertension, atrial fibrillation, CVA, coronary artery disease, chronic pain.Patient presented to the ED with complaints of left groin pain ongoing over the past 3 days, that radiates down her left lower extremity.  Complains of some redness to her left heel.  Patient reported that she took a total of 3 tablets Q8h of her oxycodone, without significant improvement in her pain.  She denies falls.  Patient has chronic back pain, which is uncontrolled with her home pain regimen.  She has had several back injections. Spouse is present at bedside, he says he has to help patient out of bed in the mornings, but this is not new.  At baseline patient ambulates with a walker, and she is able to move around her house.    Clinical Impression  Patient demonstrates slow labored movement for sitting up at bedside, has difficulty completing sit to stands and limited to a couple of shuffling side steps at bedside due to fall risk and generalized weakness.  Patient taken for a procedure via gurney after therapy.  Patient will benefit from continued physical therapy in hospital and recommended venue below to increase strength, balance, endurance for safe ADLs and gait.     Follow Up Recommendations SNF    Equipment Recommendations  None recommended by PT    Recommendations for Other Services       Precautions / Restrictions Precautions Precautions: Fall Restrictions Weight Bearing Restrictions: No      Mobility  Bed Mobility Overal bed mobility: Needs Assistance Bed Mobility: Supine to Sit;Sit to Supine     Supine to sit: Max assist Sit to supine: Max assist   General bed mobility comments: slow labored movement with c/o increased pain left groin area  Transfers Overall  transfer level: Needs assistance Equipment used: Rolling walker (2 wheeled) Transfers: Sit to/from Stand Sit to Stand: Max assist         General transfer comment: unsteady on feet with difficulty completing sit to stands due to weakness  Ambulation/Gait Ambulation/Gait assistance: Max assist Gait Distance (Feet): 2 Feet Assistive device: Rolling walker (2 wheeled) Gait Pattern/deviations: Decreased step length - right;Decreased step length - left;Decreased stride length;Shuffle Gait velocity: slow   General Gait Details: limited to a couple shuffling side steps due to weakness and poor standing balance  Stairs            Wheelchair Mobility    Modified Rankin (Stroke Patients Only)       Balance Overall balance assessment: Needs assistance Sitting-balance support: Feet supported;No upper extremity supported Sitting balance-Leahy Scale: Fair Sitting balance - Comments: seated at EOB   Standing balance support: During functional activity;Bilateral upper extremity supported Standing balance-Leahy Scale: Poor Standing balance comment: using RW                             Pertinent Vitals/Pain Pain Assessment: 0-10 Pain Score: 8  Pain Location: left groin area Pain Descriptors / Indicators: Sore;Discomfort;Grimacing;Guarding Pain Intervention(s): Limited activity within patient's tolerance;Monitored during session;Premedicated before session    Home Living Family/patient expects to be discharged to:: Private residence Living Arrangements: Spouse/significant other Available Help at Discharge: Family;Available 24 hours/day Type of Home: House Home Access: Ramped entrance     Home Layout: One level Home  Equipment: Dan Humphreys - 2 wheels;Wheelchair - manual;Shower seat;Bedside commode;Cane - single point      Prior Function Level of Independence: Needs assistance   Gait / Transfers Assistance Needed: Assisted transfers, short distanced household  ambulator with Supervision  ADL's / Homemaking Assistance Needed: assisted by family        Hand Dominance        Extremity/Trunk Assessment   Upper Extremity Assessment Upper Extremity Assessment: Generalized weakness    Lower Extremity Assessment Lower Extremity Assessment: Generalized weakness    Cervical / Trunk Assessment Cervical / Trunk Assessment: Kyphotic  Communication   Communication: No difficulties  Cognition Arousal/Alertness: Awake/alert Behavior During Therapy: WFL for tasks assessed/performed Overall Cognitive Status: Within Functional Limits for tasks assessed                                        General Comments      Exercises     Assessment/Plan    PT Assessment Patient needs continued PT services  PT Problem List Decreased strength;Decreased activity tolerance;Decreased balance;Decreased mobility       PT Treatment Interventions Gait training;Functional mobility training;Therapeutic activities;Therapeutic exercise;Patient/family education;Stair training    PT Goals (Current goals can be found in the Care Plan section)  Acute Rehab PT Goals Patient Stated Goal: return home with family to assist PT Goal Formulation: With patient Time For Goal Achievement: 10/03/19 Potential to Achieve Goals: Good    Frequency Min 3X/week   Barriers to discharge        Co-evaluation               AM-PAC PT "6 Clicks" Mobility  Outcome Measure Help needed turning from your back to your side while in a flat bed without using bedrails?: A Lot Help needed moving from lying on your back to sitting on the side of a flat bed without using bedrails?: A Lot Help needed moving to and from a bed to a chair (including a wheelchair)?: A Lot Help needed standing up from a chair using your arms (e.g., wheelchair or bedside chair)?: A Lot Help needed to walk in hospital room?: Total Help needed climbing 3-5 steps with a railing? : Total 6  Click Score: 10    End of Session   Activity Tolerance: Patient tolerated treatment well;Patient limited by fatigue Patient left: in bed;with call bell/phone within reach;Other (comment) (patient taken to procedure via gurny after therapy) Nurse Communication: Mobility status PT Visit Diagnosis: Unsteadiness on feet (R26.81);Other abnormalities of gait and mobility (R26.89);Muscle weakness (generalized) (M62.81)    Time: 9450-3888 PT Time Calculation (min) (ACUTE ONLY): 22 min   Charges:   PT Evaluation $PT Eval Moderate Complexity: 1 Mod PT Treatments $Therapeutic Activity: 8-22 mins        9:55 AM, 09/19/19 Ocie Bob, MPT Physical Therapist with [facility] [phone] office [phone] mobile phone

## 2019-09-19 NOTE — Progress Notes (Signed)
Patient Demographics:    Dominique Matthews, is a 86 y.o. female, DOB - 1929-11-22, MLY:[id number]  Admit date - 09/18/2019   Admitting Physician Ejiroghene Arlyce Dice, MD  Outpatient Primary MD for the patient is Rory Percy, MD  LOS - 0   Chief Complaint  Patient presents with  . Groin Pain    Left        Subjective:    Dominique Matthews today has no fevers, no emesis,  No chest pain,   -- -Granddaughter Chrisann at bedside, patient complains of excruciating pain 10 out of 10 --Pain control remains very very challenging impacting patient's ability to perform mobility related activities of daily living -Use IV Dilaudid as needed  Assessment  & Plan :    Principal Problem:   Groin pain, chronic, left Active Problems:   Essential hypertension, benign   Coronary atherosclerosis  Brief Summary:-  86 year old  Female with medical history significant for HTN, Chronic Afib, h/o CVA, CAD and coronary artery disease, chronic pain syndrome on chronic pain meds from pain clinic admitted on 10/05/2018 now with worsening left groin pain and inability to perform ADLs safely at home  A/p 1)Lt Groin Pain--- in the setting of chronic pain syndrome, total CK, CRP and ESR are not elevated -MRI lumbar spine with foraminal narrowing at L5-S1 and multifactorial stenosis at L3-L4 and L4-L5 as well as multilevel facet hypertrophy--- no acute findings however -CT abdomen and pelvis and hip x-rays without acute findings -Physical therapy recommends SNF rehab due to severe pain and inability to perform ADLs -Pain control remains very very challenging impacting patient's ability to perform mobility related activities of daily living -Use IV Dilaudid as needed --Increase Vicodin to 7.5/325 mg prn C/n methocarbamol   2)PAFib--PTA was not on anticoagulation, currently in sinus rhythm -Continue metoprolol for rate  control/palpitations   3)Generalized Weakness and ambulatory dysfunction-due to significant left groin pain, physical therapist recommends SNF rehab  4)CAD--- status post prior angioplasty and stent placement in 1997, chest pain-free at this time no ACS type symptoms stable, continue metoprolol -Give aspirin with food, hold off on statins due to myalgias  5) chronic pain syndrome including back pain--- please see MRI lumbar spine report below-----patient goes to pain clinic in [place], multiple injections in the past  6)HTN--stable, okay to continue Toprol-XL 50 mg daily and lisinopril  7)Social/Ethics--patient lives with her 61 year old husband, other for members live close by but patient is having difficulties with ADLs due to pain -Patient and family to decide on possible transfer to SNF rehab -She is a full code  Disposition/Need for in-Hospital Stay- patient unable to be discharged at this time due to ---Pain control remains very very challenging impacting patient's ability to perform mobility related activities of daily living -Use IV Dilaudid as needed  Status is: Inpatient  Remains inpatient appropriate because:-Pain control remains very very challenging impacting patient's ability to perform mobility related activities of daily living   Disposition: The patient is from: Home              Anticipated d/c is to: SNF              Anticipated d/c date is: 1 day  Patient currently is not medically stable to d/c. Barriers: Not Clinically Stable- -Pain control remains very very challenging impacting patient's ability to perform mobility related activities of daily living -Use IV Dilaudid as needed  Code Status : Full  Family Communication:   (patient is alert, awake and coherent) -husband and granddaughter at bedside  Consults  :  na  DVT Prophylaxis  :  Lovenox -   SCDs   Lab Results  Component Value Date   PLT 200 09/18/2019    Inpatient  Medications  Scheduled Meds: . dicyclomine  20 mg Oral BID  . enoxaparin (LOVENOX) injection  40 mg Subcutaneous QHS  . gabapentin  300 mg Oral TID  . lisinopril  40 mg Oral Daily  . methocarbamol  500 mg Oral TID  . metoprolol succinate  50 mg Oral Daily  . pantoprazole  40 mg Oral Daily   Continuous Infusions: PRN Meds:.acetaminophen **OR** acetaminophen, HYDROcodone-acetaminophen, HYDROmorphone (DILAUDID) injection, ondansetron **OR** ondansetron (ZOFRAN) IV, polyethylene glycol    Anti-infectives (From admission, onward)   None        Objective:   Vitals:   09/19/19 0325 09/19/19 0633 09/19/19 0700 09/19/19 1121  BP: 129/72 (!) 149/79 135/79 132/79  Pulse: 73 78 79 80  Resp:   16   Temp: 97.7 F (36.5 C) 97.8 F (36.6 C) 98.1 F (36.7 C)   TempSrc:   Oral   SpO2: 96% 96% 96%   Weight:      Height:        Wt Readings from Last 3 Encounters:  09/18/19 82.6 kg  07/23/19 81.6 kg  04/11/19 81 kg     Intake/Output Summary (Last 24 hours) at 09/19/2019 1643 Last data filed at 09/19/2019 0644 Gross per 24 hour  Intake --  Output 200 ml  Net -200 ml    Physical Exam  Gen:- Awake Alert, uncomfortable with pain HEENT:- [place].AT, No sclera icterus Neck-Supple Neck,No JVD,.  Lungs-  CTAB , fair symmetrical air movement CV- S1, S2 normal, regular  Abd-  +ve B.Sounds, Abd Soft, No tenderness,    Extremity/Skin:- No  edema, pedal pulses present  Psych-affect is appropriate, oriented x3 Neuro-no new focal deficits, no tremors MSK-left groin area with significant pain/tenderness overlying skin without erythema warmth swelling ecchymosis or streaking   Data Review:   Micro Results Recent Results (from the past 240 hour(s))  SARS Coronavirus 2 by RT PCR (hospital order, performed in [facility] hospital lab) Nasopharyngeal Nasopharyngeal Swab     Status: None   Collection Time: 09/18/19  9:58 PM   Specimen: Nasopharyngeal Swab  Result Value Ref Range Status   SARS  Coronavirus 2 NEGATIVE NEGATIVE Final    Comment: (NOTE) SARS-CoV-2 target nucleic acids are NOT DETECTED.  The SARS-CoV-2 RNA is generally detectable in upper and lower respiratory specimens during the acute phase of infection. The lowest concentration of SARS-CoV-2 viral copies this assay can detect is 250 copies / mL. A negative result does not preclude SARS-CoV-2 infection and should not be used as the sole basis for treatment or other patient management decisions.  A negative result may occur with improper specimen collection / handling, submission of specimen other than nasopharyngeal swab, presence of viral mutation(s) within the areas targeted by this assay, and inadequate number of viral copies (<250 copies / mL). A negative result must be combined with clinical observations, patient history, and epidemiological information.  Fact Sheet for Patients:   StrictlyIdeas.no  Fact Sheet for Healthcare Providers:  BankingDealers.co.za  This test is not yet approved or  cleared by the [place] and has been authorized for detection and/or diagnosis of SARS-CoV-2 by FDA under an Emergency Use Authorization (EUA).  This EUA will remain in effect (meaning this test can be used) for the duration of the COVID-19 declaration under Section 564(b)(1) of the Act, 21 U.S.C. section 360bbb-3(b)(1), unless the authorization is terminated or revoked sooner.  Performed at [facility], [address], [place], [place] [address]     Radiology Reports MR LUMBAR SPINE WO CONTRAST  Result Date: 09/19/2019 CLINICAL DATA:  Chronic back pain. Left groin pain radiating to the left leg and foot. EXAM: MRI LUMBAR SPINE WITHOUT CONTRAST TECHNIQUE: Multiplanar, multisequence MR imaging of the lumbar spine was performed. No intravenous contrast was administered. COMPARISON:  Radiography 01/16/2018.  CT abdomen 01/08/2016. FINDINGS: Segmentation:  5  lumbar type vertebral bodies. Alignment: 2 mm retrolisthesis T12-L1. 3 mm retrolisthesis L1-2. 2 mm anterolisthesis L4-5. Increase thoracolumbar kyphotic curvature and somewhat exaggerated lumbar lordosis. Vertebrae: Old augmented fracture at T12 which is healed. Old partial compression fracture at L1 affecting the inferior endplate which is healed. Loss of height at T12 approximately 30%. Loss of height at L1 approximately 20-30%. Conus medullaris and cauda equina: Conus extends to the L1 level. Conus and cauda equina appear normal. Paraspinal and other soft tissues: Negative Disc levels: T11-12: Endplate osteophytes and mild bulging of the disc. Narrowing of the thecal sac slightly but no compressive stenosis. T12-L1: 2 mm retrolisthesis. Bulging of the disc. Narrowing of the thecal sac but no compressive stenosis. L1-2: 3 mm retrolisthesis. Bulging of the disc. Facet hypertrophy. Narrowing of both lateral recesses and neural foramina but without distinct neural compression. L2-3: Disc degeneration with endplate osteophytes and bulging of the disc. Facet and ligamentous hypertrophy. Lateral recess and foraminal stenosis right worse than left. Moderate central canal stenosis. Neural compression could possibly occur at this level, more likely on the right. L3-4: Bulging of the disc. Bilateral facet and ligamentous hypertrophy. Multifactorial spinal stenosis that could cause neural compression on either or both sides. L4-5: Bulging of the disc. Bilateral facet degeneration. 2 mm anterolisthesis. Stenosis of the lateral recesses and foramina that could cause neural compression on either side. L5-S1: No disc bulge or herniation. Left posterolateral endplate osteophytes and bulging of the disc. Facet degeneration and hypertrophy. Subarticular lateral recess and foraminal narrowing on the left that could possibly cause neural compression. IMPRESSION: Old healed augmented fracture at T12.  Old healed fracture at L1. L1-2:  3 mm retrolisthesis. Bulging of the disc. Facet hypertrophy. Stenosis of both lateral recesses and foramina. L2-3: Endplate osteophytes and bulging of the disc. Facet hypertrophy. Lateral recess and foraminal stenosis right worse than left that could be symptomatic. L3-4: Multifactorial stenosis that could be symptomatic. L4-5: Multifactorial stenosis that could be symptomatic. L5-S1: Left lateral recess and foraminal narrowing because of encroachment by osteophyte in disc material that could cause left-sided neural compression. Electronically Signed   By: Nelson Chimes M.D.   On: 09/19/2019 09:04   CT ABDOMEN PELVIS W CONTRAST  Result Date: 09/18/2019 CLINICAL DATA:  Left inguinal pain for 3 days EXAM: CT ABDOMEN AND PELVIS WITH CONTRAST TECHNIQUE: Multidetector CT imaging of the abdomen and pelvis was performed using the standard protocol following bolus administration of intravenous contrast. CONTRAST:  147m OMNIPAQUE IOHEXOL 300 MG/ML  SOLN COMPARISON:  01/08/2016 FINDINGS: Lower chest: No acute pleural or parenchymal lung disease. Hiatal and left diaphragmatic hernia unchanged. Hepatobiliary:  No focal liver abnormality is seen. Status post cholecystectomy. No biliary dilatation. Pancreas: Unremarkable. No pancreatic ductal dilatation or surrounding inflammatory changes. Spleen: Normal in size without focal abnormality. Adrenals/Urinary Tract: Adrenal glands are unremarkable. Kidneys are normal, without renal calculi, focal lesion, or hydronephrosis. Bladder is unremarkable. Stomach/Bowel: No bowel obstruction or ileus. Diverticulosis of the sigmoid colon without diverticulitis. No bowel wall thickening or inflammatory change. Vascular/Lymphatic: Aortic atherosclerosis. No enlarged abdominal or pelvic lymph nodes. Reproductive: Uterus and bilateral adnexa are unremarkable. Other: Stable bilateral fat containing inguinal hernias. No free fluid or free gas. Musculoskeletal: There are chronic compression  deformities at the thoracolumbar junction, with previous vertebral augmentation at T12. Chronic appearing compression deformity through the inferior endplate of the L1 vertebral body is new since previous imaging. No acute fractures. Reconstructed images demonstrate no additional findings. IMPRESSION: 1. No acute intra-abdominal or intrapelvic process. 2. Sigmoid diverticulosis without diverticulitis. 3. Stable bilateral fat containing inguinal hernias as well as a large hiatal/left diaphragmatic hernia. 4. Aortic Atherosclerosis (ICD10-I70.0). Electronically Signed   By: Randa Ngo M.D.   On: 09/18/2019 17:25   DG Hip Unilat W or Wo Pelvis 2-3 Views Left  Result Date: 09/18/2019 CLINICAL DATA:  86 year old female with left hip and groin pain for 3 days, no known injury. EXAM: DG HIP (WITH OR WITHOUT PELVIS) 2-3V LEFT COMPARISON:  CT Abdomen and Pelvis 1655 hours today. Left hip series 04/11/2019. FINDINGS: Supine views. Excreted IV contrast in the urinary bladder from recent CT. Osteopenia. Femoral heads normally located, and pelvis and proximal femurs appear intact as on the earlier CT today. Symmetric hip joint space loss. No acute osseous abnormality identified. Stable visible bowel gas pattern. IMPRESSION: No acute osseous abnormality identified. Electronically Signed   By: Genevie Ann M.D.   On: 09/18/2019 21:42     CBC Recent Labs  Lab 09/18/19 1448  WBC 4.3  HGB 13.0  HCT 37.7  PLT 200  MCV 98.2  MCH 33.9  MCHC 34.5  RDW 11.8  LYMPHSABS 1.2  MONOABS 0.3  EOSABS 0.1  BASOSABS 0.0    Chemistries  Recent Labs  Lab 09/18/19 1448  NA 135  K 4.3  CL 97*  CO2 27  GLUCOSE 104*  BUN 16  CREATININE 0.90  CALCIUM 9.6  AST 15  ALT 16  ALKPHOS 58  BILITOT 1.2   ------------------------------------------------------------------------------------------------------------------ No results for input(s): CHOL, HDL, LDLCALC, TRIG, CHOLHDL, LDLDIRECT in the last 72 hours.  No results  found for: HGBA1C ------------------------------------------------------------------------------------------------------------------ No results for input(s): TSH, T4TOTAL, T3FREE, THYROIDAB in the last 72 hours.  Invalid input(s): FREET3 ------------------------------------------------------------------------------------------------------------------ No results for input(s): VITAMINB12, FOLATE, FERRITIN, TIBC, IRON, RETICCTPCT in the last 72 hours.  Coagulation profile No results for input(s): INR, PROTIME in the last 168 hours.  No results for input(s): DDIMER in the last 72 hours.  Cardiac Enzymes No results for input(s): CKMB, TROPONINI, MYOGLOBIN in the last 168 hours.  Invalid input(s): CK ------------------------------------------------------------------------------------------------------------------ No results found for: BNP   Roxan Hockey M.D on 09/19/2019 at 4:43 PM  Go to [email or web address] - for contact info  [facility] - Office  [phone]

## 2019-09-19 NOTE — Plan of Care (Signed)
  Problem: Acute Rehab PT Goals(only PT should resolve) Goal: Pt Will Go Supine/Side To Sit Outcome: Progressing Flowsheets (Taken 09/19/2019 0956) Pt will go Supine/Side to Sit:  with minimal assist  with moderate assist Goal: Patient Will Transfer Sit To/From Stand Outcome: Progressing Flowsheets (Taken 09/19/2019 0956) Patient will transfer sit to/from stand:  with minimal assist  with moderate assist Goal: Pt Will Transfer Bed To Chair/Chair To Bed Outcome: Progressing Flowsheets (Taken 09/19/2019 0956) Pt will Transfer Bed to Chair/Chair to Bed:  with mod assist  with min assist Goal: Pt Will Ambulate Outcome: Progressing Flowsheets (Taken 09/19/2019 0956) Pt will Ambulate:  15 feet  with moderate assist  with rolling walker   9:56 AM, 09/19/19 Dominique Matthews, MPT Physical Therapist with [facility] [phone] office [phone] mobile phone

## 2019-09-19 NOTE — Progress Notes (Signed)
Pt awake at present, granddaughter at bedside. Pt has slept most of day, has required pain medication x3 so far this shift and nausea med x1. Pt states pain never really gets better. Have repositioned pt several times while up in chair and while in bed. Warm pack applied to left groin for pain relief, but pt states does not really help. Pt has refused to feed self today, states that she can't do it. Husband at bedside earlier today states pt feeds self at home, but he fed her breakfast today. Pt asked to be fed ice cream and fruit this afternoon.

## 2019-09-19 NOTE — Progress Notes (Signed)
Pt up on side of bed working with PT staff. Tolerated well. Pt then to MRI via stretcher.

## 2019-09-19 NOTE — TOC Initial Note (Signed)
Transition of Care [facility]) - Initial/Assessment Note    Patient Details  Name: Dominique Matthews MRN: [id number] Date of Birth: 1929-07-30  [facility]) CM/SW Contact:    Annice Needy, LCSW Phone Number: 09/19/2019, 3:15 PM  Clinical Narrative:                 Patient from home with husband. Husband helps her to complete ADLs.  Discussed PT recommendation. They have tried HHPT in the past and patient did not do it. Patient ambulates with a walker, however per husband she can barely walk. Mr. Oatley requested lift to help him get patient in the bed. Mr. Milhoan is unsure if he wants SNF at this time. Requested TOC phone number to call if he decided he wanted SNF.   Expected Discharge Plan: Home w Home Health Services Barriers to Discharge: Continued Medical Work up   Patient Goals and CMS Choice        Expected Discharge Plan and Services Expected Discharge Plan: Home w Home Health Services       Living arrangements for the past 2 months: Single Family Home                                      Prior Living Arrangements/Services Living arrangements for the past 2 months: Single Family Home Lives with:: Spouse Patient language and need for interpreter reviewed:: Yes Do you feel safe going back to the place where you live?: Yes      Need for Family Participation in Patient Care: Yes (Comment) Care giver support system in place?: Yes (comment) Current home services: DME Criminal Activity/Legal Involvement Pertinent to Current Situation/Hospitalization: No - Comment as needed  Activities of Daily Living Home Assistive Devices/Equipment: Walker (specify type) ADL Screening (condition at time of admission) Patient's cognitive ability adequate to safely complete daily activities?: No Is the patient deaf or have difficulty hearing?: No Does the patient have difficulty seeing, even when wearing glasses/contacts?: No Does the patient have difficulty  concentrating, remembering, or making decisions?: Yes Patient able to express need for assistance with ADLs?: Yes Does the patient have difficulty dressing or bathing?: Yes Independently performs ADLs?: No Communication: Independent Dressing (OT): Needs assistance Is this a change from baseline?: Change from baseline, expected to last <3days Grooming: Needs assistance Is this a change from baseline?: Change from baseline, expected to last <3 days Feeding: Appropriate for developmental age Bathing: Needs assistance Is this a change from baseline?: Change from baseline, expected to last <3 days Toileting: Needs assistance Is this a change from baseline?: Change from baseline, expected to last <3 days In/Out Bed: Needs assistance Is this a change from baseline?: Change from baseline, expected to last <3 days Walks in Home: Independent with device (comment) Does the patient have difficulty walking or climbing stairs?: Yes Weakness of Legs: Both Weakness of Arms/Hands: None  Permission Sought/Granted Permission sought to share information with : Family Supports    Share Information with NAME: Mr. Grange        Permission granted to share info w Contact Information: husband  Emotional Assessment     Affect (typically observed): Appropriate Orientation: : Oriented to Self Alcohol / Substance Use: Not Applicable Psych Involvement: No (comment)  Admission diagnosis:  Left groin pain [R10.32] Groin pain, chronic, left [R10.32, G89.29] Patient Active Problem List   Diagnosis Date Noted  . Groin pain, chronic,  left 09/18/2019  . Diverticulitis of colon 04/02/2014  . Dyslipidemia 11/16/2010  . Essential hypertension, benign 04/21/2009  . Coronary atherosclerosis 04/21/2009  . PALPITATIONS, HX OF 04/21/2009  . IRRITABLE BOWEL SYNDROME, HX OF 04/21/2009  . POSTSURG PERCUT TRANSLUMINAL COR ANGPLSTY STS 04/21/2009   PCP:  Selinda Flavin, MD Pharmacy:   [facility],  [place] - [address] [place] [address] Phone: [phone] Fax: [phone]     Social Determinants of Health (SDOH) Interventions    Readmission Risk Interventions No flowsheet data found.

## 2019-09-20 LAB — URINALYSIS, ROUTINE W REFLEX MICROSCOPIC
Bilirubin Urine: NEGATIVE
Glucose, UA: NEGATIVE mg/dL
Hgb urine dipstick: NEGATIVE
Ketones, ur: NEGATIVE mg/dL
Leukocytes,Ua: NEGATIVE
Nitrite: NEGATIVE
Protein, ur: NEGATIVE mg/dL
Specific Gravity, Urine: 1.016 (ref 1.005–1.030)
pH: 5 (ref 5.0–8.0)

## 2019-09-20 MED ORDER — ZINC OXIDE 40 % EX OINT
TOPICAL_OINTMENT | CUTANEOUS | Status: DC | PRN
  Filled 2019-09-20: qty 57

## 2019-09-20 NOTE — [place] FL2 (Signed)
[place] MEDICAID FL2 LEVEL OF CARE SCREENING TOOL     IDENTIFICATION  Patient Name: Dominique Matthews Birthdate: [birth year] Sex: female Admission Date (Current Location): 09/18/2019  [facility] and [place] Number:  [employer] and Address:  [facility],  [address], [place] [address]      Provider Number: [id number]  Attending Physician Name and Address:  Shon Hale, MD  Relative Name and Phone Number:       Current Level of Care: SNF Recommended Level of Care: Skilled Nursing Facility Prior Approval Number:    Date Approved/Denied:   PASRR Number: [id number]  Discharge Plan: SNF    Current Diagnoses: Patient Active Problem List   Diagnosis Date Noted   Groin pain, left 09/19/2019   Groin pain, chronic, left 09/18/2019   Diverticulitis of colon 04/02/2014   Dyslipidemia 11/16/2010   Essential hypertension, benign 04/21/2009   Coronary atherosclerosis 04/21/2009   PALPITATIONS, HX OF 04/21/2009   IRRITABLE BOWEL SYNDROME, HX OF 04/21/2009   POSTSURG PERCUT TRANSLUMINAL COR ANGPLSTY STS 04/21/2009    Orientation RESPIRATION BLADDER Height & Weight     Situation, Place, Self  Normal Continent Weight: 182 lb (82.6 kg) Height:  5\' 5"  (165.1 cm)  BEHAVIORAL SYMPTOMS/MOOD NEUROLOGICAL BOWEL NUTRITION STATUS      Continent Diet (Regular)  AMBULATORY STATUS COMMUNICATION OF NEEDS Skin   Extensive Assist Verbally Normal                       Personal Care Assistance Level of Assistance  Bathing, Feeding, Dressing Bathing Assistance: Limited assistance Feeding assistance: Independent Dressing Assistance: Limited assistance     Functional Limitations Info  Sight, Hearing, Speech Sight Info: Adequate Hearing Info: Adequate Speech Info: Adequate    SPECIAL CARE FACTORS FREQUENCY  PT (By licensed PT)     PT Frequency: 5x/week              Contractures Contractures Info: Not present    Additional Factors  Info  Code Status, Allergies Code Status Info: Full Allergies Info: Codeine, Morphine           Current Medications (09/20/2019):  This is the current hospital active medication list Current Facility-Administered Medications  Medication Dose Route Frequency Provider Last Rate Last Admin   acetaminophen (TYLENOL) tablet 650 mg  650 mg Oral Q6H PRN Emokpae, Ejiroghene E, MD   650 mg at 09/19/19 0427   Or   acetaminophen (TYLENOL) suppository 650 mg  650 mg Rectal Q6H PRN Emokpae, Ejiroghene E, MD       aspirin EC tablet 81 mg  81 mg Oral Q breakfast Emokpae, Courage, MD   81 mg at 09/20/19 0805   dicyclomine (BENTYL) capsule 20 mg  20 mg Oral BID Emokpae, Ejiroghene E, MD   20 mg at 09/20/19 1128   enoxaparin (LOVENOX) injection 40 mg  40 mg Subcutaneous QHS Emokpae, Ejiroghene E, MD   40 mg at 09/19/19 2240   gabapentin (NEURONTIN) capsule 300 mg  300 mg Oral TID Emokpae, Ejiroghene E, MD   300 mg at 09/20/19 1127   HYDROcodone-acetaminophen (NORCO) 7.5-325 MG per tablet 1 tablet  1 tablet Oral Q4H PRN 07-[birth year]1976, MD   1 tablet at 09/20/19 1311   HYDROmorphone (DILAUDID) injection 0.5 mg  0.5 mg Intravenous Q4H PRN Emokpae, Ejiroghene E, MD   0.5 mg at 09/20/19 0807   lisinopril (ZESTRIL) tablet 40 mg  40 mg Oral Daily Emokpae, 09/22/19, MD  40 mg at 09/20/19 1127   liver oil-zinc oxide (DESITIN) 40 % ointment   Topical PRN Emokpae, Courage, MD       methocarbamol (ROBAXIN) tablet 500 mg  500 mg Oral TID Mariea Clonts, Courage, MD   500 mg at 09/20/19 1130   metoprolol succinate (TOPROL-XL) 24 hr tablet 50 mg  50 mg Oral Daily Emokpae, Ejiroghene E, MD   50 mg at 09/20/19 1127   ondansetron (ZOFRAN) tablet 4 mg  4 mg Oral Q6H PRN Emokpae, Ejiroghene E, MD       Or   ondansetron (ZOFRAN) injection 4 mg  4 mg Intravenous Q6H PRN Emokpae, Ejiroghene E, MD   4 mg at 09/19/19 1411   pantoprazole (PROTONIX) EC tablet 40 mg  40 mg Oral Daily Emokpae, Ejiroghene E, MD   40 mg at  09/20/19 1127   polyethylene glycol (MIRALAX / GLYCOLAX) packet 17 g  17 g Oral Daily PRN Emokpae, Ejiroghene E, MD         Discharge Medications: Please see discharge summary for a list of discharge medications.  Relevant Imaging Results:  Relevant Lab Results:   Additional Information SSN [id number], Juleen China, LCSW

## 2019-09-20 NOTE — TOC Progression Note (Signed)
Transition of Care [facility]) - Progression Note    Patient Details  Name: Dominique Matthews MRN: [id number] Date of Birth: [birth year]  Transition of Care [facility]) CM/SW Contact  Annice Needy, LCSW Phone Number: 09/20/2019, 2:35 PM  Clinical Narrative:    Mr. Howton has reconsidered and would like patient to go to short term rehab. Referrals sent to facilities of request.    Expected Discharge Plan: Home w Home Health Services Barriers to Discharge: Continued Medical Work up  Expected Discharge Plan and Services Expected Discharge Plan: Home w Home Health Services       Living arrangements for the past 2 months: Single Family Home                                       Social Determinants of Health (SDOH) Interventions    Readmission Risk Interventions No flowsheet data found.

## 2019-09-20 NOTE — Progress Notes (Signed)
Patient Demographics:    Dominique Matthews, is a 86 y.o. female, DOB - December 23, 1929, TFT:[id number]  Admit date - 09/18/2019   Admitting Physician Blaire Palomino Denton Brick, MD  Outpatient Primary MD for the patient is Rory Percy, MD  LOS - 1   Chief Complaint  Patient presents with  . Groin Pain    Left        Subjective:    [name] today has no fevers, no emesis,  No chest pain,   -- Husband at bedside --Pain control improving  -Use IV Dilaudid as needed  -Appetite is not great  Assessment  & Plan :    Principal Problem:   Groin pain, chronic, left Active Problems:   Essential hypertension, benign   Coronary atherosclerosis   Groin pain, left  Brief Summary:-  86 year old  Female with medical history significant for HTN, Chronic Afib, h/o CVA, CAD and coronary artery disease, chronic pain syndrome on chronic pain meds from pain clinic admitted on 10/05/2018 now with worsening left groin pain and inability to perform ADLs safely at home -Patient and family agreeable to SNF placement  A/p 1)Lt Groin Pain--- in the setting of chronic pain syndrome, total CK, CRP and ESR are not elevated -MRI lumbar spine with foraminal narrowing at L5-S1 and multifactorial stenosis at L3-L4 and L4-L5 as well as multilevel facet hypertrophy--- no acute findings however -CT abdomen and pelvis and hip x-rays without acute findings -Physical therapy recommends SNF rehab due to severe pain and inability to perform ADLs -Pain control improving ---Use IV Dilaudid as needed --Continue Vicodin to 7.5/325 mg prn C/n methocarbamol   2)PAFib--PTA was not on anticoagulation, currently in sinus rhythm -Continue metoprolol for rate control/palpitations   3)Generalized Weakness and ambulatory dysfunction-due to significant left groin pain, physical therapist recommends SNF rehab, -Patient and family agreeable to SNF  placement  4)CAD--- status post prior angioplasty and stent placement in 1997, chest pain-free at this time no ACS type symptoms stable, continue metoprolol -Continue aspirin with food, hold off on statins due to myalgias  5) chronic pain syndrome including back pain--- please see MRI lumbar spine report below-----patient goes to pain clinic in [place], multiple injections in the past  6)HTN--stable, okay to continue Toprol-XL 50 mg daily and lisinopril  7)Social/Ethics--patient lives with her 55 year old husband, other for members live close by but patient is having difficulties with ADLs due to pain --Patient and family agreeable to SNF placement -She is a full code  Disposition/Need for in-Hospital Stay- patient unable to be discharged at this time due to ----Patient and family agreeable to SNF placement --Awaiting insurance authorization to go to SNF rehab Status is: Inpatient  Remains inpatient appropriate because:-Patient and family agreeable to SNF placement  Disposition: The patient is from: Home              Anticipated d/c is to: SNF              Anticipated d/c date is: 1 day              Patient currently is not medically stable to d/c. Barriers: Not Clinically Stable- --Patient and family agreeable to SNF placement --Awaiting insurance authorization to go to SNF rehab  Code Status : Full  Family Communication:   (patient is alert, awake and coherent) -husband  at bedside  Consults  :  na  DVT Prophylaxis  :  Lovenox -   SCDs   Lab Results  Component Value Date   PLT 200 09/18/2019    Inpatient Medications  Scheduled Meds: . aspirin EC  81 mg Oral Q breakfast  . dicyclomine  20 mg Oral BID  . enoxaparin (LOVENOX) injection  40 mg Subcutaneous QHS  . gabapentin  300 mg Oral TID  . lisinopril  40 mg Oral Daily  . methocarbamol  500 mg Oral TID  . metoprolol succinate  50 mg Oral Daily  . pantoprazole  40 mg Oral Daily   Continuous Infusions: PRN  Meds:.acetaminophen **OR** acetaminophen, HYDROcodone-acetaminophen, HYDROmorphone (DILAUDID) injection, liver oil-zinc oxide, ondansetron **OR** ondansetron (ZOFRAN) IV, polyethylene glycol    Anti-infectives (From admission, onward)   None        Objective:   Vitals:   09/19/19 2110 09/19/19 2145 09/20/19 0506 09/20/19 1505  BP:  (!) 154/81 125/73 119/70  Pulse:  89 79 71  Resp:    18  Temp:  98.5 F (36.9 C) 98.2 F (36.8 C) 99.2 F (37.3 C)  TempSrc:    Oral  SpO2: 92% 94% 93% 94%  Weight:      Height:        Wt Readings from Last 3 Encounters:  09/18/19 82.6 kg  07/23/19 81.6 kg  04/11/19 81 kg     Intake/Output Summary (Last 24 hours) at 09/20/2019 1901 Last data filed at 09/20/2019 0500 Gross per 24 hour  Intake --  Output 200 ml  Net -200 ml    Physical Exam  Gen:- Awake Alert, resting HEENT:- [place].AT, No sclera icterus Neck-Supple Neck,No JVD,.  Lungs-  CTAB , fair symmetrical air movement CV- S1, S2 normal, regular  Abd-  +ve B.Sounds, Abd Soft, No tenderness,    Extremity/Skin:- No  edema, pedal pulses present  Psych-affect is appropriate, oriented x3 Neuro-no new focal deficits, no tremors MSK-left groin area with significant pain/tenderness overlying skin without erythema warmth swelling ecchymosis or streaking   Data Review:   Micro Results Recent Results (from the past 240 hour(s))  SARS Coronavirus 2 by RT PCR (hospital order, performed in [facility] hospital lab) Nasopharyngeal Nasopharyngeal Swab     Status: None   Collection Time: 09/18/19  9:58 PM   Specimen: Nasopharyngeal Swab  Result Value Ref Range Status   SARS Coronavirus 2 NEGATIVE NEGATIVE Final    Comment: (NOTE) SARS-CoV-2 target nucleic acids are NOT DETECTED.  The SARS-CoV-2 RNA is generally detectable in upper and lower respiratory specimens during the acute phase of infection. The lowest concentration of SARS-CoV-2 viral copies this assay can detect is 250 copies /  mL. A negative result does not preclude SARS-CoV-2 infection and should not be used as the sole basis for treatment or other patient management decisions.  A negative result may occur with improper specimen collection / handling, submission of specimen other than nasopharyngeal swab, presence of viral mutation(s) within the areas targeted by this assay, and inadequate number of viral copies (<250 copies / mL). A negative result must be combined with clinical observations, patient history, and epidemiological information.  Fact Sheet for Patients:   StrictlyIdeas.no  Fact Sheet for Healthcare Providers: BankingDealers.co.za  This test is not yet approved or  cleared by the [place] FDA and has been authorized for detection and/or diagnosis of SARS-CoV-2 by FDA under an  Emergency Use Authorization (EUA).  This EUA will remain in effect (meaning this test can be used) for the duration of the COVID-19 declaration under Section 564(b)(1) of the Act, 21 U.S.C. section 360bbb-3(b)(1), unless the authorization is terminated or revoked sooner.  Performed at [facility], [address], [place], [place] [address]     Radiology Reports MR LUMBAR SPINE WO CONTRAST  Result Date: 09/19/2019 CLINICAL DATA:  Chronic back pain. Left groin pain radiating to the left leg and foot. EXAM: MRI LUMBAR SPINE WITHOUT CONTRAST TECHNIQUE: Multiplanar, multisequence MR imaging of the lumbar spine was performed. No intravenous contrast was administered. COMPARISON:  Radiography 01/16/2018.  CT abdomen 01/08/2016. FINDINGS: Segmentation:  5 lumbar type vertebral bodies. Alignment: 2 mm retrolisthesis T12-L1. 3 mm retrolisthesis L1-2. 2 mm anterolisthesis L4-5. Increase thoracolumbar kyphotic curvature and somewhat exaggerated lumbar lordosis. Vertebrae: Old augmented fracture at T12 which is healed. Old partial compression fracture at L1 affecting the inferior  endplate which is healed. Loss of height at T12 approximately 30%. Loss of height at L1 approximately 20-30%. Conus medullaris and cauda equina: Conus extends to the L1 level. Conus and cauda equina appear normal. Paraspinal and other soft tissues: Negative Disc levels: T11-12: Endplate osteophytes and mild bulging of the disc. Narrowing of the thecal sac slightly but no compressive stenosis. T12-L1: 2 mm retrolisthesis. Bulging of the disc. Narrowing of the thecal sac but no compressive stenosis. L1-2: 3 mm retrolisthesis. Bulging of the disc. Facet hypertrophy. Narrowing of both lateral recesses and neural foramina but without distinct neural compression. L2-3: Disc degeneration with endplate osteophytes and bulging of the disc. Facet and ligamentous hypertrophy. Lateral recess and foraminal stenosis right worse than left. Moderate central canal stenosis. Neural compression could possibly occur at this level, more likely on the right. L3-4: Bulging of the disc. Bilateral facet and ligamentous hypertrophy. Multifactorial spinal stenosis that could cause neural compression on either or both sides. L4-5: Bulging of the disc. Bilateral facet degeneration. 2 mm anterolisthesis. Stenosis of the lateral recesses and foramina that could cause neural compression on either side. L5-S1: No disc bulge or herniation. Left posterolateral endplate osteophytes and bulging of the disc. Facet degeneration and hypertrophy. Subarticular lateral recess and foraminal narrowing on the left that could possibly cause neural compression. IMPRESSION: Old healed augmented fracture at T12.  Old healed fracture at L1. L1-2: 3 mm retrolisthesis. Bulging of the disc. Facet hypertrophy. Stenosis of both lateral recesses and foramina. L2-3: Endplate osteophytes and bulging of the disc. Facet hypertrophy. Lateral recess and foraminal stenosis right worse than left that could be symptomatic. L3-4: Multifactorial stenosis that could be symptomatic.  L4-5: Multifactorial stenosis that could be symptomatic. L5-S1: Left lateral recess and foraminal narrowing because of encroachment by osteophyte in disc material that could cause left-sided neural compression. Electronically Signed   By: Nelson Chimes M.D.   On: 09/19/2019 09:04   CT ABDOMEN PELVIS W CONTRAST  Result Date: 09/18/2019 CLINICAL DATA:  Left inguinal pain for 3 days EXAM: CT ABDOMEN AND PELVIS WITH CONTRAST TECHNIQUE: Multidetector CT imaging of the abdomen and pelvis was performed using the standard protocol following bolus administration of intravenous contrast. CONTRAST:  119m OMNIPAQUE IOHEXOL 300 MG/ML  SOLN COMPARISON:  01/08/2016 FINDINGS: Lower chest: No acute pleural or parenchymal lung disease. Hiatal and left diaphragmatic hernia unchanged. Hepatobiliary: No focal liver abnormality is seen. Status post cholecystectomy. No biliary dilatation. Pancreas: Unremarkable. No pancreatic ductal dilatation or surrounding inflammatory changes. Spleen: Normal in size without focal abnormality. Adrenals/Urinary  Tract: Adrenal glands are unremarkable. Kidneys are normal, without renal calculi, focal lesion, or hydronephrosis. Bladder is unremarkable. Stomach/Bowel: No bowel obstruction or ileus. Diverticulosis of the sigmoid colon without diverticulitis. No bowel wall thickening or inflammatory change. Vascular/Lymphatic: Aortic atherosclerosis. No enlarged abdominal or pelvic lymph nodes. Reproductive: Uterus and bilateral adnexa are unremarkable. Other: Stable bilateral fat containing inguinal hernias. No free fluid or free gas. Musculoskeletal: There are chronic compression deformities at the thoracolumbar junction, with previous vertebral augmentation at T12. Chronic appearing compression deformity through the inferior endplate of the L1 vertebral body is new since previous imaging. No acute fractures. Reconstructed images demonstrate no additional findings. IMPRESSION: 1. No acute intra-abdominal  or intrapelvic process. 2. Sigmoid diverticulosis without diverticulitis. 3. Stable bilateral fat containing inguinal hernias as well as a large hiatal/left diaphragmatic hernia. 4. Aortic Atherosclerosis (ICD10-I70.0). Electronically Signed   By: Randa Ngo M.D.   On: 09/18/2019 17:25   DG Hip Unilat W or Wo Pelvis 2-3 Views Left  Result Date: 09/18/2019 CLINICAL DATA:  86 year old female with left hip and groin pain for 3 days, no known injury. EXAM: DG HIP (WITH OR WITHOUT PELVIS) 2-3V LEFT COMPARISON:  CT Abdomen and Pelvis 1655 hours today. Left hip series 04/11/2019. FINDINGS: Supine views. Excreted IV contrast in the urinary bladder from recent CT. Osteopenia. Femoral heads normally located, and pelvis and proximal femurs appear intact as on the earlier CT today. Symmetric hip joint space loss. No acute osseous abnormality identified. Stable visible bowel gas pattern. IMPRESSION: No acute osseous abnormality identified. Electronically Signed   By: Genevie Ann M.D.   On: 09/18/2019 21:42     CBC Recent Labs  Lab 09/18/19 1448  WBC 4.3  HGB 13.0  HCT 37.7  PLT 200  MCV 98.2  MCH 33.9  MCHC 34.5  RDW 11.8  LYMPHSABS 1.2  MONOABS 0.3  EOSABS 0.1  BASOSABS 0.0    Chemistries  Recent Labs  Lab 09/18/19 1448  NA 135  K 4.3  CL 97*  CO2 27  GLUCOSE 104*  BUN 16  CREATININE 0.90  CALCIUM 9.6  AST 15  ALT 16  ALKPHOS 58  BILITOT 1.2   ------------------------------------------------------------------------------------------------------------------ No results for input(s): CHOL, HDL, LDLCALC, TRIG, CHOLHDL, LDLDIRECT in the last 72 hours.  No results found for: HGBA1C ------------------------------------------------------------------------------------------------------------------ No results for input(s): TSH, T4TOTAL, T3FREE, THYROIDAB in the last 72 hours.  Invalid input(s):  FREET3 ------------------------------------------------------------------------------------------------------------------ No results for input(s): VITAMINB12, FOLATE, FERRITIN, TIBC, IRON, RETICCTPCT in the last 72 hours.  Coagulation profile No results for input(s): INR, PROTIME in the last 168 hours.  No results for input(s): DDIMER in the last 72 hours.  Cardiac Enzymes No results for input(s): CKMB, TROPONINI, MYOGLOBIN in the last 168 hours.  Invalid input(s): CK ------------------------------------------------------------------------------------------------------------------ No results found for: BNP   Roxan Hockey M.D on 09/20/2019 at 7:01 PM  Go to [email or web address] - for contact info  [facility] - Office  [phone]

## 2019-09-21 MED ORDER — TAMSULOSIN HCL 0.4 MG PO CAPS: 0.4000 mg | ORAL_CAPSULE | Freq: Every day | ORAL | 3 refills | Status: AC

## 2019-09-21 MED ORDER — BISACODYL 10 MG RE SUPP
10.0000 mg | Freq: Once | RECTAL | Status: AC
  Administered 2019-09-21: 10 mg via RECTAL

## 2019-09-21 MED ORDER — POLYETHYLENE GLYCOL 3350 17 G PO PACK: 17.0000 g | PACK | Freq: Every day | ORAL | 1 refills | Status: AC

## 2019-09-21 MED ORDER — METOPROLOL SUCCINATE ER 50 MG PO TB24: 50.0000 mg | ORAL_TABLET | Freq: Every day | ORAL | 3 refills | Status: AC

## 2019-09-21 MED ORDER — ZINC OXIDE 40 % EX OINT: TOPICAL_OINTMENT | CUTANEOUS | 0 refills | Status: AC

## 2019-09-21 MED ORDER — METHOCARBAMOL 500 MG PO TABS: 500.0000 mg | ORAL_TABLET | Freq: Three times a day (TID) | ORAL | 2 refills | Status: AC

## 2019-09-21 MED ORDER — HYDROCODONE-ACETAMINOPHEN 7.5-325 MG PO TABS: 1.0000 | ORAL_TABLET | ORAL | 0 refills | Status: AC | PRN

## 2019-09-21 MED ORDER — SENNOSIDES-DOCUSATE SODIUM 8.6-50 MG PO TABS: 2.0000 | ORAL_TABLET | Freq: Every day | ORAL | 1 refills | Status: AC

## 2019-09-21 MED ORDER — ENSURE COMPLETE PO LIQD: 237.0000 mL | Freq: Two times a day (BID) | ORAL | 2 refills | Status: AC

## 2019-09-21 MED ORDER — ASPIRIN 81 MG PO TBEC: 81.0000 mg | DELAYED_RELEASE_TABLET | Freq: Every day | ORAL | 11 refills | Status: AC

## 2019-09-21 MED ORDER — LISINOPRIL 40 MG PO TABS: 40.0000 mg | ORAL_TABLET | Freq: Every day | ORAL | 2 refills | Status: AC

## 2019-09-21 NOTE — Progress Notes (Addendum)
1215: pt yelling from room. Entered room and pt states, "I gotta poop!". Asked pt if she wanted to get OOB on BSC, pt states, "I can't do that!". Pt placed on bedpan. Pt had small, soft brown BM in bedpan.  States, "I gotta pee but it won't come out". Again, asked pt to get OOB on [facility] (as she has previously been able to urinate when sitting on BSC), pt states, "Oh honey, I can't do that." Lower abd distended, pt states discomfort with palpation of lower abd. Pt has not had any urine output so far this morning. Bladder scan performed showing 395 ml, 410 ml and 450 ml with each scan. I&O cath performed with 450 ml of dark yellow urine returned. Abd soft, pt with not further complaints of discomfort. While cleaning pt from catheterization, pt yells, "I gotta poop!" Pt began passing small amount soft brown stool. Pt cleaned, bed linens changed.  Pt set up for lunch, feeding self french fries that her husband brought for her and drinking tea. Pt then yells, "I gotta poop! Get me up! I want out of this bed!" Called for assistance and pt OOB to [facility] with 2 assist. Pt had another small soft brown BM. Pt cleaned and stood with 2 assist to get into chair and pt began to expell large amount of brown liquid stool incontinently into floor. Pt placed back on BSC to finish stooling.  Pt constantly yelling, "Come on now. Help me, help me." even with 2 staff members in the room. This nurse has been in the patient's room for > 1 hour at this point. Pt alert to person and place, but has confused and repetitive conversation. 1330: Pt off BSC with 2 assist and back in bed. Foley cath placed per MD Emopkae's order due to urinary retention. 1342: [facility] in to transport pt to [facility] [place]. Pt belongings in bag, sent with EMS. (Shoes and clothing, pack of chewing gum)

## 2019-09-21 NOTE — Discharge Instructions (Signed)
1)Follow-up with urologist Dr. Brock Ra 5 to 7 days for voiding trial and possible removal of Foley catheter 2)Repeat CBC and BMP within a week 3)Ensure supplements bid between meals 4)urinary retention -has foley and needs Voiding Trial (on Flomax)

## 2019-09-21 NOTE — Progress Notes (Signed)
1430: pt's glasses were left behind at discharge. Given to pt's granddaughter ChrisAnne to take to [facility] [place]. Granddaughter also given Hydrocodone rx printed at discharge to take to [facility] [place].

## 2019-09-21 NOTE — TOC Transition Note (Addendum)
Transition of Care [facility]) - CM/SW Discharge Note   Patient Details  Name: CHIANA WAMSER MRN: [id number] Date of Birth: 09/01/1929  Transition of Care [facility]) CM/SW Contact:  Barry Brunner, LCSW Phone Number: 09/21/2019, 12:58 PM   Clinical Narrative:    [facility] agreeable to take patient 09/21/2019. Family agreeable for [facility] SNF placement. The spouse of the patient reported that he is in route to bring [facility] a copy of patient's vaccination card. Med transport has been set up by CSW. EMS has been contacted to pick up patient. Nurse to call report. TOC signing off.     Barriers to Discharge: Continued Medical Work up   Patient Goals and CMS Choice        Discharge Placement                       Discharge Plan and Services                                     Social Determinants of Health (SDOH) Interventions     Readmission Risk Interventions No flowsheet data found.

## 2019-09-21 NOTE — Discharge Summary (Signed)
Dominique Matthews, is a 86 y.o. female  DOB 1930/01/18  MRN [id number].  Admission date:  09/18/2019  Admitting Physician  Esta Carmon Denton Brick, MD  Discharge Date:  09/21/2019   Primary MD  Rory Percy, MD  Recommendations for primary care physician for things to follow:   1)Follow-up with urologist Dr. Brain Hilts 5 to 7 days for voiding trial and possible removal of Foley catheter 2)Repeat CBC and BMP within a week 3)Ensure supplements bid between meals 4)urinary retention -has foley and needs Voiding Trial (on Flomax)  Admission Diagnosis  Left groin pain [R10.32] Groin pain, chronic, left [R10.32, G89.29] Groin pain, left [R10.32]   Discharge Diagnosis  Left groin pain [R10.32] Groin pain, chronic, left [R10.32, G89.29] Groin pain, left [R10.32]    Principal Problem:   Groin pain, chronic, left Active Problems:   Essential hypertension, benign   Coronary atherosclerosis   Groin pain, left      Past Medical History:  Diagnosis Date  . Atrial fibrillation ([place])   . CAD (coronary artery disease)   . Cerebrovascular disease    Nonobstructive  . Chronic anxiety   . Chronic benign neutropenia (H[facility])   . Diverticulitis   . Diverticulitis   . Dysrhythmia    AFib  . GERD (gastroesophageal reflux disease)   . History of supraventricular tachycardia   . Hypertension   . IBS (irritable bowel syndrome)   . Mixed dyslipidemia   . Obesity   . Osteoarthritis   . Palpitations   . PONV (postoperative nausea and vomiting)     Past Surgical History:  Procedure Laterality Date  . APPENDECTOMY    . BACK SURGERY    . Bone desinty  11/2015  . cardiac stent in 1997     at [facility]     Right; Bilateral trigger thumb release surgery  . CATARACT EXTRACTION W/PHACO Left 12/09/2017   Procedure: CATARACT EXTRACTION PHACO AND INTRAOCULAR LENS PLACEMENT LEFT EYE;   Surgeon: Baruch Goldmann, MD;  Location: [facility];  Service: Ophthalmology;  Laterality: Left;  CDE: 37.61  . CHOLECYSTECTOMY    . CORONARY ANGIOPLASTY     stent  . ESOPHAGOGASTRODUODENOSCOPY N/A 05/17/2014   Procedure: ESOPHAGOGASTRODUODENOSCOPY (EGD);  Surgeon: Rogene Houston, MD;  Location: [facility] SUITE;  Service: Endoscopy;  Laterality: N/A;  730  . KNEE ARTHROSCOPY Left   . THORACIC DISC SURGERY  11/03/2015   T 12  . TOTAL KNEE ARTHROPLASTY Right      HPI  from the history and physical done on the day of admission:   Chief Complaint: Left groin pain   HPI: Dominique Matthews is a 86 y.o. female with medical history significant for hypertension, atrial fibrillation, CVA, coronary artery disease, chronic pain. Patient presented to the ED with complaints of left groin pain ongoing over the past 3 days, that radiates down her left lower extremity.  Complains of some redness to her left heel.  Patient reported that she took a total of 3 tablets Q8h  of her oxycodone, without significant improvement in her pain.  She denies falls.  Patient has chronic back pain, which is uncontrolled with her home pain regimen.  She has had several back injections.  Spouse is present at bedside, he says he has to help patient out of bed in the mornings, but this is not new.  At baseline patient ambulates with a walker, and she is able to move around her house.  ED Course: Stable vitals.  Unremarkable CBC, CMP.  UA Not suggestive of infection.  EKG No change from prior.  Abdominal pelvic CT with contrast-negative for acute abnormalities, diverticulosis without diverticulitis.  In the ED , on ambulation, patient was only able to walk 4 steps with her walker, and required 2 person full assist.  Review of Systems: As per HPI all other systems reviewed and negative.    Hospital Course:    Brief Summary:- 86 year old  Female with medical history significant forHTN, Chronic Afib, h/o CVA, CAD and coronary artery  disease,chronic pain syndrome on chronic pain meds from pain clinic admitted on 10/05/2018 now with worsening left groin pain and inability to perform ADLs safely at home -Patient and family agreeable to SNF placement  A/p 1)Lt Groin Pain--- in the setting of chronic pain syndrome, total CK, CRP and ESR are not elevated -Pain improved with adjustments of narcotics and muscle relaxants -MRI lumbar spine with foraminal narrowing at L5-S1 and multifactorial stenosis at L3-L4 and L4-L5 as well as multilevel facet hypertrophy--- no acute findings however -CT abdomen and pelvis and hip x-rays without acute findings -Physical therapy recommends SNF rehab due to  pain and inability to perform ADLs -Pain control improving ---Continue Vicodin 7.5/325 mg prn C/n methocarbamol   2)PAFib--PTA was not on anticoagulation, currently in sinus rhythm -Continue metoprolol for rate control/palpitations  3)Generalized Weakness and ambulatory dysfunction-due to significant left groin pain, physical therapist recommends SNF rehab, -Patient and family agreeable to SNF placement  4)CAD--- status post prior angioplasty and stent placement in 1997, chest pain-free at this time no ACS type symptoms stable, continue metoprolol -Continue aspirin with food, hold off on statins due to myalgias  5) chronic pain syndrome including back pain--- please see MRI lumbar spine report below-----patient goes to pain clinic in [place], multiple injections in the past  6)HTN--stable, okay to continue Toprol-XL 50 mg daily and lisinopril  7)Social/Ethics--patient lives with her 72 year old husband, other for members live close by but patient is having difficulties with ADLs due to pain --Patient and family agreeable to SNF placement -She is a full code  8)Acute Urinary Retention--- suspect opiate induced urinary retention ----pt failed in and out cath, foley placed and started on flomax, outpt fu with urologist for  voiding trial within a week advised  Disposition------Patient and family agreeable to SNF placement --  Code Status : Full  Family Communication:   (patient is alert, awake and coherent) -husband  at bedside -Called and updated patient's granddaughter prior to discharge Consults  :  na  Discharge Condition: stable  Follow UP   Follow-up Information    Dahlstedt, Annie Main, MD. Schedule an appointment as soon as possible for a visit in 1 week(s).   Specialty: Urology Why: urinary retention -has foley and needs Voiding Trial (on Flomax) Contact information: [address] [place] [address] [phone]                Consults obtained - na  Diet and Activity recommendation:  As advised  Discharge  Instructions     Discharge Instructions    Call MD for:  difficulty breathing, headache or visual disturbances   Complete by: As directed    Call MD for:  persistant dizziness or light-headedness   Complete by: As directed    Call MD for:  persistant nausea and vomiting   Complete by: As directed    Call MD for:  severe uncontrolled pain   Complete by: As directed    Call MD for:  temperature >100.4   Complete by: As directed    Diet - low sodium heart healthy   Complete by: As directed    Discharge instructions   Complete by: As directed    1)Follow-up with urologist Dr. Brain Hilts 5 to 7 days for voiding trial and possible removal of Foley catheter 2)Repeat CBC and BMP within a week 3)Ensure supplements bid between meals 4)urinary retention -has foley and needs Voiding Trial (on Flomax)   Increase activity slowly   Complete by: As directed         Discharge Medications     Allergies as of 09/21/2019      Reactions   Codeine Other (See Comments)   Sweating   Morphine Other (See Comments)   Sweating      Medication List    STOP taking these medications   HYDROcodone-acetaminophen 5-325 MG tablet Commonly known as:  NORCO/VICODIN Replaced by: HYDROcodone-acetaminophen 7.5-325 MG tablet     TAKE these medications   acetaminophen 500 MG tablet Commonly known as: TYLENOL Take 500 mg by mouth every 6 (six) hours as needed for moderate pain or headache.   aspirin 81 MG EC tablet Take 1 tablet (81 mg total) by mouth daily with breakfast. Swallow whole. Start taking on: September 22, 2019   dicyclomine 10 MG capsule Commonly known as: BENTYL Take 20 mg by mouth 2 (two) times daily.   esomeprazole 20 MG capsule Commonly known as: NEXIUM Take 20 mg by mouth daily.   feeding supplement (ENSURE COMPLETE) Liqd Take 237 mLs by mouth 2 (two) times daily between meals. 1 can bid in between Meals   gabapentin 300 MG capsule Commonly known as: NEURONTIN Take 300 mg by mouth 3 (three) times daily.   HYDROcodone-acetaminophen 7.5-325 MG tablet Commonly known as: NORCO Take 1 tablet by mouth every 4 (four) hours as needed for moderate pain. Replaces: HYDROcodone-acetaminophen 5-325 MG tablet   lisinopril 40 MG tablet Commonly known as: ZESTRIL Take 1 tablet (40 mg total) by mouth daily.   liver oil-zinc oxide 40 % ointment Commonly known as: DESITIN Apply topically See admin instructions. Apply to inguinal folds- bid   methocarbamol 500 MG tablet Commonly known as: ROBAXIN Take 1 tablet (500 mg total) by mouth 3 (three) times daily.   metoprolol succinate 50 MG 24 hr tablet Commonly known as: TOPROL-XL Take 1 tablet (50 mg total) by mouth daily.   nitroGLYCERIN 0.4 MG SL tablet Commonly known as: NITROSTAT DISSOLVE 1 TAB UNDER TOUNGE FOR CHEST PAIN. MAY REPEAT EVERY 5 MINUTES FOR 3 DOSES. IF NO RELIEF CALL 911 OR GO TO ER What changed: See the new instructions.   ondansetron 4 MG tablet Commonly known as: ZOFRAN Take 1 tablet (4 mg total) by mouth every 6 (six) hours.   polyethylene glycol 17 g packet Commonly known as: MIRALAX / GLYCOLAX Take 17 g by mouth daily.   senna-docusate 8.6-50 MG  tablet Commonly known as: Senokot-S Take 2 tablets by mouth at bedtime.   tamsulosin 0.4  MG Caps capsule Commonly known as: Flomax Take 1 capsule (0.4 mg total) by mouth daily after supper.       Major procedures and Radiology Reports - PLEASE review detailed and final reports for all details, in brief -    MR LUMBAR SPINE WO CONTRAST  Result Date: 09/19/2019 CLINICAL DATA:  Chronic back pain. Left groin pain radiating to the left leg and foot. EXAM: MRI LUMBAR SPINE WITHOUT CONTRAST TECHNIQUE: Multiplanar, multisequence MR imaging of the lumbar spine was performed. No intravenous contrast was administered. COMPARISON:  Radiography 01/16/2018.  CT abdomen 01/08/2016. FINDINGS: Segmentation:  5 lumbar type vertebral bodies. Alignment: 2 mm retrolisthesis T12-L1. 3 mm retrolisthesis L1-2. 2 mm anterolisthesis L4-5. Increase thoracolumbar kyphotic curvature and somewhat exaggerated lumbar lordosis. Vertebrae: Old augmented fracture at T12 which is healed. Old partial compression fracture at L1 affecting the inferior endplate which is healed. Loss of height at T12 approximately 30%. Loss of height at L1 approximately 20-30%. Conus medullaris and cauda equina: Conus extends to the L1 level. Conus and cauda equina appear normal. Paraspinal and other soft tissues: Negative Disc levels: T11-12: Endplate osteophytes and mild bulging of the disc. Narrowing of the thecal sac slightly but no compressive stenosis. T12-L1: 2 mm retrolisthesis. Bulging of the disc. Narrowing of the thecal sac but no compressive stenosis. L1-2: 3 mm retrolisthesis. Bulging of the disc. Facet hypertrophy. Narrowing of both lateral recesses and neural foramina but without distinct neural compression. L2-3: Disc degeneration with endplate osteophytes and bulging of the disc. Facet and ligamentous hypertrophy. Lateral recess and foraminal stenosis right worse than left. Moderate central canal stenosis. Neural compression could possibly  occur at this level, more likely on the right. L3-4: Bulging of the disc. Bilateral facet and ligamentous hypertrophy. Multifactorial spinal stenosis that could cause neural compression on either or both sides. L4-5: Bulging of the disc. Bilateral facet degeneration. 2 mm anterolisthesis. Stenosis of the lateral recesses and foramina that could cause neural compression on either side. L5-S1: No disc bulge or herniation. Left posterolateral endplate osteophytes and bulging of the disc. Facet degeneration and hypertrophy. Subarticular lateral recess and foraminal narrowing on the left that could possibly cause neural compression. IMPRESSION: Old healed augmented fracture at T12.  Old healed fracture at L1. L1-2: 3 mm retrolisthesis. Bulging of the disc. Facet hypertrophy. Stenosis of both lateral recesses and foramina. L2-3: Endplate osteophytes and bulging of the disc. Facet hypertrophy. Lateral recess and foraminal stenosis right worse than left that could be symptomatic. L3-4: Multifactorial stenosis that could be symptomatic. L4-5: Multifactorial stenosis that could be symptomatic. L5-S1: Left lateral recess and foraminal narrowing because of encroachment by osteophyte in disc material that could cause left-sided neural compression. Electronically Signed   By: Nelson Chimes M.D.   On: 09/19/2019 09:04   CT ABDOMEN PELVIS W CONTRAST  Result Date: 09/18/2019 CLINICAL DATA:  Left inguinal pain for 3 days EXAM: CT ABDOMEN AND PELVIS WITH CONTRAST TECHNIQUE: Multidetector CT imaging of the abdomen and pelvis was performed using the standard protocol following bolus administration of intravenous contrast. CONTRAST:  124m OMNIPAQUE IOHEXOL 300 MG/ML  SOLN COMPARISON:  01/08/2016 FINDINGS: Lower chest: No acute pleural or parenchymal lung disease. Hiatal and left diaphragmatic hernia unchanged. Hepatobiliary: No focal liver abnormality is seen. Status post cholecystectomy. No biliary dilatation. Pancreas:  Unremarkable. No pancreatic ductal dilatation or surrounding inflammatory changes. Spleen: Normal in size without focal abnormality. Adrenals/Urinary Tract: Adrenal glands are unremarkable. Kidneys are normal, without renal calculi, focal lesion, or hydronephrosis.  Bladder is unremarkable. Stomach/Bowel: No bowel obstruction or ileus. Diverticulosis of the sigmoid colon without diverticulitis. No bowel wall thickening or inflammatory change. Vascular/Lymphatic: Aortic atherosclerosis. No enlarged abdominal or pelvic lymph nodes. Reproductive: Uterus and bilateral adnexa are unremarkable. Other: Stable bilateral fat containing inguinal hernias. No free fluid or free gas. Musculoskeletal: There are chronic compression deformities at the thoracolumbar junction, with previous vertebral augmentation at T12. Chronic appearing compression deformity through the inferior endplate of the L1 vertebral body is new since previous imaging. No acute fractures. Reconstructed images demonstrate no additional findings. IMPRESSION: 1. No acute intra-abdominal or intrapelvic process. 2. Sigmoid diverticulosis without diverticulitis. 3. Stable bilateral fat containing inguinal hernias as well as a large hiatal/left diaphragmatic hernia. 4. Aortic Atherosclerosis (ICD10-I70.0). Electronically Signed   By: Randa Ngo M.D.   On: 09/18/2019 17:25   DG Hip Unilat W or Wo Pelvis 2-3 Views Left  Result Date: 09/18/2019 CLINICAL DATA:  86 year old female with left hip and groin pain for 3 days, no known injury. EXAM: DG HIP (WITH OR WITHOUT PELVIS) 2-3V LEFT COMPARISON:  CT Abdomen and Pelvis 1655 hours today. Left hip series 04/11/2019. FINDINGS: Supine views. Excreted IV contrast in the urinary bladder from recent CT. Osteopenia. Femoral heads normally located, and pelvis and proximal femurs appear intact as on the earlier CT today. Symmetric hip joint space loss. No acute osseous abnormality identified. Stable visible bowel gas  pattern. IMPRESSION: No acute osseous abnormality identified. Electronically Signed   By: Genevie Ann M.D.   On: 09/18/2019 21:42    Micro Results    Recent Results (from the past 240 hour(s))  SARS Coronavirus 2 by RT PCR (hospital order, performed in [facility] hospital lab) Nasopharyngeal Nasopharyngeal Swab     Status: None   Collection Time: 09/18/19  9:58 PM   Specimen: Nasopharyngeal Swab  Result Value Ref Range Status   SARS Coronavirus 2 NEGATIVE NEGATIVE Final    Comment: (NOTE) SARS-CoV-2 target nucleic acids are NOT DETECTED.  The SARS-CoV-2 RNA is generally detectable in upper and lower respiratory specimens during the acute phase of infection. The lowest concentration of SARS-CoV-2 viral copies this assay can detect is 250 copies / mL. A negative result does not preclude SARS-CoV-2 infection and should not be used as the sole basis for treatment or other patient management decisions.  A negative result may occur with improper specimen collection / handling, submission of specimen other than nasopharyngeal swab, presence of viral mutation(s) within the areas targeted by this assay, and inadequate number of viral copies (<250 copies / mL). A negative result must be combined with clinical observations, patient history, and epidemiological information.  Fact Sheet for Patients:   StrictlyIdeas.no  Fact Sheet for Healthcare Providers: BankingDealers.co.za  This test is not yet approved or  cleared by the [place] FDA and has been authorized for detection and/or diagnosis of SARS-CoV-2 by FDA under an Emergency Use Authorization (EUA).  This EUA will remain in effect (meaning this test can be used) for the duration of the COVID-19 declaration under Section 564(b)(1) of the Act, 21 U.S.C. section 360bbb-3(b)(1), unless the authorization is terminated or revoked sooner.  Performed at [facility], [address],  [place], [place] [address]        Today   Subjective    Dominique Matthews today has difficulty with urination and BM due to opiate induced urinary retention or constipation -Finally had a BM with laxatives -In and out catheterization done, bladder scan shows urinary  retention persists, indwelling Foley placed          Patient has been seen and examined prior to discharge   Objective   Blood pressure 133/84, pulse 76, temperature 98.7 F (37.1 C), temperature source Oral, resp. rate 18, height _0  (1.651 m), weight 82.6 kg, SpO2 94 %.   Intake/Output Summary (Last 24 hours) at 09/21/2019 1405 Last data filed at 09/21/2019 1235 Gross per 24 hour  Intake --  Output 1160 ml  Net -1160 ml    Exam Gen:- Awake  , resting HEENT:- .AT, No sclera icterus Neck-Supple Neck,No JVD,.  Lungs-  CTAB , fair symmetrical air movement CV- S1, S2 normal, regular  Abd-  +ve B.Sounds, Abd Soft, No tenderness,    Extremity/Skin:- No  edema, pedal pulses present  Psych-affect is appropriate, oriented x3 Neuro-no new focal deficits, no tremors MSK-left groin area with improved pain/tenderness overlying skin without erythema warmth swelling ecchymosis or streaking   Data Review   CBC w Diff:  Lab Results  Component Value Date   WBC 4.3 09/18/2019   HGB 13.0 09/18/2019   HCT 37.7 09/18/2019   PLT 200 09/18/2019   LYMPHOPCT 27 09/18/2019   MONOPCT 8 09/18/2019   EOSPCT 1 09/18/2019   BASOPCT 1 09/18/2019    CMP:  Lab Results  Component Value Date   NA 135 09/18/2019   K 4.3 09/18/2019   CL 97 (L) 09/18/2019   CO2 27 09/18/2019   BUN 16 09/18/2019   CREATININE 0.90 09/18/2019   CREATININE 0.86 08/08/2014   PROT 7.5 09/18/2019   ALBUMIN 4.2 09/18/2019   BILITOT 1.2 09/18/2019   ALKPHOS 58 09/18/2019   AST 15 09/18/2019   ALT 16 09/18/2019  .   Total Discharge time is about 33 minutes  Roxan Hockey M.D on 09/21/2019 at 2:05 PM  Go to [email or web address] -  for contact  info  [facility] - Office  [phone]

## 2019-09-22 LAB — URINE CULTURE: Culture: >=100000 — AB

## 2019-10-04 DIAGNOSIS — R339 Retention of urine, unspecified: Secondary | ICD-10-CM | POA: Insufficient documentation

## 2019-10-04 NOTE — Progress Notes (Signed)
Subjective: 1. Urinary retention      Dominique Matthews is an 86 yo female who was hospitalized at [facility] from 7/13 to 7/16 for groin pain and was found to have acute retention.  A foley was placed and she was started on tamsulosin and is sent to see [place] for a voiding trial.   She has chronic back pain and is not a surgical candidate.  Prior to hospitalization she was able to void without difficulty.  She has had mild incontinence and no history of recent UTI's.  She has had no GU surgery. She is in rehab for generalized weakness.   She doesn't have focal neurologic deficits.  She is on pain meds and had some constipation but that is better after a suppository.    ROS:  ROS  Allergies  Allergen Reactions  . Codeine Other (See Comments)    Sweating  . Morphine Other (See Comments)    Sweating    Past Medical History:  Diagnosis Date  . Atrial fibrillation (HCC)   . CAD (coronary artery disease)   . Cerebrovascular disease    Nonobstructive  . Chronic anxiety   . Chronic benign neutropenia (HCC)   . Diverticulitis   . Diverticulitis   . Dysrhythmia    AFib  . GERD (gastroesophageal reflux disease)   . History of supraventricular tachycardia   . Hypertension   . IBS (irritable bowel syndrome)   . Mixed dyslipidemia   . Obesity   . Osteoarthritis   . Palpitations   . PONV (postoperative nausea and vomiting)     Past Surgical History:  Procedure Laterality Date  . APPENDECTOMY    . BACK SURGERY    . Bone desinty  11/2015  . cardiac stent in 1997     at [facility]  . CARPAL TUNNEL RELEASE     Right; Bilateral trigger thumb release surgery  . CATARACT EXTRACTION W/PHACO Left 12/09/2017   Procedure: CATARACT EXTRACTION PHACO AND INTRAOCULAR LENS PLACEMENT LEFT EYE;  Surgeon: Fabio Pierce, MD;  Location: [facility];  Service: Ophthalmology;  Laterality: Left;  CDE: 37.61  . CHOLECYSTECTOMY    . CORONARY ANGIOPLASTY     stent  . ESOPHAGOGASTRODUODENOSCOPY N/A 05/17/2014    Procedure: ESOPHAGOGASTRODUODENOSCOPY (EGD);  Surgeon: Malissa Hippo, MD;  Location: [facility];  Service: Endoscopy;  Laterality: N/A;  730  . KNEE ARTHROSCOPY Left   . THORACIC DISC SURGERY  11/03/2015   T 12  . TOTAL KNEE ARTHROPLASTY Right     Social History   Socioeconomic History  . Marital status: Married    Spouse name: Not on file  . Number of children: Not on file  . Years of education: Not on file  . Highest education level: Not on file  Occupational History  . Not on file  Tobacco Use  . Smoking status: Never Smoker  . Smokeless tobacco: Never Used  Vaping Use  . Vaping Use: Never used  Substance and Sexual Activity  . Alcohol use: No    Alcohol/week: 0.0 standard drinks  . Drug use: No  . Sexual activity: Yes    Birth control/protection: None  Other Topics Concern  . Not on file  Social History Narrative  . Not on file   Social Determinants of Health   Financial Resource Strain:   . Difficulty of Paying Living Expenses:   Food Insecurity:   . Worried About Programme researcher, broadcasting/film/video in the Last Year:   . [employer] of  Food in the Last Year:   Transportation Needs:   . Freight forwarder (Medical):   Marland Kitchen Lack of Transportation (Non-Medical):   Physical Activity:   . Days of Exercise per Week:   . Minutes of Exercise per Session:   Stress:   . Feeling of Stress :   Social Connections:   . Frequency of Communication with Friends and Family:   . Frequency of Social Gatherings with Friends and Family:   . Attends Religious Services:   . Active Member of Clubs or Organizations:   . Attends Banker Meetings:   Marland Kitchen Marital Status:   Intimate Partner Violence:   . Fear of Current or Ex-Partner:   . Emotionally Abused:   Marland Kitchen Physically Abused:   . Sexually Abused:     Family History  Problem Relation Age of Onset  . Coronary artery disease Other     Anti-infectives: Anti-infectives (From admission, onward)   None      Current  Outpatient Medications  Medication Sig Dispense Refill  . acetaminophen (TYLENOL) 500 MG tablet Take 500 mg by mouth every 6 (six) hours as needed for moderate pain or headache.     Marland Kitchen aspirin EC 81 MG EC tablet Take 1 tablet (81 mg total) by mouth daily with breakfast. Swallow whole. 30 tablet 11  . dicyclomine (BENTYL) 10 MG capsule Take 20 mg by mouth 2 (two) times daily.     Marland Kitchen esomeprazole (NEXIUM) 20 MG capsule Take 20 mg by mouth daily.     . feeding supplement, ENSURE COMPLETE, (ENSURE COMPLETE) LIQD Take 237 mLs by mouth 2 (two) times daily between meals. 1 can bid in between Meals 33295 mL 2  . gabapentin (NEURONTIN) 300 MG capsule Take 300 mg by mouth 3 (three) times daily.      Marland Kitchen HYDROcodone-acetaminophen (NORCO) 7.5-325 MG tablet Take 1 tablet by mouth every 4 (four) hours as needed for moderate pain. 15 tablet 0  . lisinopril (ZESTRIL) 40 MG tablet Take 1 tablet (40 mg total) by mouth daily. 30 tablet 2  . liver oil-zinc oxide (DESITIN) 40 % ointment Apply topically See admin instructions. Apply to inguinal folds- bid 56.7 g 0  . methocarbamol (ROBAXIN) 500 MG tablet Take 1 tablet (500 mg total) by mouth 3 (three) times daily. 90 tablet 2  . metoprolol succinate (TOPROL-XL) 50 MG 24 hr tablet Take 1 tablet (50 mg total) by mouth daily. 30 tablet 3  . nitroGLYCERIN (NITROSTAT) 0.4 MG SL tablet DISSOLVE 1 TAB UNDER TOUNGE FOR CHEST PAIN. MAY REPEAT EVERY 5 MINUTES FOR 3 DOSES. IF NO RELIEF CALL 911 OR GO TO ER (Patient taking differently: Place 0.4 mg under the tongue every 5 (five) minutes as needed for chest pain. ) 25 tablet 3  . ondansetron (ZOFRAN) 4 MG tablet Take 1 tablet (4 mg total) by mouth every 6 (six) hours. 10 tablet 0  . polyethylene glycol (MIRALAX / GLYCOLAX) 17 g packet Take 17 g by mouth daily. 30 each 1  . senna-docusate (SENOKOT-S) 8.6-50 MG tablet Take 2 tablets by mouth at bedtime. 60 tablet 1  . tamsulosin (FLOMAX) 0.4 MG CAPS capsule Take 1 capsule (0.4 mg total)  by mouth daily after supper. 30 capsule 3   No current facility-administered medications for this visit.     Objective: Vital signs in last 24 hours: BP (!) 98/63   Pulse 83   Ht 5\' 5"  (1.651 m)   Wt 182 lb (82.6 kg)  BMI 30.29 kg/m   Intake/Output from previous day: No intake/output data recorded. Intake/Output this shift: @IOTHISSHIFT @   Physical Exam  Lab Results:  No results found for this or any previous visit (from the past 24 hour(s)).  BMET No results for input(s): NA, K, CL, CO2, GLUCOSE, BUN, CREATININE, CALCIUM in the last 72 hours. PT/INR No results for input(s): LABPROT, INR in the last 72 hours. ABG No results for input(s): PHART, HCO3 in the last 72 hours.  Invalid input(s): PCO2, PO2  Studies/Results: No results found.   Assessment/Plan: Urinary retention It is not clear that she was truly in retention.   I will get the foley out today and if she is unable to void, it could be replaced in the nursing home.   She will return to see me in 6 weeks.    No orders of the defined types were placed in this encounter.    Orders Placed This Encounter  Procedures  . Urine Culture     Return in about 6 weeks (around 11/16/2019).    CC: Dr. Selinda Flavin.      Bjorn Pippin 10/05/2019 [phone]

## 2019-10-05 ENCOUNTER — Ambulatory Visit (INDEPENDENT_AMBULATORY_CARE_PROVIDER_SITE_OTHER): Payer: Medicare PPO | Admitting: Urology

## 2019-10-05 ENCOUNTER — Other Ambulatory Visit: Payer: Self-pay

## 2019-10-05 VITALS — BP 98/63 | HR 83 | Ht 65.0 in | Wt 182.0 lb

## 2019-10-05 DIAGNOSIS — R339 Retention of urine, unspecified: Secondary | ICD-10-CM

## 2019-10-05 NOTE — Assessment & Plan Note (Signed)
It is not clear that she was truly in retention.   I will get the foley out today and if she is unable to void, it could be replaced in the nursing home.   She will return to see me in 6 weeks.

## 2019-10-05 NOTE — Progress Notes (Signed)
Urological Symptom Review  Patient is experiencing the following symptoms: none   Review of Systems  Gastrointestinal (upper)  : Negative for upper GI symptoms  Gastrointestinal (lower) : Constipation  Constitutional : Night Sweats  Skin: Negative for skin symptoms  Eyes: Negative for eye symptoms  Ear/Nose/Throat : Negative for Ear/Nose/Throat symptoms  Hematologic/Lymphatic: Negative for Hematologic/Lymphatic symptoms  Cardiovascular : Leg swelling  Respiratory : Negative for respiratory symptoms  Endocrine: Negative for endocrine symptoms  Musculoskeletal: Back pain Joint pain  Neurological: Headaches Dizziness  Psychologic: Negative for psychiatric symptoms

## 2019-10-08 ENCOUNTER — Telehealth: Payer: Self-pay

## 2019-10-08 LAB — URINE CULTURE

## 2019-10-08 NOTE — Telephone Encounter (Signed)
-----   Message from Bjorn Pippin, MD sent at 10/08/2019  8:39 AM EDT ----- Keflex 250mg  po tid #21. ----- Message ----- From: , LPN Sent: Gustavus Messing   8:13 AM EDT To: 3/0/[id number], MD  Please review

## 2019-10-08 NOTE — Telephone Encounter (Signed)
Called and spoke with pt nurse Toniann Fail) at the [facility] in [place]. VO given for kelfex and copy of urine culture result along with WO faxed to faciity @ [phone].

## 2019-11-23 ENCOUNTER — Ambulatory Visit: Payer: Medicare PPO | Admitting: Urology

## 2020-05-01 ENCOUNTER — Telehealth: Payer: Self-pay | Admitting: Family Medicine

## 2020-05-01 NOTE — Telephone Encounter (Signed)
Called to schedule apt- pt is at the [facility] now.
